# Patient Record
Sex: Female | Born: 1955
Health system: Southern US, Community
[De-identification: ages and names within clinical notes are randomized; demographics above are authoritative.]

## PROBLEM LIST (undated history)

## (undated) DIAGNOSIS — Z923 Personal history of irradiation: Secondary | ICD-10-CM

## (undated) DIAGNOSIS — Z8489 Family history of other specified conditions: Secondary | ICD-10-CM

## (undated) DIAGNOSIS — F419 Anxiety disorder, unspecified: Secondary | ICD-10-CM

## (undated) DIAGNOSIS — R3915 Urgency of urination: Secondary | ICD-10-CM

## (undated) DIAGNOSIS — E119 Type 2 diabetes mellitus without complications: Secondary | ICD-10-CM

## (undated) DIAGNOSIS — G709 Myoneural disorder, unspecified: Secondary | ICD-10-CM

## (undated) DIAGNOSIS — I10 Essential (primary) hypertension: Secondary | ICD-10-CM

## (undated) DIAGNOSIS — Z9221 Personal history of antineoplastic chemotherapy: Secondary | ICD-10-CM

## (undated) DIAGNOSIS — C50919 Malignant neoplasm of unspecified site of unspecified female breast: Secondary | ICD-10-CM

## (undated) DIAGNOSIS — J45909 Unspecified asthma, uncomplicated: Secondary | ICD-10-CM

## (undated) DIAGNOSIS — L659 Nonscarring hair loss, unspecified: Secondary | ICD-10-CM

## (undated) DIAGNOSIS — C801 Malignant (primary) neoplasm, unspecified: Secondary | ICD-10-CM

## (undated) HISTORY — PX: DILATION AND CURETTAGE OF UTERUS: SHX78

## (undated) HISTORY — PX: AXILLARY LYMPH NODE BIOPSY: SHX5737

## (undated) HISTORY — PX: COLONOSCOPY: SHX174

## (undated) HISTORY — PX: ABDOMINAL HYSTERECTOMY: SHX81

---

## 2004-02-09 ENCOUNTER — Encounter: Admission: RE | Admit: 2004-02-09 | Discharge: 2004-02-09 | Payer: Self-pay | Admitting: Family Medicine

## 2004-02-16 ENCOUNTER — Ambulatory Visit (HOSPITAL_COMMUNITY): Admission: RE | Admit: 2004-02-16 | Discharge: 2004-02-16 | Payer: Self-pay | Admitting: Gastroenterology

## 2004-03-02 ENCOUNTER — Ambulatory Visit (HOSPITAL_COMMUNITY): Admission: RE | Admit: 2004-03-02 | Discharge: 2004-03-02 | Payer: Self-pay | Admitting: General Surgery

## 2004-03-15 ENCOUNTER — Ambulatory Visit (HOSPITAL_COMMUNITY): Admission: RE | Admit: 2004-03-15 | Discharge: 2004-03-15 | Payer: Self-pay | Admitting: Family Medicine

## 2004-04-14 ENCOUNTER — Encounter: Admission: RE | Admit: 2004-04-14 | Discharge: 2004-05-06 | Payer: Self-pay | Admitting: General Surgery

## 2004-07-21 ENCOUNTER — Encounter: Admission: RE | Admit: 2004-07-21 | Discharge: 2004-07-21 | Payer: Self-pay | Admitting: General Surgery

## 2004-12-21 ENCOUNTER — Encounter: Admission: RE | Admit: 2004-12-21 | Discharge: 2004-12-21 | Payer: Self-pay | Admitting: Family Medicine

## 2005-05-20 ENCOUNTER — Encounter: Admission: RE | Admit: 2005-05-20 | Discharge: 2005-05-20 | Payer: Self-pay | Admitting: Family Medicine

## 2005-06-02 ENCOUNTER — Ambulatory Visit (HOSPITAL_COMMUNITY): Admission: RE | Admit: 2005-06-02 | Discharge: 2005-06-02 | Payer: Self-pay | Admitting: Family Medicine

## 2010-07-24 ENCOUNTER — Encounter: Payer: Self-pay | Admitting: General Surgery

## 2011-02-09 ENCOUNTER — Emergency Department: Payer: Self-pay | Admitting: Internal Medicine

## 2014-05-14 ENCOUNTER — Other Ambulatory Visit: Payer: Self-pay | Admitting: Physician Assistant

## 2014-05-14 DIAGNOSIS — R2231 Localized swelling, mass and lump, right upper limb: Secondary | ICD-10-CM

## 2014-05-14 DIAGNOSIS — N6310 Unspecified lump in the right breast, unspecified quadrant: Secondary | ICD-10-CM

## 2014-05-23 ENCOUNTER — Other Ambulatory Visit: Payer: Self-pay

## 2014-05-26 ENCOUNTER — Other Ambulatory Visit: Payer: Self-pay | Admitting: Physician Assistant

## 2014-05-26 DIAGNOSIS — N6310 Unspecified lump in the right breast, unspecified quadrant: Secondary | ICD-10-CM

## 2014-05-28 ENCOUNTER — Encounter (HOSPITAL_COMMUNITY): Payer: Self-pay

## 2014-05-28 ENCOUNTER — Ambulatory Visit
Admission: RE | Admit: 2014-05-28 | Discharge: 2014-05-28 | Disposition: A | Discharge status: Home / Self Care | Payer: No Typology Code available for payment source | Source: Ambulatory Visit | Attending: Physician Assistant | Admitting: Physician Assistant

## 2014-05-28 ENCOUNTER — Ambulatory Visit (HOSPITAL_COMMUNITY)
Admission: RE | Admit: 2014-05-28 | Discharge: 2014-05-28 | Disposition: A | Discharge status: Home / Self Care | Payer: Medicaid Other | Source: Ambulatory Visit | Attending: Obstetrics and Gynecology | Admitting: Obstetrics and Gynecology

## 2014-05-28 ENCOUNTER — Other Ambulatory Visit: Payer: Self-pay | Admitting: Physician Assistant

## 2014-05-28 VITALS — BP 142/90 | Ht 67.0 in | Wt 245.4 lb

## 2014-05-28 DIAGNOSIS — R2231 Localized swelling, mass and lump, right upper limb: Secondary | ICD-10-CM

## 2014-05-28 DIAGNOSIS — N6310 Unspecified lump in the right breast, unspecified quadrant: Secondary | ICD-10-CM

## 2014-05-28 DIAGNOSIS — N6315 Unspecified lump in the right breast, overlapping quadrants: Secondary | ICD-10-CM

## 2014-05-28 DIAGNOSIS — Z1239 Encounter for other screening for malignant neoplasm of breast: Secondary | ICD-10-CM

## 2014-05-28 HISTORY — DX: Type 2 diabetes mellitus without complications: E11.9

## 2014-05-28 HISTORY — DX: Essential (primary) hypertension: I10

## 2014-05-28 NOTE — Progress Notes (Signed)
Complaints of right breast lump x 2 weeks that was sore when first noticed. Patient referred to [facility] by the [facility] due to recommending a right breast biopsy.  Pap Smear:  Pap smear not completed today. Last Pap smear was 10 years ago and normal per patient. Per patient had an abnormal Pap smear 20 years ago. Patient has a history of a complete hysterectomy 20 years after the abnormal Pap smear. Per patient had a history of AUB. Patient not sure if the hysterectomy was related to AUB or abnormal Pap smear. No Pap smear results in EPIC.  Physical exam: Breasts Breasts symmetrical. No skin abnormalities bilateral breasts. No nipple retraction bilateral breasts. No nipple discharge bilateral breasts. No lymphadenopathy left axilla. Lymphadenopathy right axilla. No lumps palpated left breast. Palpated a lump within the right breast at 12 o'clock above the areola and a lump within the right axilla. Complaints of right outer breast and axillary tenderness on exam. Referred patient to the [facility] for right breast biopsy per recommendation. Appointment scheduled for Wednesday, May 28, 2014 at 1000.      Pelvic/Bimanual No Pap smear completed today since patient has a history of a hysteretomy. Pap smear not indicated per BCCCP guidelines.

## 2014-05-28 NOTE — Patient Instructions (Signed)
Explained to Marissa Haney that she did not need a Pap smear today due to last Pap smear due to her history of a hysterectomy. Referred patient to the [facility] for right breast biopsy per recommendation. Appointment scheduled for Wednesday, May 28, 2014 at 1000. Patient aware of appointment and will be there. Marissa Haney verbalized understanding.  Marissa Haney, Arvil Chaco, RN 8:34 AM

## 2014-06-02 ENCOUNTER — Other Ambulatory Visit: Payer: Self-pay | Admitting: Physician Assistant

## 2014-06-02 DIAGNOSIS — T1590XA Foreign body on external eye, part unspecified, unspecified eye, initial encounter: Secondary | ICD-10-CM

## 2014-06-02 DIAGNOSIS — C50911 Malignant neoplasm of unspecified site of right female breast: Secondary | ICD-10-CM

## 2014-06-05 ENCOUNTER — Other Ambulatory Visit (INDEPENDENT_AMBULATORY_CARE_PROVIDER_SITE_OTHER): Payer: Self-pay

## 2014-06-05 DIAGNOSIS — C50911 Malignant neoplasm of unspecified site of right female breast: Secondary | ICD-10-CM

## 2014-06-06 ENCOUNTER — Telehealth: Payer: Self-pay | Admitting: *Deleted

## 2014-06-06 NOTE — Telephone Encounter (Signed)
Received referral from [facility].  Called pt and confirmed 06/11/14 appt w/ her.  Unable to mail before appt letter - gave verbal.  Unable to mail welcoming packet - gave instructions and directions.  Unable to mail intake form - placed a note for one to be given at time of check in.  Emailed [place] and [place] at [employer] to make them aware.  Added to spreadsheet.

## 2014-06-10 ENCOUNTER — Other Ambulatory Visit: Payer: Self-pay

## 2014-06-11 ENCOUNTER — Ambulatory Visit: Payer: Self-pay | Admitting: Hematology and Oncology

## 2014-06-11 ENCOUNTER — Ambulatory Visit: Payer: Self-pay

## 2014-06-11 ENCOUNTER — Telehealth: Payer: Self-pay | Admitting: *Deleted

## 2014-06-11 DIAGNOSIS — C50411 Malignant neoplasm of upper-outer quadrant of right female breast: Secondary | ICD-10-CM | POA: Insufficient documentation

## 2014-06-11 NOTE — Telephone Encounter (Signed)
Dr. Lindi Adie informed me that Marissa Haney did not show for her appt.  Called Marissa Haney and she is sick and thought that [place] would let us know since she changed the MRI appt.  Rescheduled and confirmed 06/17/14 appt w/ Marissa Haney.  Informed Dr. Lindi Adie.

## 2014-06-11 NOTE — Assessment & Plan Note (Signed)
Right breast invasive ductal carcinoma involving right axillary lymph node: 5.4 cm palpable mass T3, N1, M0 stage IIIa clinical stage ER 100%, PR 97%, HER-2 negative, Ki-67 22% grade 1/2 with lymphovascular invasion  Pathology and radiology review:Discussed with the patient, the details of pathology including the type of breast cancer,the clinical staging, the significance of ER, PR and HER-2/neu receptors and the implications for treatment. After reviewing the pathology in detail, we proceeded to discuss the different treatment options between surgery, radiation, chemotherapy, antiestrogen therapies.  Recommendation: Breast MRI has been scheduled for 06/13/2014. We discussed her case in the multidisciplinary tumor board. I recommended neoadjuvant chemotherapy followed by surgery and adjuvant radiation. I recommended neoadjuvant dose dense Adriamycin and Cytoxan x4 followed by weekly Taxol x12  Chemotherapy counseling: I have discussed the risks and benefits of chemotherapy including the risks of nausea/ vomiting, risk of infection from low WBC count, fatigue due to chemo or anemia, bruising or bleeding due to low platelets, mouth sores, loss/ change in taste and decreased appetite. Liver and kidney function will be monitored through out chemotherapy as abnormalities in liver and kidney function may be a side effect of treatment. Cardiac dysfunction due to Adriamycin was discussed in detail. Risk of permanent bone marrow dysfunction and leukemia due to chemo were also discussed.  Plan: 1. MRI Breast 2. Port 3. ECHO 4. Chemo class 5. Information regarding PREVENT trial and ALLIANCE trial  PREVENT trial: [id number] [phone]  Newly diagnosed breast cancer Stages 1-3 scheduled to receive anthracycline randomized to atorvastatin 40 mg or placebo once daily for 24 months along with 3 cardiac MRIs or 2 years paid for by the trial. I discussed the risks and benefits of atorvastatin including the risk of myopathy  and elevation of liver function tests. I explained the randomization process and patient is interested in the trial. I provided her with literature to read and provided her with the contact with the clinical trials nurse to ask any questions regarding the trial.  ALLIANCE [id number]: Phase 3 clinical trial in patients with node positive disease after neoadjuvant chemotherapy, if patient is positive for sentinel lymph node determined by intraoperative pathology on final pathology if axillary lymph node dissection was not performed, randomized to axillary lymph node dissection with nodal radiation versus axillary radiation and nodal radiation

## 2014-06-13 ENCOUNTER — Ambulatory Visit
Admission: RE | Admit: 2014-06-13 | Discharge: 2014-06-13 | Disposition: A | Discharge status: Home / Self Care | Payer: No Typology Code available for payment source | Source: Ambulatory Visit | Attending: Physician Assistant | Admitting: Physician Assistant

## 2014-06-13 DIAGNOSIS — T1590XA Foreign body on external eye, part unspecified, unspecified eye, initial encounter: Secondary | ICD-10-CM

## 2014-06-13 DIAGNOSIS — C50911 Malignant neoplasm of unspecified site of right female breast: Secondary | ICD-10-CM

## 2014-06-13 MED ORDER — GADOBENATE DIMEGLUMINE 529 MG/ML IV SOLN: 20.0000 mL | Freq: Once | INTRAVENOUS | Status: AC | PRN

## 2014-06-17 ENCOUNTER — Telehealth: Payer: Self-pay | Admitting: Hematology and Oncology

## 2014-06-17 ENCOUNTER — Encounter (HOSPITAL_COMMUNITY): Payer: Self-pay | Admitting: *Deleted

## 2014-06-17 ENCOUNTER — Encounter: Payer: Self-pay | Admitting: Hematology and Oncology

## 2014-06-17 ENCOUNTER — Ambulatory Visit (HOSPITAL_BASED_OUTPATIENT_CLINIC_OR_DEPARTMENT_OTHER): Payer: Medicaid Other | Admitting: Hematology and Oncology

## 2014-06-17 ENCOUNTER — Ambulatory Visit: Payer: Self-pay

## 2014-06-17 ENCOUNTER — Other Ambulatory Visit (INDEPENDENT_AMBULATORY_CARE_PROVIDER_SITE_OTHER): Payer: Self-pay | Admitting: General Surgery

## 2014-06-17 DIAGNOSIS — C773 Secondary and unspecified malignant neoplasm of axilla and upper limb lymph nodes: Secondary | ICD-10-CM

## 2014-06-17 DIAGNOSIS — Z170 Estrogen receptor positive status [ER+]: Secondary | ICD-10-CM

## 2014-06-17 DIAGNOSIS — C50411 Malignant neoplasm of upper-outer quadrant of right female breast: Secondary | ICD-10-CM

## 2014-06-17 NOTE — Progress Notes (Signed)
[facility] CONSULT NOTE  Patient Care Team: Cyndi Bender, PA-C as PCP - General (Physician Assistant)  CHIEF COMPLAINTS/PURPOSE OF CONSULTATION:  Newly diagnosed breast cancer  HISTORY OF PRESENTING ILLNESS:  Marissa Haney 58 y.o. female is here because of recent diagnosis of right-sided breast cancer. Patient had a palpable right breast lump that was initially treated with antibiotic and then sent her to mammogram that revealed a large mass 5.4 cm in size. She underwent a biopsy 05/28/2014 which came back as invasive ductal carcinoma with lymphovascular invasion is ER/PR positive HER-2 negative. Lymph node in the right axilla was also biopsy proven to be breast cancer. She underwent a breast MRI in 06/16/2014 that revealed 3.6 cm mass in the right breast with enlarged right axillary lymph node in addition there was a 5 mm oval enhancing mass in the central left breast thought to be an intramammary lymph node. Patient had seen Dr. Dalbert Batman with general surgery. She was presented the multidisciplinary tumor board and she is here today to discuss neoadjuvant treatment option.  I reviewed her records extensively and collaborated the history with the patient.  SUMMARY OF ONCOLOGIC HISTORY:   Breast cancer of upper-outer quadrant of right female breast   05/27/2014 Mammogram Right breast mass by mammogram 5.4 cm by ultrasound 4 x 4 X 2 cm   05/28/2014 Initial Biopsy Right breast biopsy 11:30: Invasive ductal carcinoma with lymphovascular invasion grade 1/2, ER 100%, PR 97%, Ki-67 22%, HER-2 negative ratio 1.26 average copy #1.7, lymph node biopsy also positive   06/16/2014 Breast MRI right breast cancer: 3.6 cm at 12:00 position mildly enlarged right axillary lymph node: 5 mm oval enhancing mass in the central left breast could be an intramammary lymph node    In terms of breast cancer risk profile:  She menarched at early age of 27 and went to menopause at age 35 She had 4  pregnancy, her first child was born at age 9   She has not received birth control pills She was never exposed to fertility medications or hormone replacement therapy.  She has no family history of Breast/GYN/GI cancer  Her sister-in-law was treated for breast cancer at [facility] with surgery followed by adjuvant chemotherapy  MEDICAL HISTORY:  Past Medical History  Diagnosis Date  . Hypertension   . Diabetes mellitus without complication     SURGICAL HISTORY: Past Surgical History  Procedure Laterality Date  . Abdominal hysterectomy    . Axillary lymph node biopsy      SOCIAL HISTORY: History   Social History  . Marital Status: Single    Spouse Name: N/A    Number of Children: N/A  . Years of Education: N/A   Occupational History  . Not on file.   Social History Main Topics  . Smoking status: Never Smoker   . Smokeless tobacco: Never Used  . Alcohol Use: No     Comment: ocassionally  . Drug Use: No  . Sexual Activity: Not Currently    Birth Control/ Protection: Surgical   Other Topics Concern  . Not on file   Social History Narrative    FAMILY HISTORY: Family History  Problem Relation Age of Onset  . Hypertension Mother     ALLERGIES:  has No Known Allergies.  MEDICATIONS:  Current Outpatient Prescriptions  Medication Sig Dispense Refill  . ALPRAZolam (XANAX) 0.25 MG tablet Take 0.25 mg by mouth 2 (two) times daily as needed for anxiety.    Marland Kitchen glipiZIDE (  GLUCOTROL) 5 MG tablet Take 5 mg by mouth 2 (two) times daily before a meal.    . meclizine (ANTIVERT) 25 MG tablet Take 25 mg by mouth.    . metFORMIN (GLUCOPHAGE) 500 MG tablet Take 500 mg by mouth.    Marland Kitchen lisinopril (PRINIVIL,ZESTRIL) 10 MG tablet Take 10 mg by mouth daily.     No current facility-administered medications for this visit.    REVIEW OF SYSTEMS:   Constitutional: Denies fevers, chills or abnormal night sweats Eyes: Denies blurriness of vision, double vision or watery eyes Ears,  nose, mouth, throat, and face: Denies mucositis or sore throat Respiratory: Denies cough, dyspnea or wheezes Cardiovascular: Denies palpitation, chest discomfort or lower extremity swelling Gastrointestinal:  Denies nausea, heartburn or change in bowel habits Skin: Denies abnormal skin rashes Lymphatics: Denies new lymphadenopathy or easy bruising Neurological:Denies numbness, tingling or new weaknesses Behavioral/Psych: Mood is stable, no new changes  Breast: Palpable lump right breast All other systems were reviewed with the patient and are negative.  PHYSICAL EXAMINATION: ECOG PERFORMANCE STATUS: 1 - Symptomatic but completely ambulatory  Filed Vitals:   06/17/14 1303  BP: 187/72  Pulse: 72  Temp: 98.5 F (36.9 C)  Resp: 18   Filed Weights   06/17/14 1303  Weight: 244 lb (110.678 kg)    GENERAL:alert, no distress and comfortable SKIN: skin color, texture, turgor are normal, no rashes or significant lesions EYES: normal, conjunctiva are pink and non-injected, sclera clear OROPHARYNX:no exudate, no erythema and lips, buccal mucosa, and tongue normal  NECK: supple, thyroid normal size, non-tender, without nodularity LYMPH:  no palpable lymphadenopathy in the cervical, axillary or inguinal LUNGS: clear to auscultation and percussion with normal breathing effort HEART: regular rate & rhythm and no murmurs and no lower extremity edema ABDOMEN:abdomen soft, non-tender and normal bowel sounds Musculoskeletal:no cyanosis of digits and no clubbing  PSYCH: alert & oriented x 3 with fluent speech NEURO: no focal motor/sensory deficits BREAST: Very large palpable lump in the right breast upper outer quadrant. No palpable axillary or supraclavicular lymphadenopathy  LABORATORY DATA:  I have reviewed the data as listed No results found for: WBC, HGB, HCT, MCV, PLT No results found for: NA, K, CL, CO2  RADIOGRAPHIC STUDIES: I have personally reviewed the radiological reports and  agreed with the findings in the report.  ASSESSMENT AND PLAN:  Breast cancer of upper-outer quadrant of right female breast  Right breast invasive ductal carcinoma 3.6 cm by MRI with enlarged right axillary lymph node biopsy proven to be positive for cancer , T2 N1 M0 stage IIB ER positive PR positive HER-2 negative Ki-67 22%  Pathology and radiology counseling:Discussed with the patient, the details of pathology including the type of breast cancer,the clinical staging, the significance of ER, PR and HER-2/neu receptors and the implications for treatment. After reviewing the pathology in detail, we proceeded to discuss the different treatment options between surgery, radiation, chemotherapy, antiestrogen therapies.  Recommendation: Based on tumor board discussion, we recommend neoadjuvant chemotherapy followed by surgery followed by radiation followed by antiestrogen therapy Chemotherapy: Dose dense Adriamycin and Cytoxan every 2 weeks 4 followed by Taxol weekly 12  Chemotherapy counseling:I have discussed the risks and benefits of chemotherapy including the risks of nausea/ vomiting, risk of infection from low WBC count, fatigue due to chemo or anemia, bruising or bleeding due to low platelets, mouth sores, loss/ change in taste and decreased appetite. Liver and kidney function will be monitored through out chemotherapy as abnormalities in liver  and kidney function may be a side effect of treatment. Cardiac dysfunction due to Adriamycin was discussed in detail. Risk of permanent bone marrow dysfunction and leukemia due to chemo were also discussed.  PREVENT trial: [facility] [id number]  I discussed with the patient the opportunity to participate in this trial. Newly diagnosed breast cancer Stages 1-3 scheduled to receive anthracycline randomized to atorvastatin 40 mg or placebo once daily for 24 months along with 3 cardiac MRIs or 2 years paid for by the trial. I discussed the risks and benefits of  atorvastatin including the risk of myopathy and elevation of liver function tests. I explained the randomization process and patient is interested in the trial. I provided her with literature to read and provided her with the contact with the clinical trials nurse to ask any questions regarding the trial.  Plan: 1. Port placement: Will request Dr. Dalbert Batman 2. Echocardiogram pre-Adriamycin chemotherapy 3. Chemotherapy class  Tentatively plan for chemotherapy on January 7.   All questions were answered. The patient knows to call the clinic with any problems, questions or concerns. I spent 55 minutes counseling the patient face to face. The total time spent in the appointment was 60 minutes and more than 50% was on counseling.     Rulon Eisenmenger, MD 06/17/2014 2:48 PM

## 2014-06-17 NOTE — Progress Notes (Signed)
Research contacted to connect with patient re: PRevent Trial.

## 2014-06-17 NOTE — Progress Notes (Signed)
Checked in new pt with no insurance.  Pt is approved with BCCCP until 05/29/15.  She is supposed to meet with Rolena Infante to fill out a Medicaid application.  I left vm for Sabrina letting her know the pt is here.

## 2014-06-17 NOTE — Assessment & Plan Note (Addendum)
Right breast invasive ductal carcinoma 3.6 cm by MRI with enlarged right axillary lymph node biopsy proven to be positive for cancer , T2 N1 M0 stage IIB ER positive PR positive HER-2 negative Ki-67 22%  Pathology and radiology counseling:Discussed with the patient, the details of pathology including the type of breast cancer,the clinical staging, the significance of ER, PR and HER-2/neu receptors and the implications for treatment. After reviewing the pathology in detail, we proceeded to discuss the different treatment options between surgery, radiation, chemotherapy, antiestrogen therapies.  Recommendation: Based on tumor board discussion, we recommend neoadjuvant chemotherapy followed by surgery followed by radiation followed by antiestrogen therapy Chemotherapy: Dose dense Adriamycin and Cytoxan every 2 weeks 4 followed by Taxol weekly 12  Chemotherapy counseling:I have discussed the risks and benefits of chemotherapy including the risks of nausea/ vomiting, risk of infection from low WBC count, fatigue due to chemo or anemia, bruising or bleeding due to low platelets, mouth sores, loss/ change in taste and decreased appetite. Liver and kidney function will be monitored through out chemotherapy as abnormalities in liver and kidney function may be a side effect of treatment. Cardiac dysfunction due to Adriamycin was discussed in detail. Risk of permanent bone marrow dysfunction and leukemia due to chemo were also discussed.  PREVENT trial: [facility] [id number]  I discussed with the patient the opportunity to participate in this trial. Newly diagnosed breast cancer Stages 1-3 scheduled to receive anthracycline randomized to atorvastatin 40 mg or placebo once daily for 24 months along with 3 cardiac MRIs or 2 years paid for by the trial. I discussed the risks and benefits of atorvastatin including the risk of myopathy and elevation of liver function tests. I explained the randomization process and patient is  interested in the trial. I provided her with literature to read and provided her with the contact with the clinical trials nurse to ask any questions regarding the trial.  Plan: 1. Port placement: Will request Dr. Dalbert Batman 2. Echocardiogram pre-Adriamycin chemotherapy 3. Chemotherapy class  Tentatively plan for chemotherapy on January 7.

## 2014-06-17 NOTE — Telephone Encounter (Signed)
per pof to sch pt appt-sent MW email to sch pt trmt-cld Christine  to inq about the BCCCP card-adv pt to come sign Mc[facility] papers w/Sabena-stated Mcaid would go back to retro-sch [facility] pt copy of sch

## 2014-06-17 NOTE — Progress Notes (Signed)
Patient ID: Marissa Haney, female   DOB: 08-13-1955, 58 y.o.   MRN: [id number] Medicaid Paperwork completed and faxed

## 2014-06-18 ENCOUNTER — Other Ambulatory Visit: Payer: Self-pay

## 2014-06-18 ENCOUNTER — Encounter (HOSPITAL_BASED_OUTPATIENT_CLINIC_OR_DEPARTMENT_OTHER): Payer: Self-pay | Admitting: *Deleted

## 2014-06-18 ENCOUNTER — Encounter (HOSPITAL_BASED_OUTPATIENT_CLINIC_OR_DEPARTMENT_OTHER)
Admission: RE | Admit: 2014-06-18 | Discharge: 2014-06-18 | Disposition: A | Discharge status: Home / Self Care | Payer: Medicaid Other | Source: Ambulatory Visit | Attending: General Surgery | Admitting: General Surgery

## 2014-06-18 ENCOUNTER — Ambulatory Visit
Admission: RE | Admit: 2014-06-18 | Discharge: 2014-06-18 | Disposition: A | Discharge status: Home / Self Care | Payer: No Typology Code available for payment source | Source: Ambulatory Visit | Attending: General Surgery | Admitting: General Surgery

## 2014-06-18 DIAGNOSIS — Z6837 Body mass index (BMI) 37.0-37.9, adult: Secondary | ICD-10-CM | POA: Diagnosis not present

## 2014-06-18 DIAGNOSIS — E119 Type 2 diabetes mellitus without complications: Secondary | ICD-10-CM | POA: Diagnosis not present

## 2014-06-18 DIAGNOSIS — Z01810 Encounter for preprocedural cardiovascular examination: Secondary | ICD-10-CM

## 2014-06-18 DIAGNOSIS — I10 Essential (primary) hypertension: Secondary | ICD-10-CM | POA: Diagnosis not present

## 2014-06-18 DIAGNOSIS — Z836 Family history of other diseases of the respiratory system: Secondary | ICD-10-CM | POA: Diagnosis not present

## 2014-06-18 DIAGNOSIS — R001 Bradycardia, unspecified: Secondary | ICD-10-CM

## 2014-06-18 DIAGNOSIS — Z6841 Body Mass Index (BMI) 40.0 and over, adult: Secondary | ICD-10-CM | POA: Diagnosis not present

## 2014-06-18 DIAGNOSIS — C50411 Malignant neoplasm of upper-outer quadrant of right female breast: Secondary | ICD-10-CM | POA: Diagnosis present

## 2014-06-18 DIAGNOSIS — Z8249 Family history of ischemic heart disease and other diseases of the circulatory system: Secondary | ICD-10-CM | POA: Diagnosis not present

## 2014-06-18 LAB — COMPREHENSIVE METABOLIC PANEL
ALT: 22 U/L (ref 0–35)
AST: 15 U/L (ref 0–37)
Albumin: 3.5 g/dL (ref 3.5–5.2)
Alkaline Phosphatase: 138 U/L — ABNORMAL HIGH (ref 39–117)
Anion gap: 12 (ref 5–15)
BUN: 11 mg/dL (ref 6–23)
CALCIUM: 9 mg/dL (ref 8.4–10.5)
CO2: 26 mEq/L (ref 19–32)
CREATININE: 0.67 mg/dL (ref 0.50–1.10)
Chloride: 105 mEq/L (ref 96–112)
GFR calc non Af Amer: >90 mL/min (ref >90)
GLUCOSE: 108 mg/dL — AB (ref 70–99)
Potassium: 3.6 mEq/L — ABNORMAL LOW (ref 3.7–5.3)
Sodium: 143 mEq/L (ref 137–147)
Total Bilirubin: 0.5 mg/dL (ref 0.3–1.2)
Total Protein: 7.7 g/dL (ref 6.0–8.3)

## 2014-06-18 LAB — CBC WITH DIFFERENTIAL/PLATELET
Basophils Absolute: 0 10*3/uL (ref 0.0–0.1)
Basophils Relative: 0 % (ref 0–1)
EOS PCT: 3 % (ref 0–5)
Eosinophils Absolute: 0.4 10*3/uL (ref 0.0–0.7)
HCT: 38 % (ref 36.0–46.0)
Hemoglobin: 12.6 g/dL (ref 12.0–15.0)
LYMPHS ABS: 4.9 10*3/uL — AB (ref 0.7–4.0)
Lymphocytes Relative: 41 % (ref 12–46)
MCH: 30.5 pg (ref 26.0–34.0)
MCHC: 33.2 g/dL (ref 30.0–36.0)
MCV: 92 fL (ref 78.0–100.0)
Monocytes Absolute: 0.6 10*3/uL (ref 0.1–1.0)
Monocytes Relative: 5 % (ref 3–12)
Neutro Abs: 6.1 10*3/uL (ref 1.7–7.7)
Neutrophils Relative %: 51 % (ref 43–77)
Platelets: 240 10*3/uL (ref 150–400)
RBC: 4.13 MIL/uL (ref 3.87–5.11)
RDW: 12.8 % (ref 11.5–15.5)
WBC: 12 10*3/uL — ABNORMAL HIGH (ref 4.0–10.5)

## 2014-06-18 NOTE — Progress Notes (Signed)
   06/18/14 0951  OBSTRUCTIVE SLEEP APNEA  Have you ever been diagnosed with sleep apnea through a sleep study? No  Do you snore loudly (loud enough to be heard through closed doors)?  1  Do you often feel tired, fatigued, or sleepy during the daytime? 0  Has anyone observed you stop breathing during your sleep? 0  Do you have, or are you being treated for high blood pressure? 1  BMI more than 35 kg/m2? 1  Age over 59 years old? 1  Neck circumference greater than 40 cm/16 inches? 1  Gender: 0  Obstructive Sleep Apnea Score 5  Score 4 or greater  Results sent to PCP

## 2014-06-18 NOTE — H&P (Signed)
Marissa Haney  Location: [facility] Patient #: [id number] DOB: 06-Nov-1955 Single / Language: Cleophus Molt / Race: Black or African American Female       History of Present Illness  Patient words: breast eval.  The patient is a 58 year old female who presents with breast cancer. This is a pleasant 58 year old Afro-American female from [place]. She was referred by Dr. Lovey Newcomer at the [facility] for initial surgical evaluation of a locally advanced cancer of the right breast. Cyndi Bender, PA, in [place] provides her primary care. She has not had any breast disease in the past. She recently woke up and felt a lump and a little bit of discomfort in the upper central right breast. She saw her PCP and was referred for mammography at [facility]. This revealed a 5 cm mass in the 12 o'clock position of the right breast which was also palpable with slight skin thickening on mammogram. Multiple right axillary masses were noted and were suspicious for metastatic disease to the right axillary lymph nodes. There was no evidence of malignancy in the left breast. She was referred to the breast center of [facility] and recently underwent biopsy of the primary mass in the right breast and one of the right axillary lymph nodes by Lovey Newcomer. . Both of these biopsies showed invasive ductal carcinoma with lymphovascular invasion. Breast diagnostic profile is pending. MRI is scheduled for December 8. She is definitely interested in breast conservation, if this is feasible. Past history is significant for excisional biopsy of a right axillary lymph node many years ago. She thinks I did this. This was benign. She had TAH and BSO 25 years ago for abnormal Pap smear but there was no cancer. BMI is 37. She has hypertension and type 2 diabetes. Family history is negative for breast or ovarian cancer. Mother died of congestive heart failure and father died of  complications of alcoholism The patient is single. She has a daughter age 52. She works part-time in [employer]. Denies tobacco. We had a long talk about her breast cancer specifically and treatment of breast cancer in general.  I told her that she needed to have the MRI next week.  We need to get the breast diagnostic profile.  She is to see medical oncology and possibly radiation oncology next week. I told her it might be possible to perform breast conservation surgery following neoadjuvant chemotherapy but we would have to wait and see what the MRI shows. She knows that if she has multifocal disease she would ultimately need a mastectomy.  I told her she may need radiation therapy regardless of the extent of surgery. I told her that if she was going to get chemotherapy that we would place a Port-A-Cath and we discussed that for some time as well. She seems to understand all of these issues fairly well and is willing to proceed with all planned investigations and consultations. I will see her back in 10-14 days after she has seen medical oncology.   Other Problems  Breast Cancer High blood pressure Lump In Breast  Past Surgical History  Breast Biopsy Right.  Diagnostic Studies History  Colonoscopy 5-10 years ago Mammogram within last year Pap Smear >5 years ago  Allergies  No Known Drug Allergies12/09/2013  Medication History  ALPRAZolam (0.25MG  Tablet, Oral) Active. GlipiZIDE (5MG  Tablet, Oral) Active. Lisinopril (10MG  Tablet, Oral) Active.  Social History  Alcohol use Occasional alcohol use. Illicit drug use  Prefer to discuss with provider. No caffeine use Tobacco use Never smoker.  Family History  Heart Disease Mother. Respiratory Condition Mother.  Pregnancy / Birth History  Age at menarche 19 years. Gravida 4 Irregular periods Maternal age 86-20 Para 3  Review of Systems General Present- Chills, Night Sweats and  Weight Loss. Not Present- Appetite Loss, Fatigue, Fever and Weight Gain. HEENT Present- Ringing in the Ears. Not Present- Earache, Hearing Loss, Hoarseness, Nose Bleed, Oral Ulcers, Seasonal Allergies, Sinus Pain, Sore Throat, Visual Disturbances, Wears glasses/contact lenses and Yellow Eyes. Respiratory Not Present- Bloody sputum, Chronic Cough, Difficulty Breathing, Snoring and Wheezing. Breast Present- Breast Mass. Not Present- Breast Pain, Nipple Discharge and Skin Changes. Cardiovascular Present- Shortness of Breath. Not Present- Chest Pain, Difficulty Breathing Lying Down, Leg Cramps, Palpitations, Rapid Heart Rate and Swelling of Extremities. Gastrointestinal Present- Nausea. Not Present- Abdominal Pain, Bloating, Bloody Stool, Change in Bowel Habits, Chronic diarrhea, Constipation, Difficulty Swallowing, Excessive gas, Gets full quickly at meals, Hemorrhoids, Indigestion, Rectal Pain and Vomiting. Female Genitourinary Not Present- Frequency, Nocturia, Painful Urination, Pelvic Pain and Urgency. Musculoskeletal Not Present- Back Pain, Joint Pain, Joint Stiffness, Muscle Pain, Muscle Weakness and Swelling of Extremities. Neurological Not Present- Decreased Memory, Fainting, Headaches, Numbness, Seizures, Tingling, Tremor, Trouble walking and Weakness. Psychiatric Present- Frequent crying. Not Present- Anxiety, Bipolar, Change in Sleep Pattern, Depression and Fearful. Endocrine Present- Hot flashes and New Diabetes. Not Present- Cold Intolerance, Excessive Hunger, Hair Changes and Heat Intolerance. Hematology Not Present- Easy Bruising, Excessive bleeding, Gland problems, HIV and Persistent Infections.   Vitals  06/05/2014 11:45 AM Weight: 239 lb Height: 67in Body Surface Area: 2.26 m Body Mass Index: 37.43 kg/m Temp.: 97.56F(Temporal)  Pulse: 75 (Regular)  BP: 132/76 (Sitting, Left Arm, Standard)    Physical Exam  General Mental Status-Alert. General  Appearance-Consistent with stated age. Hydration-Well hydrated. Voice-Normal. Note: BMI 37   Head and Neck Head-normocephalic, atraumatic with no lesions or palpable masses. Trachea-midline. Thyroid Gland Characteristics - normal size and consistency.  Eye Eyeball - Bilateral-Extraocular movements intact. Sclera/Conjunctiva - Bilateral-No scleral icterus.  Chest and Lung Exam Chest and lung exam reveals -quiet, even and easy respiratory effort with no use of accessory muscles and on auscultation, normal breath sounds, no adventitious sounds and normal vocal resonance. Inspection Chest Wall - Normal. Back - normal.  Breast Breast - Left-Symmetric, Non Tender, No Biopsy scars, no Dimpling, No Inflammation, No Lumpectomy scars, No Mastectomy scars, No Peau d' Orange. Breast - Right-Biopsy scar, Non Tender, No Dimpling, No Inflammation, No Lumpectomy scars, No Mastectomy scars, No Peau d' Orange. Note: At the 12 o'clock position, above the areolar margin there is a palpable mass, at least 5 cm transversely. The skin is healthy. There are minor ecchymoses. There is a scar in the right axilla from remote benign lymph node biopsy. I can't really feel the pathologically enlarged lymph nodes.  Cardiovascular Cardiovascular examination reveals -normal heart sounds, regular rate and rhythm with no murmurs and normal pedal pulses bilaterally.  Abdomen Inspection Inspection of the abdomen reveals - No Hernias. Skin - Scar - no surgical scars. Palpation/Percussion Palpation and Percussion of the abdomen reveal - Soft, Non Tender, No Rebound tenderness, No Rigidity (guarding) and No hepatosplenomegaly. Auscultation Auscultation of the abdomen reveals - Bowel sounds normal. Note: Well-healed Pfannenstiel incision.   Neurologic Neurologic evaluation reveals -alert and oriented x 3 with no impairment of recent or remote memory. Mental  Status-Normal.  Musculoskeletal Normal Exam - Left-Upper Extremity Strength Normal and Lower Extremity Strength  Normal. Normal Exam - Right-Upper Extremity Strength Normal and Lower Extremity Strength Normal.  Lymphatic Head & Neck  General Head & Neck Lymphatics: Bilateral - Description - Normal. Axillary  General Axillary Region: Bilateral - Description - Normal. Tenderness - Non Tender. Femoral & Inguinal  Generalized Femoral & Inguinal Lymphatics: Bilateral - Description - Normal. Tenderness - Non Tender.    Assessment & Plan  CANCER OF CENTRAL PORTION OF RIGHT BREAST (174.1  C50.111) Current Plans  Follow up in 2 weeks or as needed You have been diagnosed with a locally advanced cancer of the right breast. You have stated that she would like to be considered for breast conservation surgery if possible. That may or may not be possible but we will try. You are scheduled for breast MRI on December 8 You will be referred to a medical oncologist before any surgery is done to get their opinion You may be offered chemotherapy prior to surgery to shrink the tumor. To perform chemotherapy you may need a Port-A-Cath to be inserted, as we discussed. Return to see Dr. Dalbert Batman in 2 weeks after all of this is done and we will finalize our plans. BREAST CANCER METASTASIZED TO AXILLARY LYMPH NODE, RIGHT (174.9  C50.911) HYPERTENSION, BENIGN (401.1  I10) Impression: On lisinopril DIABETES TYPE 2, CONTROLLED (250.00  E11.9) Impression: On Glucotrol BMI 37.0-37.9, ADULT (V85.37  Z68.37) S/P TAH-BSO (V88.01  Z90.710) Impression: 25 years ago. Abnormal Pap smear. No cancer found.   Edsel Petrin. Dalbert Batman, M.D., [facility] General and Minimally invasive Surgery Breast and Colorectal Surgery Office:   [phone] Pager:   [phone]

## 2014-06-18 NOTE — Progress Notes (Addendum)
New patient intake form received from patient.  Chart updated - sent to scan.  MD note created during office visit.  - copy to pt.  Original to scan.

## 2014-06-18 NOTE — Progress Notes (Signed)
Pt added on this am-will come in for [facility] labs and cxr-

## 2014-06-19 ENCOUNTER — Ambulatory Visit (HOSPITAL_COMMUNITY): Payer: Medicaid Other

## 2014-06-19 ENCOUNTER — Ambulatory Visit (HOSPITAL_BASED_OUTPATIENT_CLINIC_OR_DEPARTMENT_OTHER): Payer: Medicaid Other | Admitting: Certified Registered"

## 2014-06-19 ENCOUNTER — Ambulatory Visit (HOSPITAL_BASED_OUTPATIENT_CLINIC_OR_DEPARTMENT_OTHER)
Admission: RE | Admit: 2014-06-19 | Discharge: 2014-06-19 | Disposition: A | Discharge status: Home / Self Care | Payer: Medicaid Other | Source: Ambulatory Visit | Attending: General Surgery | Admitting: General Surgery

## 2014-06-19 ENCOUNTER — Encounter (HOSPITAL_BASED_OUTPATIENT_CLINIC_OR_DEPARTMENT_OTHER): Payer: Self-pay | Admitting: Certified Registered"

## 2014-06-19 ENCOUNTER — Encounter (HOSPITAL_BASED_OUTPATIENT_CLINIC_OR_DEPARTMENT_OTHER)
Admission: RE | Disposition: A | Discharge status: Home / Self Care | Payer: Self-pay | Source: Ambulatory Visit | Attending: General Surgery

## 2014-06-19 DIAGNOSIS — E119 Type 2 diabetes mellitus without complications: Secondary | ICD-10-CM | POA: Insufficient documentation

## 2014-06-19 DIAGNOSIS — C50411 Malignant neoplasm of upper-outer quadrant of right female breast: Secondary | ICD-10-CM | POA: Diagnosis present

## 2014-06-19 DIAGNOSIS — Z836 Family history of other diseases of the respiratory system: Secondary | ICD-10-CM | POA: Insufficient documentation

## 2014-06-19 DIAGNOSIS — Z6837 Body mass index (BMI) 37.0-37.9, adult: Secondary | ICD-10-CM | POA: Diagnosis not present

## 2014-06-19 DIAGNOSIS — Z95828 Presence of other vascular implants and grafts: Secondary | ICD-10-CM

## 2014-06-19 DIAGNOSIS — Z6841 Body Mass Index (BMI) 40.0 and over, adult: Secondary | ICD-10-CM | POA: Insufficient documentation

## 2014-06-19 DIAGNOSIS — C50919 Malignant neoplasm of unspecified site of unspecified female breast: Secondary | ICD-10-CM

## 2014-06-19 DIAGNOSIS — Z01818 Encounter for other preprocedural examination: Secondary | ICD-10-CM

## 2014-06-19 DIAGNOSIS — I10 Essential (primary) hypertension: Secondary | ICD-10-CM | POA: Insufficient documentation

## 2014-06-19 DIAGNOSIS — Z8249 Family history of ischemic heart disease and other diseases of the circulatory system: Secondary | ICD-10-CM | POA: Insufficient documentation

## 2014-06-19 HISTORY — PX: PORTACATH PLACEMENT: SHX2246

## 2014-06-19 HISTORY — DX: Malignant (primary) neoplasm, unspecified: C80.1

## 2014-06-19 LAB — GLUCOSE, CAPILLARY
GLUCOSE-CAPILLARY: 117 mg/dL — AB (ref 70–99)
Glucose-Capillary: 109 mg/dL — ABNORMAL HIGH (ref 70–99)

## 2014-06-19 SURGERY — INSERTION, TUNNELED CENTRAL VENOUS DEVICE, WITH PORT
Anesthesia: General | Site: Chest | Laterality: Left

## 2014-06-19 MED ORDER — FENTANYL CITRATE 0.05 MG/ML IJ SOLN: 25.0000 ug | INTRAMUSCULAR | Status: DC | PRN

## 2014-06-19 MED ORDER — SODIUM CHLORIDE 0.9 % IJ SOLN: 3.0000 mL | Freq: Two times a day (BID) | INTRAMUSCULAR | Status: DC

## 2014-06-19 MED ORDER — HEPARIN (PORCINE) IN NACL 2-0.9 UNIT/ML-% IJ SOLN
INTRAMUSCULAR | Status: DC | PRN
  Administered 2014-06-19: 1 via INTRAVENOUS

## 2014-06-19 MED ORDER — ACETAMINOPHEN 325 MG PO TABS: 650.0000 mg | ORAL_TABLET | ORAL | Status: DC | PRN

## 2014-06-19 MED ORDER — HYDROMORPHONE HCL 1 MG/ML IJ SOLN
0.2500 mg | INTRAMUSCULAR | Status: DC | PRN
  Administered 2014-06-19: 0.5 mg via INTRAVENOUS

## 2014-06-19 MED ORDER — FENTANYL CITRATE 0.05 MG/ML IJ SOLN
INTRAMUSCULAR | Status: DC | PRN
  Administered 2014-06-19 (×3): 50 ug via INTRAVENOUS

## 2014-06-19 MED ORDER — LIDOCAINE HCL (CARDIAC) 20 MG/ML IV SOLN
INTRAVENOUS | Status: DC | PRN
  Administered 2014-06-19: 80 mg via INTRAVENOUS

## 2014-06-19 MED ORDER — CEFAZOLIN SODIUM-DEXTROSE 2-3 GM-% IV SOLR
2.0000 g | INTRAVENOUS | Status: AC
  Administered 2014-06-19: 2 g via INTRAVENOUS

## 2014-06-19 MED ORDER — ONDANSETRON HCL 4 MG/2ML IJ SOLN
INTRAMUSCULAR | Status: DC | PRN
  Administered 2014-06-19: 4 mg via INTRAVENOUS

## 2014-06-19 MED ORDER — HYDROMORPHONE HCL 1 MG/ML IJ SOLN
INTRAMUSCULAR | Status: AC
  Filled 2014-06-19: qty 1

## 2014-06-19 MED ORDER — FENTANYL CITRATE 0.05 MG/ML IJ SOLN: 50.0000 ug | INTRAMUSCULAR | Status: DC | PRN

## 2014-06-19 MED ORDER — BUPIVACAINE-EPINEPHRINE (PF) 0.5% -1:200000 IJ SOLN
INTRAMUSCULAR | Status: DC | PRN
  Administered 2014-06-19: 10 mL via PERINEURAL

## 2014-06-19 MED ORDER — MIDAZOLAM HCL 5 MG/5ML IJ SOLN
INTRAMUSCULAR | Status: DC | PRN
  Administered 2014-06-19: 2 mg via INTRAVENOUS

## 2014-06-19 MED ORDER — HEPARIN SOD (PORK) LOCK FLUSH 100 UNIT/ML IV SOLN
INTRAVENOUS | Status: DC | PRN
  Administered 2014-06-19: 400 [IU] via INTRAVENOUS

## 2014-06-19 MED ORDER — LACTATED RINGERS IV SOLN
INTRAVENOUS | Status: DC
  Administered 2014-06-19 (×2): via INTRAVENOUS

## 2014-06-19 MED ORDER — HEPARIN (PORCINE) IN NACL 2-0.9 UNIT/ML-% IJ SOLN
INTRAMUSCULAR | Status: AC
  Filled 2014-06-19: qty 500

## 2014-06-19 MED ORDER — SODIUM CHLORIDE 0.9 % IV SOLN: INTRAVENOUS | Status: DC

## 2014-06-19 MED ORDER — ACETAMINOPHEN 650 MG RE SUPP: 650.0000 mg | RECTAL | Status: DC | PRN

## 2014-06-19 MED ORDER — SODIUM BICARBONATE 4 % IV SOLN
INTRAVENOUS | Status: AC
  Filled 2014-06-19: qty 5

## 2014-06-19 MED ORDER — LIDOCAINE-EPINEPHRINE 1 %-1:100000 IJ SOLN
INTRAMUSCULAR | Status: AC
  Filled 2014-06-19: qty 1

## 2014-06-19 MED ORDER — SODIUM CHLORIDE 0.9 % IJ SOLN
3.0000 mL | INTRAMUSCULAR | Status: DC | PRN
Start: 2014-06-19 — End: 2014-06-19

## 2014-06-19 MED ORDER — MIDAZOLAM HCL 2 MG/2ML IJ SOLN: 1.0000 mg | INTRAMUSCULAR | Status: DC | PRN

## 2014-06-19 MED ORDER — DEXAMETHASONE SODIUM PHOSPHATE 4 MG/ML IJ SOLN
INTRAMUSCULAR | Status: DC | PRN
  Administered 2014-06-19: 4 mg via INTRAVENOUS

## 2014-06-19 MED ORDER — PROPOFOL 10 MG/ML IV BOLUS
INTRAVENOUS | Status: DC | PRN
  Administered 2014-06-19: 15 mg via INTRAVENOUS

## 2014-06-19 MED ORDER — BUPIVACAINE-EPINEPHRINE (PF) 0.5% -1:200000 IJ SOLN
INTRAMUSCULAR | Status: AC
  Filled 2014-06-19: qty 30

## 2014-06-19 MED ORDER — OXYCODONE HCL 5 MG PO TABS: 5.0000 mg | ORAL_TABLET | ORAL | Status: DC | PRN

## 2014-06-19 MED ORDER — HYDROCODONE-ACETAMINOPHEN 5-325 MG PO TABS: 1.0000 | ORAL_TABLET | Freq: Four times a day (QID) | ORAL | Status: DC | PRN

## 2014-06-19 MED ORDER — SODIUM CHLORIDE 0.9 % IV SOLN
250.0000 mL | INTRAVENOUS | Status: DC | PRN
Start: 2014-06-19 — End: 2014-06-19

## 2014-06-19 MED ORDER — HEPARIN SOD (PORK) LOCK FLUSH 100 UNIT/ML IV SOLN
INTRAVENOUS | Status: AC
  Filled 2014-06-19: qty 5

## 2014-06-19 MED ORDER — CHLORHEXIDINE GLUCONATE 4 % EX LIQD: 1.0000 "application " | Freq: Once | CUTANEOUS | Status: DC

## 2014-06-19 MED ORDER — PROMETHAZINE HCL 25 MG/ML IJ SOLN: 6.2500 mg | INTRAMUSCULAR | Status: DC | PRN

## 2014-06-19 SURGICAL SUPPLY — 60 items
APL SKNCLS STERI-STRIP NONHPOA (GAUZE/BANDAGES/DRESSINGS)
BAG DECANTER FOR FLEXI CONT (MISCELLANEOUS) ×3 IMPLANT
BENZOIN TINCTURE PRP APPL 2/3 (GAUZE/BANDAGES/DRESSINGS) IMPLANT
BLADE HEX COATED 2.75 (ELECTRODE) ×3 IMPLANT
BLADE SURG 15 STRL LF DISP TIS (BLADE) ×1 IMPLANT
BLADE SURG 15 STRL SS (BLADE) ×3
CANISTER SUCT 1200ML W/VALVE (MISCELLANEOUS) IMPLANT
CHLORAPREP W/TINT 26ML (MISCELLANEOUS) ×3 IMPLANT
CLOSURE WOUND 1/2 X4 (GAUZE/BANDAGES/DRESSINGS)
COVER BACK TABLE 60X90IN (DRAPES) ×3 IMPLANT
COVER MAYO STAND STRL (DRAPES) ×3 IMPLANT
COVER PROBE 5X48 (MISCELLANEOUS) ×3
DECANTER SPIKE VIAL GLASS SM (MISCELLANEOUS) IMPLANT
DRAPE C-ARM 42X72 X-RAY (DRAPES) ×3 IMPLANT
DRAPE LAPAROSCOPIC ABDOMINAL (DRAPES) ×3 IMPLANT
DRAPE UTILITY XL STRL (DRAPES) ×3 IMPLANT
DRSG TEGADERM 2-3/8X2-3/4 SM (GAUZE/BANDAGES/DRESSINGS) IMPLANT
DRSG TEGADERM 4X10 (GAUZE/BANDAGES/DRESSINGS) IMPLANT
DRSG TEGADERM 4X4.75 (GAUZE/BANDAGES/DRESSINGS) IMPLANT
ELECT REM PT RETURN 9FT ADLT (ELECTROSURGICAL) ×3
ELECTRODE REM PT RTRN 9FT ADLT (ELECTROSURGICAL) ×1 IMPLANT
GLOVE BIOGEL PI IND STRL 6.5 (GLOVE) IMPLANT
GLOVE BIOGEL PI IND STRL 7.0 (GLOVE) IMPLANT
GLOVE BIOGEL PI INDICATOR 6.5 (GLOVE) ×2
GLOVE BIOGEL PI INDICATOR 7.0 (GLOVE) ×2
GLOVE ECLIPSE 6.5 STRL STRAW (GLOVE) ×2 IMPLANT
GLOVE EUDERMIC 7 POWDERFREE (GLOVE) ×3 IMPLANT
GLOVE EXAM NITRILE MD LF STRL (GLOVE) ×2 IMPLANT
GOWN STRL REUS W/ TWL LRG LVL3 (GOWN DISPOSABLE) ×1 IMPLANT
GOWN STRL REUS W/ TWL XL LVL3 (GOWN DISPOSABLE) ×1 IMPLANT
GOWN STRL REUS W/TWL LRG LVL3 (GOWN DISPOSABLE) ×3
GOWN STRL REUS W/TWL XL LVL3 (GOWN DISPOSABLE) ×3
IV CATH PLACEMENT UNIT 16 GA (IV SOLUTION) IMPLANT
IV KIT MINILOC 20X1 SAFETY (NEEDLE) IMPLANT
KIT BARDPORT ISP (Port) IMPLANT
KIT CVR 48X5XPRB PLUP LF (MISCELLANEOUS) ×1 IMPLANT
KIT PORT POWER 8FR ISP CVUE (Catheter) ×3 IMPLANT
LIQUID BAND (GAUZE/BANDAGES/DRESSINGS) ×3 IMPLANT
NDL BLUNT 17GA (NEEDLE) IMPLANT
NDL HYPO 25X1 1.5 SAFETY (NEEDLE) ×1 IMPLANT
NEEDLE BLUNT 17GA (NEEDLE) IMPLANT
NEEDLE HYPO 22GX1.5 SAFETY (NEEDLE) ×3 IMPLANT
NEEDLE HYPO 25X1 1.5 SAFETY (NEEDLE) ×3 IMPLANT
PACK BASIN DAY SURGERY FS (CUSTOM PROCEDURE TRAY) ×3 IMPLANT
PENCIL BUTTON HOLSTER BLD 10FT (ELECTRODE) ×3 IMPLANT
SET SHEATH INTRODUCER 10FR (MISCELLANEOUS) IMPLANT
SHEATH COOK PEEL AWAY SET 9F (SHEATH) IMPLANT
SLEEVE SCD COMPRESS KNEE MED (MISCELLANEOUS) ×3 IMPLANT
SPONGE GAUZE 4X4 12PLY STER LF (GAUZE/BANDAGES/DRESSINGS) IMPLANT
STRIP CLOSURE SKIN 1/2X4 (GAUZE/BANDAGES/DRESSINGS) IMPLANT
SUT MNCRL AB 4-0 PS2 18 (SUTURE) ×3 IMPLANT
SUT PROLENE 2 0 CT2 30 (SUTURE) ×3 IMPLANT
SUT VICRYL 3-0 CR8 SH (SUTURE) ×3 IMPLANT
SYR 5ML LUER SLIP (SYRINGE) ×3 IMPLANT
SYRINGE 10CC LL (SYRINGE) ×3 IMPLANT
TOWEL OR 17X24 6PK STRL BLUE (TOWEL DISPOSABLE) ×6 IMPLANT
TOWEL OR NON WOVEN STRL DISP B (DISPOSABLE) ×1 IMPLANT
TUBE CONNECTING 20'X1/4 (TUBING)
TUBE CONNECTING 20X1/4 (TUBING) IMPLANT
YANKAUER SUCT BULB TIP NO VENT (SUCTIONS) IMPLANT

## 2014-06-19 NOTE — Discharge Instructions (Signed)
° ° °PORT-A-CATH: POST OP INSTRUCTIONS ° °Always review your discharge instruction sheet given to you by the facility where your surgery was performed.  ° °1. A prescription for pain medication may be given to you upon discharge. Take your pain medication as prescribed, if needed. If narcotic pain medicine is not needed, then you make take acetaminophen (Tylenol) or ibuprofen (Advil) as needed.  °2. Take your usually prescribed medications unless otherwise directed. °3. If you need a refill on your pain medication, please contact our office. All narcotic pain medicine now requires a paper prescription.  Phoned in and fax refills are no longer allowed by law.  Prescriptions will not be filled after 5 pm or on weekends.  °4. You should follow a light diet for the remainder of the day after your procedure. °5. Most patients will experience some mild swelling and/or bruising in the area of the incision. It may take several days to resolve. °6. It is common to experience some constipation if taking pain medication after surgery. Increasing fluid intake and taking a stool softener (such as Colace) will usually help or prevent this problem from occurring. A mild laxative (Milk of Magnesia or Miralax) should be taken according to package directions if there are no bowel movements after 48 hours.  °7. Unless discharge instructions indicate otherwise, you may remove your bandages 48 hours after surgery, and you may shower at that time. You may have steri-strips (small white skin tapes) in place directly over the incision.  These strips should be left on the skin for 7-10 days.  If your surgeon used Dermabond (skin glue) on the incision, you may shower in 24 hours.  The glue will flake off over the next 2-3 weeks.  °8. If your port is left accessed at the end of surgery (needle left in port), the dressing cannot get wet and should only by changed by a healthcare professional. When the port is no longer accessed (when the  needle has been removed), follow step 7.   °9. ACTIVITIES:  Limit activity involving your arms for the next 72 hours. Do no strenuous exercise or activity for 1 week. You may drive when you are no longer taking prescription pain medication, you can comfortably wear a seatbelt, and you can maneuver your car. °10.You may need to see your doctor in the office for a follow-up appointment.  Please °      check with your doctor.  °11.When you receive a new Port-a-Cath, you will get a product guide and  °      ID card.  Please keep them in case you need them. ° °WHEN TO CALL YOUR DOCTOR ([phone]): °1. Fever over 101.0 °2. Chills °3. Continued bleeding from incision °4. Increased redness and tenderness at the site °5. Shortness of breath, difficulty breathing ° ° °The clinic staff is available to answer your questions during regular business hours. Please don’t hesitate to call and ask to speak to one of the nurses or medical assistants for clinical concerns. If you have a medical emergency, go to the nearest emergency room or call 911.  A surgeon from [facility] is always on call at the hospital.  ° ° ° °For further information, please visit [email or web address] ° ° °Post Anesthesia Home Care Instructions ° °Activity: °Get plenty of rest for the remainder of the day. A responsible adult should stay with you for 24 hours following the procedure.  °For the next 24 hours, DO NOT: °-Drive a car °-  Operate machinery °-Drink alcoholic beverages °-Take any medication unless instructed by your physician °-Make any legal decisions or sign important papers. ° °Meals: °Start with liquid foods such as gelatin or soup. Progress to regular foods as tolerated. Avoid greasy, spicy, heavy foods. If nausea and/or vomiting occur, drink only clear liquids until the nausea and/or vomiting subsides. Call your physician if vomiting continues. ° °Special Instructions/Symptoms: °Your throat may feel dry or sore from the  anesthesia or the breathing tube placed in your throat during surgery. If this causes discomfort, gargle with warm salt water. The discomfort should disappear within 24 hours. ° ° ° ° ° ° °

## 2014-06-19 NOTE — Anesthesia Preprocedure Evaluation (Addendum)
Anesthesia Evaluation  Patient identified by MRN, date of birth, ID band Patient awake    Reviewed: Allergy & Precautions, H&P , NPO status , Patient's Chart, lab work & pertinent test results  History of Anesthesia Complications Negative for: history of anesthetic complications  Airway Mallampati: II  TM Distance: >3 FB Neck ROM: Full    Dental  (+) Teeth Intact   Pulmonary neg pulmonary ROS,  breath sounds clear to auscultation        Cardiovascular hypertension, Rhythm:Regular Rate:Normal     Neuro/Psych negative neurological ROS     GI/Hepatic negative GI ROS, Neg liver ROS,   Endo/Other  diabetesMorbid obesity  Renal/GU      Musculoskeletal   Abdominal   Peds  Hematology   Anesthesia Other Findings   Reproductive/Obstetrics                            Anesthesia Physical Anesthesia Plan  ASA: III  Anesthesia Plan: General   Post-op Pain Management:    Induction: Intravenous  Airway Management Planned: LMA  Additional Equipment:   Intra-op Plan:   Post-operative Plan: Extubation in OR  Informed Consent: I have reviewed the patients History and Physical, chart, labs and discussed the procedure including the risks, benefits and alternatives for the proposed anesthesia with the patient or authorized representative who has indicated his/her understanding and acceptance.     Plan Discussed with: CRNA and Surgeon  Anesthesia Plan Comments:         Anesthesia Quick Evaluation

## 2014-06-19 NOTE — Anesthesia Postprocedure Evaluation (Signed)
  Anesthesia Post-op Note  Patient: Marissa Haney  Procedure(s) Performed: Procedure(s): INSERTION PORT-A-CATH (Left)  Patient Location: PACU  Anesthesia Type:General  Level of Consciousness: awake and alert   Airway and Oxygen Therapy: Patient Spontanous Breathing  Post-op Pain: none  Post-op Assessment: Post-op Vital signs reviewed  Post-op Vital Signs: Reviewed  Last Vitals:  Filed Vitals:   06/19/14 1600  BP: 164/62  Pulse: 50  Temp:   Resp: 16    Complications: No apparent anesthesia complications

## 2014-06-19 NOTE — Op Note (Signed)
Patient Name:           Marissa Haney   Date of Surgery:        06/19/2014  Pre op Diagnosis:      Locally advanced cancer, right breast  Post op Diagnosis:    Same  Procedure:                 Insertion of 8 French power port clear view tunneled venous vascular access device Use of fluoroscopy for guidance and positioning  Surgeon:                     Edsel Petrin. Dalbert Batman, M.D., FACS  Assistant:                      OR staff  Operative Indications:    This is a pleasant 58 year old Afro-American female from [place]. She was referred by Dr. Lovey Newcomer at the [facility] [address] for initial surgical evaluation of a locally advanced cancer of the right breast. Cyndi Bender, PA, in [place] provides her primary care. She has not had any breast disease in the past. She recently woke up and felt a lump and a little bit of discomfort in the upper central right breast. She saw her PCP and was referred for mammography at [facility]. This revealed a 5 cm mass in the 12 o'clock position of the right breast which was also palpable with slight skin thickening on mammogram. Multiple right axillary masses were noted and were suspicious for metastatic disease to the right axillary lymph nodes. There was no evidence of malignancy in the left breast. She was referred to the breast center of [facility] and recently underwent biopsy of the primary mass in the right breast and one of the right axillary lymph nodes by Lovey Newcomer. . Both of these biopsies showed invasive ductal carcinoma with lymphovascular invasion. Receptor positive. HER-2 negative.  She is definitely interested in breast conservation, if this is feasible.She had TAH and BSO 25 years ago for abnormal Pap smear but there was no cancer. BMI is 37. She has hypertension and type 2 diabetes. Family history is negative for breast or ovarian cancer. We had a long talk about her breast cancer specifically and treatment of breast cancer in general.  . I told her it might be possible to perform breast conservation surgery following neoadjuvant chemotherapy but we would have to wait and see what the MRI shows. She has seen Dr. Ladona Mow who plans neoadjuvant chemotherapy. She knows that if she has multifocal disease she would ultimately need a mastectomy.  I told her she may need radiation therapy regardless of the extent of surgery. I told her that if she was going to get chemotherapy that we would place a Port-A-Cath and we discussed that for some time as well. Marland Kitchen  Operative Findings:       I was fortunately able to insert the Port-A-Cath through the left subclavian vein with a single pass. The tip of catheter appears to be in the superior vena cava near the right atrial junction. The catheter flushes well and has excellent blood return. It appears well-positioned by C-arm.  Procedure in Detail:          Following the induction of general LMA anesthesia the patient was positioned with a roll behind her shoulders and her arms at her sides. The neck and chest were prepped and draped in a sterile fashion. Intravenous antibiotics were given. Surgical  timeout was performed. 0.5% Marcaine with epinephrine was used as a local infiltration anesthetic.      A left subclavian venipuncture was performed. I got blood return on the first pass and threaded a wire into this. Vena cava under fluoroscopic guidance. Using fluoroscopy as a guide a marked a template on the chest wall for measuring the length of the catheter in positioning in this. Vena cava. Small incision was made at the wire insertion site. A 3 cm transverse incision was made a little more medially in the infraclavicular area. A subcutaneous pocket was created at the level of the pectoralis fascia. Using a tunneling device I passed the catheter from the port pocket site to the wire insertion site. Using the template marking the chest wall I measured the catheter and cut it 25 cm in length. The catheter was  then secured to the port with the locking device and flushed with heparinized saline. I passed the dilator and peel-away sheath over the guidewire into the central venous circulation. I removed the wire and the dilator and threaded the catheter easily through the peel-away sheath. I then removed the peel-away sheath. The catheter flushed easily and had excellent blood return. Fluoroscopy confirmed that the tip of the catheter was in the superior vena cava near the right atrial junction. There is no deformity or kinking of the catheter. The port and catheter were then flushed with concentrated heparin solution. The port was sutured to the pectoralis fascia with 3 interrupted sutures of 2-0 Prolene. The subcutaneous tissue was closed with 3-0 Vicryl and skin closed with subcuticular 4-0 Monocryl and Dermabond. The patient tolerated the procedure well was taken to PACU in stable condition. EBL 10 mL. Counts correct. Competitions none.     Edsel Petrin. Dalbert Batman, M.D., FACS General and Minimally Invasive Surgery Breast and Colorectal Surgery  06/19/2014 2:58 PM

## 2014-06-19 NOTE — Anesthesia Procedure Notes (Signed)
Procedure Name: LMA Insertion Date/Time: 06/19/2014 2:11 PM Performed by: Maryella Shivers Pre-anesthesia Checklist: Patient identified, Emergency Drugs available, Suction available and Patient being monitored Patient Re-evaluated:Patient Re-evaluated prior to inductionOxygen Delivery Method: Circle System Utilized Preoxygenation: Pre-oxygenation with 100% oxygen Intubation Type: IV induction Ventilation: Mask ventilation without difficulty LMA: LMA inserted LMA Size: 4.0 Number of attempts: 1 Airway Equipment and Method: bite block Placement Confirmation: positive ETCO2 Tube secured with: Tape Dental Injury: Teeth and Oropharynx as per pre-operative assessment

## 2014-06-19 NOTE — Transfer of Care (Signed)
Immediate Anesthesia Transfer of Care Note  Patient: Marissa Haney  Procedure(s) Performed: Procedure(s): INSERTION PORT-A-CATH (Left)  Patient Location: PACU  Anesthesia Type:General  Level of Consciousness: awake, alert  and oriented  Airway & Oxygen Therapy: Patient Spontanous Breathing and Patient connected to face mask oxygen  Post-op Assessment: Report given to PACU RN and Post -op Vital signs reviewed and stable  Post vital signs: Reviewed and stable  Complications: No apparent anesthesia complications

## 2014-06-19 NOTE — Interval H&P Note (Signed)
History and Physical Interval Note:  06/19/2014 1:51 PM  Marissa Haney  has presented today for surgery, with the diagnosis of breast cancer  The goals and the various methods of treatment have been discussed with the patient and family.  Risks were discussed in detail.  After consideration of risks, benefits and other options for treatment, the patient has consented to  Procedure(s): INSERTION PORT-A-CATH WITH ULTRASOUND (N/A) as a surgical intervention .  The patient's history has been reviewed, patient examined today, no change in status, stable for surgery.  I have reviewed the patient's chart and labs.  Questions were answered to the patient's satisfaction.     Adin Hector

## 2014-06-23 ENCOUNTER — Encounter (HOSPITAL_BASED_OUTPATIENT_CLINIC_OR_DEPARTMENT_OTHER): Payer: Self-pay | Admitting: General Surgery

## 2014-06-25 ENCOUNTER — Encounter: Payer: Self-pay | Admitting: *Deleted

## 2014-06-25 ENCOUNTER — Other Ambulatory Visit: Payer: No Typology Code available for payment source

## 2014-06-25 ENCOUNTER — Other Ambulatory Visit: Payer: Self-pay | Admitting: Hematology and Oncology

## 2014-06-25 ENCOUNTER — Encounter: Payer: No Typology Code available for payment source | Admitting: *Deleted

## 2014-06-25 ENCOUNTER — Ambulatory Visit (HOSPITAL_COMMUNITY)
Admission: RE | Admit: 2014-06-25 | Discharge: 2014-06-25 | Disposition: A | Discharge status: Home / Self Care | Payer: Medicaid Other | Source: Ambulatory Visit | Attending: Hematology and Oncology | Admitting: Hematology and Oncology

## 2014-06-25 DIAGNOSIS — E119 Type 2 diabetes mellitus without complications: Secondary | ICD-10-CM | POA: Insufficient documentation

## 2014-06-25 DIAGNOSIS — Z01818 Encounter for other preprocedural examination: Secondary | ICD-10-CM | POA: Diagnosis present

## 2014-06-25 DIAGNOSIS — C50411 Malignant neoplasm of upper-outer quadrant of right female breast: Secondary | ICD-10-CM | POA: Insufficient documentation

## 2014-06-25 DIAGNOSIS — I059 Rheumatic mitral valve disease, unspecified: Secondary | ICD-10-CM | POA: Diagnosis not present

## 2014-06-25 DIAGNOSIS — E785 Hyperlipidemia, unspecified: Secondary | ICD-10-CM | POA: Diagnosis not present

## 2014-06-25 NOTE — Progress Notes (Signed)
  Echocardiogram 2D Echocardiogram has been performed.  Diamond Nickel 06/25/2014, 9:52 AM

## 2014-06-30 ENCOUNTER — Telehealth: Payer: Self-pay

## 2014-06-30 NOTE — Telephone Encounter (Signed)
LMOVM - Returning pt call.  Pt to call clinic.

## 2014-07-01 ENCOUNTER — Other Ambulatory Visit: Payer: Self-pay | Admitting: Hematology and Oncology

## 2014-07-01 ENCOUNTER — Telehealth: Payer: Self-pay

## 2014-07-01 NOTE — Telephone Encounter (Signed)
Pt left msg 12/28 that she had fallen and fractured her wrist.   12/29 - per Dr Lindi Adie, if fracture has been treated/pt in cast she can be treated 1st chemo next wk.   Returned pt call - pt reports fractured 12/24.  Pt did not go to MD until 12/28 - arm was splinted and pt told to follow up with orthopedics.  Pt reported she has not gone to orthopedic - she was "waiting for [place] to let her know who to go to".  Let pt know I would call her back with orthopedic referral.  [facility]: they can get pt in but pt will need to pay $250 up front since she does not have insurance.    Called pt - does not have $250.  Has no insurance.  Reports medicaid application was signed and sent but she has not heard back yet.   Contacted Abigail/SW - Will ask [place] fund but does not believe they will cover the cost, recommended we contact outreach.  Contacted Dottie/Outreach - no info available and others associated with [facility] on PAL until Thursday.    LMOVM for patient with numbers for [place], and [place] with instructions to contact them about appt without requiring up front payment.  Pt to call clinic to advise appt d/t.

## 2014-07-03 ENCOUNTER — Telehealth: Payer: Self-pay

## 2014-07-03 NOTE — Telephone Encounter (Signed)
Returned pt call re: wrist not taken care of yet, do we have info on her medicaid.  Confirmed pt has not seen an orthopedic yet.  Pt denies receiving the msg this RN left on her VM 12/29 with contact information for orthopedic MDs.  Gave the patient this info again - names and phone numbers.  Advised pt she would have to call them to find one that would treat her without an upfront pymt.  Let pt know the person handling her medicaid is out of the office until next week and that she can contact her local DSS office to inquire about status.  Pt voiced understanding.

## 2014-07-08 ENCOUNTER — Other Ambulatory Visit: Payer: Self-pay | Admitting: *Deleted

## 2014-07-08 ENCOUNTER — Encounter: Payer: Self-pay | Admitting: *Deleted

## 2014-07-08 ENCOUNTER — Telehealth: Payer: Self-pay | Admitting: *Deleted

## 2014-07-08 DIAGNOSIS — C50411 Malignant neoplasm of upper-outer quadrant of right female breast: Secondary | ICD-10-CM

## 2014-07-08 MED ORDER — DEXAMETHASONE 4 MG PO TABS: ORAL_TABLET | ORAL | Status: DC

## 2014-07-08 MED ORDER — LIDOCAINE-PRILOCAINE 2.5-2.5 % EX CREA: TOPICAL_CREAM | CUTANEOUS | Status: DC

## 2014-07-08 MED ORDER — ONDANSETRON HCL 8 MG PO TABS: 8.0000 mg | ORAL_TABLET | Freq: Two times a day (BID) | ORAL | Status: DC | PRN

## 2014-07-08 MED ORDER — PROCHLORPERAZINE MALEATE 10 MG PO TABS: 10.0000 mg | ORAL_TABLET | Freq: Four times a day (QID) | ORAL | Status: DC | PRN

## 2014-07-08 MED ORDER — LORAZEPAM 0.5 MG PO TABS: 0.5000 mg | ORAL_TABLET | Freq: Four times a day (QID) | ORAL | Status: DC | PRN

## 2014-07-08 NOTE — Telephone Encounter (Signed)
Attempted to call patient yesterday and today to ask if wrist has been casted. Phone call does not go through stating incorrect code.

## 2014-07-10 ENCOUNTER — Other Ambulatory Visit (HOSPITAL_BASED_OUTPATIENT_CLINIC_OR_DEPARTMENT_OTHER): Payer: Medicaid Other

## 2014-07-10 ENCOUNTER — Ambulatory Visit (HOSPITAL_BASED_OUTPATIENT_CLINIC_OR_DEPARTMENT_OTHER): Payer: Medicaid Other | Admitting: Hematology and Oncology

## 2014-07-10 ENCOUNTER — Ambulatory Visit (HOSPITAL_BASED_OUTPATIENT_CLINIC_OR_DEPARTMENT_OTHER): Payer: Medicaid Other

## 2014-07-10 ENCOUNTER — Encounter: Payer: Self-pay | Admitting: *Deleted

## 2014-07-10 DIAGNOSIS — C773 Secondary and unspecified malignant neoplasm of axilla and upper limb lymph nodes: Secondary | ICD-10-CM

## 2014-07-10 DIAGNOSIS — Z170 Estrogen receptor positive status [ER+]: Secondary | ICD-10-CM

## 2014-07-10 DIAGNOSIS — Z5111 Encounter for antineoplastic chemotherapy: Secondary | ICD-10-CM

## 2014-07-10 DIAGNOSIS — C50411 Malignant neoplasm of upper-outer quadrant of right female breast: Secondary | ICD-10-CM

## 2014-07-10 LAB — CBC WITH DIFFERENTIAL/PLATELET
BASO%: 0.2 % (ref 0.0–2.0)
BASOS ABS: 0 10*3/uL (ref 0.0–0.1)
EOS%: 2.8 % (ref 0.0–7.0)
Eosinophils Absolute: 0.3 10*3/uL (ref 0.0–0.5)
HCT: 42.6 % (ref 34.8–46.6)
HEMOGLOBIN: 13.8 g/dL (ref 11.6–15.9)
LYMPH#: 3.9 10*3/uL — AB (ref 0.9–3.3)
LYMPH%: 35.7 % (ref 14.0–49.7)
MCH: 30.6 pg (ref 25.1–34.0)
MCHC: 32.4 g/dL (ref 31.5–36.0)
MCV: 94.5 fL (ref 79.5–101.0)
MONO#: 0.5 10*3/uL (ref 0.1–0.9)
MONO%: 4.6 % (ref 0.0–14.0)
NEUT%: 56.7 % (ref 38.4–76.8)
NEUTROS ABS: 6.2 10*3/uL (ref 1.5–6.5)
Platelets: 234 10*3/uL (ref 145–400)
RBC: 4.51 10*6/uL (ref 3.70–5.45)
RDW: 12.9 % (ref 11.2–14.5)
WBC: 10.9 10*3/uL — AB (ref 3.9–10.3)

## 2014-07-10 LAB — COMPREHENSIVE METABOLIC PANEL (CC13)
ALK PHOS: 148 U/L (ref 40–150)
ALT: 15 U/L (ref 0–55)
AST: 17 U/L (ref 5–34)
Albumin: 3.7 g/dL (ref 3.5–5.0)
Anion Gap: 9 mEq/L (ref 3–11)
BUN: 14.9 mg/dL (ref 7.0–26.0)
CALCIUM: 9 mg/dL (ref 8.4–10.4)
CO2: 30 mEq/L — ABNORMAL HIGH (ref 22–29)
Chloride: 102 mEq/L (ref 98–109)
Creatinine: 0.9 mg/dL (ref 0.6–1.1)
EGFR: 85 mL/min/{1.73_m2} — AB (ref >90)
GLUCOSE: 184 mg/dL — AB (ref 70–140)
Potassium: 3.7 mEq/L (ref 3.5–5.1)
Sodium: 141 mEq/L (ref 136–145)
Total Bilirubin: 0.74 mg/dL (ref 0.20–1.20)
Total Protein: 8 g/dL (ref 6.4–8.3)

## 2014-07-10 MED ORDER — SODIUM CHLORIDE 0.9 % IV SOLN
150.0000 mg | Freq: Once | INTRAVENOUS | Status: AC
  Administered 2014-07-10: 150 mg via INTRAVENOUS
  Filled 2014-07-10: qty 5

## 2014-07-10 MED ORDER — DEXAMETHASONE SODIUM PHOSPHATE 10 MG/ML IJ SOLN
INTRAMUSCULAR | Status: AC
  Filled 2014-07-10: qty 1

## 2014-07-10 MED ORDER — DEXAMETHASONE SODIUM PHOSPHATE 20 MG/5ML IJ SOLN
12.0000 mg | Freq: Once | INTRAMUSCULAR | Status: AC
  Administered 2014-07-10: 12 mg via INTRAVENOUS

## 2014-07-10 MED ORDER — SODIUM CHLORIDE 0.9 % IV SOLN
Freq: Once | INTRAVENOUS | Status: AC
  Administered 2014-07-10: 13:00:00 via INTRAVENOUS

## 2014-07-10 MED ORDER — SODIUM CHLORIDE 0.9 % IV SOLN
600.0000 mg/m2 | Freq: Once | INTRAVENOUS | Status: AC
  Administered 2014-07-10: 1360 mg via INTRAVENOUS
  Filled 2014-07-10: qty 68

## 2014-07-10 MED ORDER — SODIUM CHLORIDE 0.9 % IJ SOLN
10.0000 mL | INTRAMUSCULAR | Status: DC | PRN
  Administered 2014-07-10: 10 mL
  Filled 2014-07-10: qty 10

## 2014-07-10 MED ORDER — DOXORUBICIN HCL CHEMO IV INJECTION 2 MG/ML
60.0000 mg/m2 | Freq: Once | INTRAVENOUS | Status: AC
  Administered 2014-07-10: 136 mg via INTRAVENOUS
  Filled 2014-07-10: qty 68

## 2014-07-10 MED ORDER — METOPROLOL TARTRATE 50 MG PO TABS: 50.0000 mg | ORAL_TABLET | Freq: Two times a day (BID) | ORAL | Status: DC

## 2014-07-10 MED ORDER — DEXAMETHASONE SODIUM PHOSPHATE 20 MG/5ML IJ SOLN
INTRAMUSCULAR | Status: AC
  Filled 2014-07-10: qty 5

## 2014-07-10 MED ORDER — HEPARIN SOD (PORK) LOCK FLUSH 100 UNIT/ML IV SOLN
500.0000 [IU] | Freq: Once | INTRAVENOUS | Status: AC | PRN
  Administered 2014-07-10: 500 [IU]
  Filled 2014-07-10: qty 5

## 2014-07-10 MED ORDER — PALONOSETRON HCL INJECTION 0.25 MG/5ML
0.2500 mg | Freq: Once | INTRAVENOUS | Status: AC
  Administered 2014-07-10: 0.25 mg via INTRAVENOUS

## 2014-07-10 MED ORDER — PALONOSETRON HCL INJECTION 0.25 MG/5ML
INTRAVENOUS | Status: AC
  Filled 2014-07-10: qty 5

## 2014-07-10 NOTE — Progress Notes (Signed)
[place] Work  Clinical Social Work was referred by nurse for assessment of psychosocial needs due to financial concerns.  Clinical Social Worker met with patient at [facility] during her first infusion to offer support and assess for needs.  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 She currently receives $194 for food stamps and her medicaid is pending. CSW discussed resources through [facility] to assist and pt has connected with Raquel in Financial Counseling and was approved for the grant. CSW reviewed Cancer Care and other resources to assist with transportation, Pt and Family Support Team, etc. Pt plans to contact [place] and is aware of how to complete with CSW assistance when she receives the application. Pt reports to have good support from family and friends, finances are her biggest concern currently. CSW to follow and assist again next week.   Clinical Social Work interventions: Resource education Emotional support  Loren Racer, [place] Worker [place]  [place] Phone: [phone] Fax: [phone]

## 2014-07-10 NOTE — Assessment & Plan Note (Signed)
Right breast invasive ductal carcinoma 3.6 cm by MRI with enlarged right axillary lymph node biopsy proven to be positive for cancer , T2 N1 M0 stage IIB ER positive PR positive HER-2 negative Ki-67 22%  Current treatment:Dose dense Adriamycin and Cytoxan every 2 weeks 4 followed by Taxol weekly 12; today cycle 1 day 1  Chemotherapy monitoring: 1. Consent has been signed 2. I reviewed the antiemetic regimen with the patient 3. Chemotherapy education as been completed 4. Blood counts are adequate for treatment Return to clinic in 1 week for follow-up and toxicity check

## 2014-07-10 NOTE — Patient Instructions (Addendum)
[facility] Discharge Instructions for Patients Receiving Chemotherapy  Today you received the following chemotherapy agents Adriamycin, Cytoxan. You also received medications to prevent nausea including, AloxiI, Emend, and a steroid called decadron. The Emend should prevent  nausea for the next 2 days. On day 3 start your nausea pill , Ondansetron every 8 hours as needed and compazine every 6 hours if needed. The ondansetron and compazine work differently in your body so you can alternate them.To help prevent nausea and vomiting after your treatment, we encourage you to take your nausea medication as prescribed. If you have any questions please call and ask for Dr Geralyn Flash nurse.     If you develop nausea and vomiting that is not controlled by your nausea medication, call the clinic.   BELOW ARE SYMPTOMS THAT SHOULD BE REPORTED IMMEDIATELY:  *FEVER GREATER THAN 100.5 F  *CHILLS WITH OR WITHOUT FEVER  NAUSEA AND VOMITING THAT IS NOT CONTROLLED WITH YOUR NAUSEA MEDICATION  *UNUSUAL SHORTNESS OF BREATH  *UNUSUAL BRUISING OR BLEEDING  TENDERNESS IN MOUTH AND THROAT WITH OR WITHOUT PRESENCE OF ULCERS  *URINARY PROBLEMS  *BOWEL PROBLEMS  UNUSUAL RASH Items with * indicate a potential emergency and should be followed up as soon as possible.  Feel free to call the clinic you have any questions or concerns. The clinic phone number is [phone].   Pegfilgrastim injection What is this medicine? PEGFILGRASTIM (peg fil GRA stim) is a long-acting granulocyte colony-stimulating factor that stimulates the growth of neutrophils, a type of white blood cell important in the body's fight against infection. It is used to reduce the incidence of fever and infection in patients with certain types of cancer who are receiving chemotherapy that affects the bone marrow. This medicine may be used for other purposes; ask your health care provider or pharmacist if you have questions. COMMON  BRAND NAME(S): Neulasta What should I tell my health care provider before I take this medicine? They need to know if you have any of these conditions: -latex allergy -ongoing radiation therapy -sickle cell disease -skin reactions to acrylic adhesives (On-Body Injector only) -an unusual or allergic reaction to pegfilgrastim, filgrastim, other medicines, foods, dyes, or preservatives -pregnant or trying to get pregnant -breast-feeding How should I use this medicine? This medicine is for injection under the skin. If you get this medicine at home, you will be taught how to prepare and give the pre-filled syringe or how to use the On-body Injector. Refer to the patient Instructions for Use for detailed instructions. Use exactly as directed. Take your medicine at regular intervals. Do not take your medicine more often than directed. It is important that you put your used needles and syringes in a special sharps container. Do not put them in a trash can. If you do not have a sharps container, call your pharmacist or healthcare provider to get one. Talk to your pediatrician regarding the use of this medicine in children. Special care may be needed. Overdosage: If you think you have taken too much of this medicine contact a poison control center or emergency room at once. NOTE: This medicine is only for you. Do not share this medicine with others. What if I miss a dose? It is important not to miss your dose. Call your doctor or health care professional if you miss your dose. If you miss a dose due to an On-body Injector failure or leakage, a new dose should be administered as soon as possible using a single prefilled  syringe for manual use. What may interact with this medicine? Interactions have not been studied. Give your health care provider a list of all the medicines, herbs, non-prescription drugs, or dietary supplements you use. Also tell them if you smoke, drink alcohol, or use illegal drugs. Some  items may interact with your medicine. This list may not describe all possible interactions. Give your health care provider a list of all the medicines, herbs, non-prescription drugs, or dietary supplements you use. Also tell them if you smoke, drink alcohol, or use illegal drugs. Some items may interact with your medicine. What should I watch for while using this medicine? You may need blood work done while you are taking this medicine. If you are going to need a MRI, CT scan, or other procedure, tell your doctor that you are using this medicine (On-Body Injector only). What side effects may I notice from receiving this medicine? Side effects that you should report to your doctor or health care professional as soon as possible: -allergic reactions like skin rash, itching or hives, swelling of the face, lips, or tongue -dizziness -fever -pain, redness, or irritation at site where injected -pinpoint red spots on the skin -shortness of breath or breathing problems -stomach or side pain, or pain at the shoulder -swelling -tiredness -trouble passing urine Side effects that usually do not require medical attention (report to your doctor or health care professional if they continue or are bothersome): -bone pain -muscle pain This list may not describe all possible side effects. Call your doctor for medical advice about side effects. You may report side effects to FDA at 1-800-FDA-1088. Where should I keep my medicine? Keep out of the reach of children. Store pre-filled syringes in a refrigerator between 2 and 8 degrees C (36 and 46 degrees F). Do not freeze. Keep in carton to protect from light. Throw away this medicine if it is left out of the refrigerator for more than 48 hours. Throw away any unused medicine after the expiration date. NOTE: This sheet is a summary. It may not cover all possible information. If you have questions about this medicine, talk to your doctor, pharmacist, or health  care provider.  2015, Elsevier/Gold Standard. (2013-09-19 16:14:05)

## 2014-07-10 NOTE — Progress Notes (Signed)
Patient Care Team: Cyndi Bender, PA-C as PCP - General (Physician Assistant)  DIAGNOSIS: No matching staging information was found for the patient.  SUMMARY OF ONCOLOGIC HISTORY:   Breast cancer of upper-outer quadrant of right female breast   05/27/2014 Mammogram Right breast mass by mammogram 5.4 cm by ultrasound 4 x 4 X 2 cm   05/28/2014 Initial Biopsy Right breast biopsy 11:30: Invasive ductal carcinoma with lymphovascular invasion grade 1/2, ER 100%, PR 97%, Ki-67 22%, HER-2 negative ratio 1.26 average copy #1.7, lymph node biopsy also positive   06/16/2014 Breast MRI right breast cancer: 3.6 cm at 12:00 position mildly enlarged right axillary lymph node: 5 mm oval enhancing mass in the central left breast could be an intramammary lymph node   07/10/2014 -  Neo-Adjuvant Chemotherapy Dose dense Adriamycin and Cytoxan 4 followed by Taxol 12   07/10/2014 Procedure PREVENT clinical trial (Lipitor vs Placebo) in patients undergoing cardiotoxic chemotherapy    CHIEF COMPLIANT: Cycle 1 day 1 of dose dense Adriamycin and Cytoxan  INTERVAL HISTORY: Marissa Haney is a 59 year old lady with above-mentioned history of right-sided breast cancer who is here to start neoadjuvant chemotherapy with dose dense Adriamycin and Cytoxan. Port has been placed and she had undergone echocardiogram and chemotherapy education. She is here to get to sign consent form and to start treatment. Denies any new problems or concerns.  REVIEW OF SYSTEMS:   Constitutional: Denies fevers, chills or abnormal weight loss Eyes: Denies blurriness of vision Ears, nose, mouth, throat, and face: Denies mucositis or sore throat Respiratory: Denies cough, dyspnea or wheezes Cardiovascular: Denies palpitation, chest discomfort or lower extremity swelling Gastrointestinal:  Denies nausea, heartburn or change in bowel habits Skin: Denies abnormal skin rashes Lymphatics: Denies new lymphadenopathy or easy  bruising Neurological:Denies numbness, tingling or new weaknesses Behavioral/Psych: Mood is stable, no new changes  All other systems were reviewed with the patient and are negative.  I have reviewed the past medical history, past surgical history, social history and family history with the patient and they are unchanged from previous note.  ALLERGIES:  has No Known Allergies.  MEDICATIONS:  Current Outpatient Prescriptions  Medication Sig Dispense Refill  . ALPRAZolam (XANAX) 0.25 MG tablet Take 0.25 mg by mouth 2 (two) times daily as needed for anxiety.    Marland Kitchen dexamethasone (DECADRON) 4 MG tablet Take 2 tablets by mouth once a day on the day after chemotherapy and then take 2 tablets two times a day for 2 days. Take with food. 30 tablet 1  . glipiZIDE (GLUCOTROL) 5 MG tablet Take 5 mg by mouth 2 (two) times daily before a meal.    . HYDROcodone-acetaminophen (NORCO) 5-325 MG per tablet Take 1-2 tablets by mouth every 6 (six) hours as needed for moderate pain or severe pain. 30 tablet 0  . lidocaine-prilocaine (EMLA) cream Apply to affected area once 30 g 3  . lisinopril (PRINIVIL,ZESTRIL) 10 MG tablet Take 10 mg by mouth daily.    Marland Kitchen LORazepam (ATIVAN) 0.5 MG tablet Take 1 tablet (0.5 mg total) by mouth every 6 (six) hours as needed (Nausea or vomiting). 30 tablet 0  . meclizine (ANTIVERT) 25 MG tablet Take 25 mg by mouth.    . metFORMIN (GLUCOPHAGE) 500 MG tablet Take 500 mg by mouth.    . metoprolol (LOPRESSOR) 50 MG tablet Take 1 tablet (50 mg total) by mouth 2 (two) times daily. 60 tablet 1  . ondansetron (ZOFRAN) 8 MG tablet Take 1 tablet (8 mg total)  by mouth 2 (two) times daily as needed. Start on the third day after chemotherapy. 30 tablet 1  . oxyCODONE-acetaminophen (PERCOCET/ROXICET) 5-325 MG per tablet Take by mouth.    . prochlorperazine (COMPAZINE) 10 MG tablet Take 1 tablet (10 mg total) by mouth every 6 (six) hours as needed (Nausea or vomiting). 30 tablet 1   No current  facility-administered medications for this visit.   Facility-Administered Medications Ordered in Other Visits  Medication Dose Route Frequency Provider Last Rate Last Dose  . cyclophosphamide (CYTOXAN) 1,360 mg in sodium chloride 0.9 % 250 mL chemo infusion  600 mg/m2 (Treatment Plan Actual) Intravenous Once Rulon Eisenmenger, MD      . DOXOrubicin (ADRIAMYCIN) chemo injection 136 mg  60 mg/m2 (Treatment Plan Actual) Intravenous Once Rulon Eisenmenger, MD      . fosaprepitant (EMEND) 150 mg in sodium chloride 0.9 % 145 mL IVPB  150 mg Intravenous Once Rulon Eisenmenger, MD 300 mL/hr at 07/10/14 1352 150 mg at 07/10/14 1352  . heparin lock flush 100 unit/mL  500 Units Intracatheter Once PRN Rulon Eisenmenger, MD      . sodium chloride 0.9 % injection 10 mL  10 mL Intracatheter PRN Rulon Eisenmenger, MD        PHYSICAL EXAMINATION: ECOG PERFORMANCE STATUS: 0 - Asymptomatic  Filed Vitals:   07/10/14 1147  BP: 205/72  Pulse: 92  Temp: 97.6 F (36.4 C)  Resp: 18   Filed Weights   07/10/14 1147  Weight: 243 lb 11.2 oz (110.542 kg)    GENERAL:alert, no distress and comfortable SKIN: skin color, texture, turgor are normal, no rashes or significant lesions EYES: normal, Conjunctiva are pink and non-injected, sclera clear OROPHARYNX:no exudate, no erythema and lips, buccal mucosa, and tongue normal  NECK: supple, thyroid normal size, non-tender, without nodularity LYMPH:  no palpable lymphadenopathy in the cervical, axillary or inguinal LUNGS: clear to auscultation and percussion with normal breathing effort HEART: regular rate & rhythm and no murmurs and no lower extremity edema ABDOMEN:abdomen soft, non-tender and normal bowel sounds Musculoskeletal:no cyanosis of digits and no clubbing  NEURO: alert & oriented x 3 with fluent speech, no focal motor/sensory deficits  LABORATORY DATA:  I have reviewed the data as listed   Chemistry      Component Value Date/Time   NA 141 07/10/2014 1104   NA 143  06/18/2014 1444   K 3.7 07/10/2014 1104   K 3.6* 06/18/2014 1444   CL 105 06/18/2014 1444   CO2 30* 07/10/2014 1104   CO2 26 06/18/2014 1444   BUN 14.9 07/10/2014 1104   BUN 11 06/18/2014 1444   CREATININE 0.9 07/10/2014 1104   CREATININE 0.67 06/18/2014 1444      Component Value Date/Time   CALCIUM 9.0 07/10/2014 1104   CALCIUM 9.0 06/18/2014 1444   ALKPHOS 148 07/10/2014 1104   ALKPHOS 138* 06/18/2014 1444   AST 17 07/10/2014 1104   AST 15 06/18/2014 1444   ALT 15 07/10/2014 1104   ALT 22 06/18/2014 1444   BILITOT 0.74 07/10/2014 1104   BILITOT 0.5 06/18/2014 1444       Lab Results  Component Value Date   WBC 10.9* 07/10/2014   HGB 13.8 07/10/2014   HCT 42.6 07/10/2014   MCV 94.5 07/10/2014   PLT 234 07/10/2014   NEUTROABS 6.2 07/10/2014     RADIOGRAPHIC STUDIES: I have personally reviewed the radiology reports and agreed with their findings. Echocardiogram showed EF 55-60%  ASSESSMENT &  PLAN:  Breast cancer of upper-outer quadrant of right female breast Right breast invasive ductal carcinoma 3.6 cm by MRI with enlarged right axillary lymph node biopsy proven to be positive for cancer , T2 N1 M0 stage IIB ER positive PR positive HER-2 negative Ki-67 22%  Current treatment:Dose dense Adriamycin and Cytoxan every 2 weeks 4 followed by Taxol weekly 12; today cycle 1 day 1  Chemotherapy monitoring: 1. Consent has been signed 2. I reviewed the antiemetic regimen with the patient 3. Chemotherapy education as been completed 4. Blood counts are adequate for treatment We'll monitor very closely for toxicities. Return to clinic in 1 week for follow-up and toxicity check     Orders Placed This Encounter  Procedures  . CBC with Differential    Standing Status: Future     Number of Occurrences:      Standing Expiration Date: 07/10/2015  . Comprehensive metabolic panel (Cmet) - [facility]    Standing Status: Future     Number of Occurrences:      Standing Expiration  Date: 07/10/2015   The patient has a good understanding of the overall plan. she agrees with it. She will call with any problems that may develop before her next visit here.   Rulon Eisenmenger, MD 07/10/2014 2:08 PM

## 2014-07-11 ENCOUNTER — Telehealth: Payer: Self-pay | Admitting: Hematology and Oncology

## 2014-07-11 ENCOUNTER — Telehealth: Payer: Self-pay | Admitting: *Deleted

## 2014-07-11 ENCOUNTER — Ambulatory Visit (HOSPITAL_BASED_OUTPATIENT_CLINIC_OR_DEPARTMENT_OTHER): Payer: Medicaid Other

## 2014-07-11 DIAGNOSIS — Z5189 Encounter for other specified aftercare: Secondary | ICD-10-CM

## 2014-07-11 DIAGNOSIS — C50411 Malignant neoplasm of upper-outer quadrant of right female breast: Secondary | ICD-10-CM

## 2014-07-11 DIAGNOSIS — C773 Secondary and unspecified malignant neoplasm of axilla and upper limb lymph nodes: Secondary | ICD-10-CM

## 2014-07-11 MED ORDER — PEGFILGRASTIM INJECTION 6 MG/0.6ML ~~LOC~~
6.0000 mg | PREFILLED_SYRINGE | Freq: Once | SUBCUTANEOUS | Status: AC
Start: 2014-07-11 — End: 2014-07-11
  Administered 2014-07-11: 6 mg via SUBCUTANEOUS
  Filled 2014-07-11: qty 0.6

## 2014-07-11 NOTE — Telephone Encounter (Signed)
Called patient to let her know not to take Claritin due to her BP being elevated. Patient stated that she has had a headache since last night. Advised patient that headache is a side effect with Zofran, and patient may take Tylenol. Patient verbalized understanding.

## 2014-07-11 NOTE — Telephone Encounter (Signed)
Was not home yet from injection appointment. Called mobile # and left VM.

## 2014-07-11 NOTE — Patient Instructions (Signed)
Pegfilgrastim injection What is this medicine? PEGFILGRASTIM (peg fil GRA stim) is a long-acting granulocyte colony-stimulating factor that stimulates the growth of neutrophils, a type of white blood cell important in the body's fight against infection. It is used to reduce the incidence of fever and infection in patients with certain types of cancer who are receiving chemotherapy that affects the bone marrow. This medicine may be used for other purposes; ask your health care provider or pharmacist if you have questions. COMMON BRAND NAME(S): Neulasta What should I tell my health care provider before I take this medicine? They need to know if you have any of these conditions: -latex allergy -ongoing radiation therapy -sickle cell disease -skin reactions to acrylic adhesives (On-Body Injector only) -an unusual or allergic reaction to pegfilgrastim, filgrastim, other medicines, foods, dyes, or preservatives -pregnant or trying to get pregnant -breast-feeding How should I use this medicine? This medicine is for injection under the skin. If you get this medicine at home, you will be taught how to prepare and give the pre-filled syringe or how to use the On-body Injector. Refer to the patient Instructions for Use for detailed instructions. Use exactly as directed. Take your medicine at regular intervals. Do not take your medicine more often than directed. It is important that you put your used needles and syringes in a special sharps container. Do not put them in a trash can. If you do not have a sharps container, call your pharmacist or healthcare provider to get one. Talk to your pediatrician regarding the use of this medicine in children. Special care may be needed. Overdosage: If you think you have taken too much of this medicine contact a poison control center or emergency room at once. NOTE: This medicine is only for you. Do not share this medicine with others. What if I miss a dose? It is  important not to miss your dose. Call your doctor or health care professional if you miss your dose. If you miss a dose due to an On-body Injector failure or leakage, a new dose should be administered as soon as possible using a single prefilled syringe for manual use. What may interact with this medicine? Interactions have not been studied. Give your health care provider a list of all the medicines, herbs, non-prescription drugs, or dietary supplements you use. Also tell them if you smoke, drink alcohol, or use illegal drugs. Some items may interact with your medicine. This list may not describe all possible interactions. Give your health care provider a list of all the medicines, herbs, non-prescription drugs, or dietary supplements you use. Also tell them if you smoke, drink alcohol, or use illegal drugs. Some items may interact with your medicine. What should I watch for while using this medicine? You may need blood work done while you are taking this medicine. If you are going to need a MRI, CT scan, or other procedure, tell your doctor that you are using this medicine (On-Body Injector only). What side effects may I notice from receiving this medicine? Side effects that you should report to your doctor or health care professional as soon as possible: -allergic reactions like skin rash, itching or hives, swelling of the face, lips, or tongue -dizziness -fever -pain, redness, or irritation at site where injected -pinpoint red spots on the skin -shortness of breath or breathing problems -stomach or side pain, or pain at the shoulder -swelling -tiredness -trouble passing urine Side effects that usually do not require medical attention (report to your doctor   or health care professional if they continue or are bothersome): -bone pain -muscle pain This list may not describe all possible side effects. Call your doctor for medical advice about side effects. You may report side effects to FDA at  1-800-FDA-1088. Where should I keep my medicine? Keep out of the reach of children. Store pre-filled syringes in a refrigerator between 2 and 8 degrees C (36 and 46 degrees F). Do not freeze. Keep in carton to protect from light. Throw away this medicine if it is left out of the refrigerator for more than 48 hours. Throw away any unused medicine after the expiration date. NOTE: This sheet is a summary. It may not cover all possible information. If you have questions about this medicine, talk to your doctor, pharmacist, or health care provider.  2015, Elsevier/Gold Standard. (2013-09-19 16:14:05)  

## 2014-07-11 NOTE — Telephone Encounter (Signed)
, °

## 2014-07-16 ENCOUNTER — Other Ambulatory Visit: Payer: Self-pay | Admitting: *Deleted

## 2014-07-17 ENCOUNTER — Ambulatory Visit (HOSPITAL_BASED_OUTPATIENT_CLINIC_OR_DEPARTMENT_OTHER): Payer: Medicaid Other

## 2014-07-17 ENCOUNTER — Ambulatory Visit (HOSPITAL_BASED_OUTPATIENT_CLINIC_OR_DEPARTMENT_OTHER): Payer: Medicaid Other | Admitting: Hematology and Oncology

## 2014-07-17 ENCOUNTER — Other Ambulatory Visit: Payer: Self-pay | Admitting: *Deleted

## 2014-07-17 DIAGNOSIS — C50411 Malignant neoplasm of upper-outer quadrant of right female breast: Secondary | ICD-10-CM

## 2014-07-17 DIAGNOSIS — Z171 Estrogen receptor negative status [ER-]: Secondary | ICD-10-CM

## 2014-07-17 DIAGNOSIS — C773 Secondary and unspecified malignant neoplasm of axilla and upper limb lymph nodes: Secondary | ICD-10-CM

## 2014-07-17 DIAGNOSIS — R5383 Other fatigue: Secondary | ICD-10-CM

## 2014-07-17 DIAGNOSIS — M791 Myalgia: Secondary | ICD-10-CM

## 2014-07-17 LAB — COMPREHENSIVE METABOLIC PANEL (CC13)
ALK PHOS: 161 U/L — AB (ref 40–150)
ALT: 20 U/L (ref 0–55)
AST: 10 U/L (ref 5–34)
Albumin: 3.6 g/dL (ref 3.5–5.0)
Anion Gap: 8 mEq/L (ref 3–11)
BUN: 15.9 mg/dL (ref 7.0–26.0)
CALCIUM: 8.3 mg/dL — AB (ref 8.4–10.4)
CHLORIDE: 103 meq/L (ref 98–109)
CO2: 28 mEq/L (ref 22–29)
Creatinine: 0.8 mg/dL (ref 0.6–1.1)
Glucose: 140 mg/dl (ref 70–140)
Potassium: 3.9 mEq/L (ref 3.5–5.1)
Sodium: 139 mEq/L (ref 136–145)
Total Bilirubin: 1.06 mg/dL (ref 0.20–1.20)
Total Protein: 7.2 g/dL (ref 6.4–8.3)

## 2014-07-17 LAB — CBC WITH DIFFERENTIAL/PLATELET
BASO%: 0.3 % (ref 0.0–2.0)
Basophils Absolute: 0 10*3/uL (ref 0.0–0.1)
EOS ABS: 0.2 10*3/uL (ref 0.0–0.5)
EOS%: 5.6 % (ref 0.0–7.0)
HEMATOCRIT: 41.1 % (ref 34.8–46.6)
HGB: 13.6 g/dL (ref 11.6–15.9)
LYMPH%: 72 % — ABNORMAL HIGH (ref 14.0–49.7)
MCH: 30.6 pg (ref 25.1–34.0)
MCHC: 33.1 g/dL (ref 31.5–36.0)
MCV: 92.4 fL (ref 79.5–101.0)
MONO#: 0.1 10*3/uL (ref 0.1–0.9)
MONO%: 1.3 % (ref 0.0–14.0)
NEUT#: 0.8 10*3/uL — ABNORMAL LOW (ref 1.5–6.5)
NEUT%: 20.8 % — ABNORMAL LOW (ref 38.4–76.8)
PLATELETS: 94 10*3/uL — AB (ref 145–400)
RBC: 4.45 10*6/uL (ref 3.70–5.45)
RDW: 12.6 % (ref 11.2–14.5)
WBC: 3.7 10*3/uL — ABNORMAL LOW (ref 3.9–10.3)
lymph#: 2.7 10*3/uL (ref 0.9–3.3)
nRBC: 0 % (ref 0–0)

## 2014-07-17 MED ORDER — MECLIZINE HCL 25 MG PO TABS: 25.0000 mg | ORAL_TABLET | Freq: Two times a day (BID) | ORAL | Status: DC

## 2014-07-17 NOTE — Assessment & Plan Note (Signed)
Right breast invasive ductal carcinoma 3.6 cm by MRI with enlarged right axillary lymph node biopsy proven to be positive for cancer , T2 N1 M0 stage IIB ER positive PR positive HER-2 negative Ki-67 22%  Current treatment:Dose dense Adriamycin and Cytoxan every 2 weeks 4 followed by Taxol weekly 12; today cycle 1 day 8  Toxicities to chemotherapy: 1. Tiredness 2. Muscle aches and pains Monitoring closely for chemotherapy toxicities Patient did not have blood work drawn today. We will send her to lab to get blood work done. Return to clinic in 1 week for cycle 2

## 2014-07-17 NOTE — Progress Notes (Signed)
Patient Care Team: Cyndi Bender, PA-C as PCP - General (Physician Assistant)  DIAGNOSIS: No matching staging information was found for the patient.  SUMMARY OF ONCOLOGIC HISTORY:   Breast cancer of upper-outer quadrant of right female breast   05/27/2014 Mammogram Right breast mass by mammogram 5.4 cm by ultrasound 4 x 4 X 2 cm   05/28/2014 Initial Biopsy Right breast biopsy 11:30: Invasive ductal carcinoma with lymphovascular invasion grade 1/2, ER 100%, PR 97%, Ki-67 22%, HER-2 negative ratio 1.26 average copy #1.7, lymph node biopsy also positive   06/16/2014 Breast MRI right breast cancer: 3.6 cm at 12:00 position mildly enlarged right axillary lymph node: 5 mm oval enhancing mass in the central left breast could be an intramammary lymph node   07/10/2014 -  Neo-Adjuvant Chemotherapy Dose dense Adriamycin and Cytoxan 4 followed by Taxol 12   07/10/2014 Procedure PREVENT clinical trial (Lipitor vs Placebo) in patients undergoing cardiotoxic chemotherapy    CHIEF COMPLIANT: Cycle 1 day 8 dose dense Adriamycin and Cytoxan  INTERVAL HISTORY: Marissa Haney is a 59 year old lady with above-mentioned history of right-sided breast cancer currently on neoadjuvant chemotherapy with dose dense Adriamycin and Cytoxan. She received cycle 1 treatment a week ago and is here today for toxicity check. She reports no major problems or concerns. She had tiredness for 3-4 days after chemotherapy and muscle aches and pains but otherwise she is recovered from all of this and she feels back to her normal self. She had a wrist fracture in the right arm and she underwent aost placement and apparently is healing well.  REVIEW OF SYSTEMS:   Constitutional: Denies fevers, chills or abnormal weight loss; fatigue  Eyes: Denies blurriness of vision Ears, nose, mouth, throat, and face: Denies mucositis or sore throat Respiratory: Denies cough, dyspnea or wheezes Cardiovascular: Denies palpitation, chest discomfort or  lower extremity swelling Gastrointestinal:  Denies nausea, heartburn or change in bowel habits Skin: Denies abnormal skin rashes Lymphatics: Denies new lymphadenopathy or easy bruising Neurological:Denies numbness, tingling or new weaknesses Behavioral/Psych: Mood is stable, no new changes   All other systems were reviewed with the patient and are negative.  I have reviewed the past medical history, past surgical history, social history and family history with the patient and they are unchanged from previous note.  ALLERGIES:  has No Known Allergies.  MEDICATIONS:  Current Outpatient Prescriptions  Medication Sig Dispense Refill  . ALPRAZolam (XANAX) 0.25 MG tablet Take 0.25 mg by mouth 2 (two) times daily as needed for anxiety.    Marland Kitchen dexamethasone (DECADRON) 4 MG tablet Take 2 tablets by mouth once a day on the day after chemotherapy and then take 2 tablets two times a day for 2 days. Take with food. 30 tablet 1  . glipiZIDE (GLUCOTROL) 5 MG tablet Take 5 mg by mouth 2 (two) times daily before a meal.    . HYDROcodone-acetaminophen (NORCO) 5-325 MG per tablet Take 1-2 tablets by mouth every 6 (six) hours as needed for moderate pain or severe pain. 30 tablet 0  . lidocaine-prilocaine (EMLA) cream Apply to affected area once 30 g 3  . lisinopril (PRINIVIL,ZESTRIL) 10 MG tablet Take 10 mg by mouth daily.    Marland Kitchen LORazepam (ATIVAN) 0.5 MG tablet Take 1 tablet (0.5 mg total) by mouth every 6 (six) hours as needed (Nausea or vomiting). 30 tablet 0  . meclizine (ANTIVERT) 25 MG tablet Take 1 tablet (25 mg total) by mouth 2 (two) times daily. 60 tablet 3  .  metFORMIN (GLUCOPHAGE) 500 MG tablet Take 500 mg by mouth.    . metoprolol (LOPRESSOR) 50 MG tablet Take 1 tablet (50 mg total) by mouth 2 (two) times daily. 60 tablet 1  . ondansetron (ZOFRAN) 8 MG tablet Take 1 tablet (8 mg total) by mouth 2 (two) times daily as needed. Start on the third day after chemotherapy. 30 tablet 1  . oxyCODONE (OXY  IR/ROXICODONE) 5 MG immediate release tablet 1-2 tabs po Q4-6 hrs prn pain    . oxyCODONE-acetaminophen (PERCOCET/ROXICET) 5-325 MG per tablet Take by mouth.    . prochlorperazine (COMPAZINE) 10 MG tablet Take 1 tablet (10 mg total) by mouth every 6 (six) hours as needed (Nausea or vomiting). 30 tablet 1   No current facility-administered medications for this visit.    PHYSICAL EXAMINATION: ECOG PERFORMANCE STATUS: 1 - Symptomatic but completely ambulatory  Filed Vitals:   07/17/14 1131  BP: 156/59  Pulse: 89  Temp: 98.2 F (36.8 C)  Resp: 19   Filed Weights   07/17/14 1131  Weight: 240 lb 1.6 oz (108.909 kg)    GENERAL:alert, no distress and comfortable SKIN: skin color, texture, turgor are normal, no rashes or significant lesions EYES: normal, Conjunctiva are pink and non-injected, sclera clear OROPHARYNX:no exudate, no erythema and lips, buccal mucosa, and tongue normal  NECK: supple, thyroid normal size, non-tender, without nodularity LYMPH:  no palpable lymphadenopathy in the cervical, axillary or inguinal LUNGS: clear to auscultation and percussion with normal breathing effort HEART: regular rate & rhythm and no murmurs and no lower extremity edema ABDOMEN:abdomen soft, non-tender and normal bowel sounds Musculoskeletal:no cyanosis of digits and no clubbing  NEURO: alert & oriented x 3 with fluent speech, no focal motor/sensory deficits Cast on the right arm   LABORATORY DATA:  I have reviewed the data as listed   Chemistry      Component Value Date/Time   NA 141 07/10/2014 1104   NA 143 06/18/2014 1444   K 3.7 07/10/2014 1104   K 3.6* 06/18/2014 1444   CL 105 06/18/2014 1444   CO2 30* 07/10/2014 1104   CO2 26 06/18/2014 1444   BUN 14.9 07/10/2014 1104   BUN 11 06/18/2014 1444   CREATININE 0.9 07/10/2014 1104   CREATININE 0.67 06/18/2014 1444      Component Value Date/Time   CALCIUM 9.0 07/10/2014 1104   CALCIUM 9.0 06/18/2014 1444   ALKPHOS 148  07/10/2014 1104   ALKPHOS 138* 06/18/2014 1444   AST 17 07/10/2014 1104   AST 15 06/18/2014 1444   ALT 15 07/10/2014 1104   ALT 22 06/18/2014 1444   BILITOT 0.74 07/10/2014 1104   BILITOT 0.5 06/18/2014 1444       Lab Results  Component Value Date   WBC 10.9* 07/10/2014   HGB 13.8 07/10/2014   HCT 42.6 07/10/2014   MCV 94.5 07/10/2014   PLT 234 07/10/2014   NEUTROABS 6.2 07/10/2014    ASSESSMENT & PLAN:  Breast cancer of upper-outer quadrant of right female breast Right breast invasive ductal carcinoma 3.6 cm by MRI with enlarged right axillary lymph node biopsy proven to be positive for cancer , T2 N1 M0 stage IIB ER positive PR positive HER-2 negative Ki-67 22%  Current treatment:Dose dense Adriamycin and Cytoxan every 2 weeks 4 followed by Taxol weekly 12; today cycle 1 day 8  Toxicities to chemotherapy: 1. Tiredness 2. Muscle aches and pains Monitoring closely for chemotherapy toxicities Patient did not have blood work drawn today. We  will send her to lab to get blood work done. Return to clinic in 1 week for cycle 2      Orders Placed This Encounter  Procedures  . CBC with Differential    Standing Status: Future     Number of Occurrences:      Standing Expiration Date: 07/17/2015  . Comprehensive metabolic panel (Cmet) - [facility]    Standing Status: Future     Number of Occurrences:      Standing Expiration Date: 07/17/2015   The patient has a good understanding of the overall plan. she agrees with it. She will call with any problems that may develop before her next visit here.   Rulon Eisenmenger, MD

## 2014-07-24 ENCOUNTER — Ambulatory Visit (HOSPITAL_BASED_OUTPATIENT_CLINIC_OR_DEPARTMENT_OTHER): Payer: Medicaid Other

## 2014-07-24 ENCOUNTER — Ambulatory Visit (HOSPITAL_BASED_OUTPATIENT_CLINIC_OR_DEPARTMENT_OTHER): Payer: Medicaid Other | Admitting: Hematology and Oncology

## 2014-07-24 ENCOUNTER — Encounter: Payer: Self-pay | Admitting: Hematology and Oncology

## 2014-07-24 ENCOUNTER — Other Ambulatory Visit (HOSPITAL_BASED_OUTPATIENT_CLINIC_OR_DEPARTMENT_OTHER): Payer: Medicaid Other

## 2014-07-24 ENCOUNTER — Telehealth: Payer: Self-pay | Admitting: Hematology and Oncology

## 2014-07-24 ENCOUNTER — Telehealth: Payer: Self-pay | Admitting: *Deleted

## 2014-07-24 ENCOUNTER — Other Ambulatory Visit: Payer: Self-pay | Admitting: Hematology and Oncology

## 2014-07-24 VITALS — BP 196/73 | HR 68 | Temp 97.6°F | Resp 20 | Ht 65.0 in | Wt 241.6 lb

## 2014-07-24 DIAGNOSIS — Z5111 Encounter for antineoplastic chemotherapy: Secondary | ICD-10-CM

## 2014-07-24 DIAGNOSIS — C773 Secondary and unspecified malignant neoplasm of axilla and upper limb lymph nodes: Secondary | ICD-10-CM

## 2014-07-24 DIAGNOSIS — C50411 Malignant neoplasm of upper-outer quadrant of right female breast: Secondary | ICD-10-CM

## 2014-07-24 DIAGNOSIS — Z170 Estrogen receptor positive status [ER+]: Secondary | ICD-10-CM | POA: Diagnosis not present

## 2014-07-24 LAB — CBC WITH DIFFERENTIAL/PLATELET
BASO%: 0.6 % (ref 0.0–2.0)
Basophils Absolute: 0 10*3/uL (ref 0.0–0.1)
EOS ABS: 0.1 10*3/uL (ref 0.0–0.5)
EOS%: 1.2 % (ref 0.0–7.0)
HCT: 39.1 % (ref 34.8–46.6)
HGB: 12.6 g/dL (ref 11.6–15.9)
LYMPH#: 3.4 10*3/uL — AB (ref 0.9–3.3)
LYMPH%: 42.2 % (ref 14.0–49.7)
MCH: 30.1 pg (ref 25.1–34.0)
MCHC: 32.3 g/dL (ref 31.5–36.0)
MCV: 93.3 fL (ref 79.5–101.0)
MONO#: 0.6 10*3/uL (ref 0.1–0.9)
MONO%: 7.9 % (ref 0.0–14.0)
NEUT#: 3.9 10*3/uL (ref 1.5–6.5)
NEUT%: 48.1 % (ref 38.4–76.8)
Platelets: 243 10*3/uL (ref 145–400)
RBC: 4.19 10*6/uL (ref 3.70–5.45)
RDW: 12.7 % (ref 11.2–14.5)
WBC: 8.1 10*3/uL (ref 3.9–10.3)

## 2014-07-24 LAB — COMPREHENSIVE METABOLIC PANEL (CC13)
ALBUMIN: 3.6 g/dL (ref 3.5–5.0)
ALT: 38 U/L (ref 0–55)
AST: 20 U/L (ref 5–34)
Alkaline Phosphatase: 173 U/L — ABNORMAL HIGH (ref 40–150)
Anion Gap: 9 mEq/L (ref 3–11)
BUN: 9.6 mg/dL (ref 7.0–26.0)
CO2: 28 meq/L (ref 22–29)
CREATININE: 0.8 mg/dL (ref 0.6–1.1)
Calcium: 8.4 mg/dL (ref 8.4–10.4)
Chloride: 106 mEq/L (ref 98–109)
Glucose: 181 mg/dl — ABNORMAL HIGH (ref 70–140)
Potassium: 3.8 mEq/L (ref 3.5–5.1)
SODIUM: 143 meq/L (ref 136–145)
Total Bilirubin: 0.35 mg/dL (ref 0.20–1.20)
Total Protein: 7.2 g/dL (ref 6.4–8.3)

## 2014-07-24 MED ORDER — DEXAMETHASONE SODIUM PHOSPHATE 20 MG/5ML IJ SOLN
INTRAMUSCULAR | Status: AC
  Filled 2014-07-24: qty 5

## 2014-07-24 MED ORDER — SODIUM CHLORIDE 0.9 % IV SOLN
150.0000 mg | Freq: Once | INTRAVENOUS | Status: AC
  Administered 2014-07-24: 150 mg via INTRAVENOUS
  Filled 2014-07-24: qty 5

## 2014-07-24 MED ORDER — LORAZEPAM 0.5 MG PO TABS: 0.5000 mg | ORAL_TABLET | Freq: Four times a day (QID) | ORAL | Status: DC | PRN

## 2014-07-24 MED ORDER — SODIUM CHLORIDE 0.9 % IJ SOLN
10.0000 mL | INTRAMUSCULAR | Status: DC | PRN
  Administered 2014-07-24: 10 mL
  Filled 2014-07-24: qty 10

## 2014-07-24 MED ORDER — PEGFILGRASTIM 6 MG/0.6ML ~~LOC~~ PSKT
6.0000 mg | PREFILLED_SYRINGE | Freq: Once | SUBCUTANEOUS | Status: AC
  Administered 2014-07-24: 6 mg via SUBCUTANEOUS
  Filled 2014-07-24: qty 0.6

## 2014-07-24 MED ORDER — DOXORUBICIN HCL CHEMO IV INJECTION 2 MG/ML
60.0000 mg/m2 | Freq: Once | INTRAVENOUS | Status: AC
  Administered 2014-07-24: 136 mg via INTRAVENOUS
  Filled 2014-07-24: qty 68

## 2014-07-24 MED ORDER — DEXAMETHASONE SODIUM PHOSPHATE 20 MG/5ML IJ SOLN
12.0000 mg | Freq: Once | INTRAMUSCULAR | Status: AC
  Administered 2014-07-24: 12 mg via INTRAVENOUS

## 2014-07-24 MED ORDER — SODIUM CHLORIDE 0.9 % IV SOLN
600.0000 mg/m2 | Freq: Once | INTRAVENOUS | Status: AC
  Administered 2014-07-24: 1360 mg via INTRAVENOUS
  Filled 2014-07-24: qty 68

## 2014-07-24 MED ORDER — PALONOSETRON HCL INJECTION 0.25 MG/5ML
0.2500 mg | Freq: Once | INTRAVENOUS | Status: AC
  Administered 2014-07-24: 0.25 mg via INTRAVENOUS

## 2014-07-24 MED ORDER — SODIUM CHLORIDE 0.9 % IV SOLN
Freq: Once | INTRAVENOUS | Status: AC
  Administered 2014-07-24: 11:00:00 via INTRAVENOUS

## 2014-07-24 MED ORDER — ALPRAZOLAM 0.25 MG PO TABS: 0.2500 mg | ORAL_TABLET | Freq: Two times a day (BID) | ORAL | Status: DC | PRN

## 2014-07-24 MED ORDER — PALONOSETRON HCL INJECTION 0.25 MG/5ML
INTRAVENOUS | Status: AC
Start: 2014-07-24 — End: 2014-07-24
  Filled 2014-07-24: qty 5

## 2014-07-24 MED ORDER — HEPARIN SOD (PORK) LOCK FLUSH 100 UNIT/ML IV SOLN
500.0000 [IU] | Freq: Once | INTRAVENOUS | Status: AC | PRN
  Administered 2014-07-24: 500 [IU]
  Filled 2014-07-24: qty 5

## 2014-07-24 NOTE — Telephone Encounter (Signed)
, °

## 2014-07-24 NOTE — Progress Notes (Signed)
Patient Care Team: Cyndi Bender, PA-C as PCP - General (Physician Assistant)  DIAGNOSIS: No matching staging information was found for the patient.  SUMMARY OF ONCOLOGIC HISTORY:   Breast cancer of upper-outer quadrant of right female breast   05/27/2014 Mammogram Right breast mass by mammogram 5.4 cm by ultrasound 4 x 4 X 2 cm   05/28/2014 Initial Biopsy Right breast biopsy 11:30: Invasive ductal carcinoma with lymphovascular invasion grade 1/2, ER 100%, PR 97%, Ki-67 22%, HER-2 negative ratio 1.26 average copy #1.7, lymph node biopsy also positive   06/16/2014 Breast MRI right breast cancer: 3.6 cm at 12:00 position mildly enlarged right axillary lymph node: 5 mm oval enhancing mass in the central left breast could be an intramammary lymph node   07/10/2014 -  Neo-Adjuvant Chemotherapy Dose dense Adriamycin and Cytoxan 4 followed by Taxol 12   07/10/2014 Procedure PREVENT clinical trial (Lipitor vs Placebo) in patients undergoing cardiotoxic chemotherapy    CHIEF COMPLIANT: cycle 2 day 1 dose dense Adriamycin and Cytoxan  INTERVAL HISTORY: Marissa Haney is a 59 year old lady with above-mentioned history of right breast cancer currently on neoadjuvant chemotherapy with dose dense Adriamycin and Cytoxan. She tolerated cycle 1 fairly well and today cycle #2. She denies any nausea or vomiting. Denies any abdominal pain, constipation or diarrhea.   REVIEW OF SYSTEMS:   Constitutional: Denies fevers, chills or abnormal weight loss Eyes: Denies blurriness of vision Ears, nose, mouth, throat, and face: Denies mucositis or sore throat Respiratory: Denies cough, dyspnea or wheezes Cardiovascular: Denies palpitation, chest discomfort or lower extremity swelling Gastrointestinal:  Denies nausea, heartburn or change in bowel habits Skin: Denies abnormal skin rashes Lymphatics: Denies new lymphadenopathy or easy bruising Neurological:Denies numbness, tingling or new weaknesses Behavioral/Psych:  Mood is stable, no new changes  All other systems were reviewed with the patient and are negative.  I have reviewed the past medical history, past surgical history, social history and family history with the patient and they are unchanged from previous note.  ALLERGIES:  has No Known Allergies.  MEDICATIONS:  Current Outpatient Prescriptions  Medication Sig Dispense Refill  . ALPRAZolam (XANAX) 0.25 MG tablet Take 0.25 mg by mouth 2 (two) times daily as needed for anxiety.    Marland Kitchen dexamethasone (DECADRON) 4 MG tablet Take 2 tablets by mouth once a day on the day after chemotherapy and then take 2 tablets two times a day for 2 days. Take with food. 30 tablet 1  . glipiZIDE (GLUCOTROL) 5 MG tablet Take 5 mg by mouth 2 (two) times daily before a meal.    . HYDROcodone-acetaminophen (NORCO) 5-325 MG per tablet Take 1-2 tablets by mouth every 6 (six) hours as needed for moderate pain or severe pain. 30 tablet 0  . lidocaine-prilocaine (EMLA) cream Apply to affected area once 30 g 3  . lisinopril (PRINIVIL,ZESTRIL) 10 MG tablet Take 10 mg by mouth daily.    Marland Kitchen LORazepam (ATIVAN) 0.5 MG tablet Take 1 tablet (0.5 mg total) by mouth every 6 (six) hours as needed (Nausea or vomiting). 30 tablet 0  . meclizine (ANTIVERT) 25 MG tablet Take 1 tablet (25 mg total) by mouth 2 (two) times daily. 60 tablet 3  . metFORMIN (GLUCOPHAGE) 500 MG tablet Take 500 mg by mouth.    . metoprolol (LOPRESSOR) 50 MG tablet Take 1 tablet (50 mg total) by mouth 2 (two) times daily. 60 tablet 1  . ondansetron (ZOFRAN) 8 MG tablet Take 1 tablet (8 mg total) by mouth 2 (  two) times daily as needed. Start on the third day after chemotherapy. 30 tablet 1  . oxyCODONE (OXY IR/ROXICODONE) 5 MG immediate release tablet 1-2 tabs po Q4-6 hrs prn pain    . oxyCODONE-acetaminophen (PERCOCET/ROXICET) 5-325 MG per tablet Take by mouth.    . prochlorperazine (COMPAZINE) 10 MG tablet Take 1 tablet (10 mg total) by mouth every 6 (six) hours as  needed (Nausea or vomiting). 30 tablet 1   No current facility-administered medications for this visit.    PHYSICAL EXAMINATION: ECOG PERFORMANCE STATUS: 0 - Asymptomatic  There were no vitals filed for this visit. There were no vitals filed for this visit.  GENERAL:alert, no distress and comfortable SKIN: skin color, texture, turgor are normal, no rashes or significant lesions EYES: normal, Conjunctiva are pink and non-injected, sclera clear OROPHARYNX:no exudate, no erythema and lips, buccal mucosa, and tongue normal  NECK: supple, thyroid normal size, non-tender, without nodularity LYMPH:  no palpable lymphadenopathy in the cervical, axillary or inguinal LUNGS: clear to auscultation and percussion with normal breathing effort HEART: regular rate & rhythm and no murmurs and no lower extremity edema ABDOMEN:abdomen soft, non-tender and normal bowel sounds Musculoskeletal:no cyanosis of digits and no clubbing  NEURO: alert & oriented x 3 with fluent speech, no focal motor/sensory deficits  LABORATORY DATA:  I have reviewed the data as listed   Chemistry      Component Value Date/Time   NA 139 07/17/2014 1225   NA 143 06/18/2014 1444   K 3.9 07/17/2014 1225   K 3.6* 06/18/2014 1444   CL 105 06/18/2014 1444   CO2 28 07/17/2014 1225   CO2 26 06/18/2014 1444   BUN 15.9 07/17/2014 1225   BUN 11 06/18/2014 1444   CREATININE 0.8 07/17/2014 1225   CREATININE 0.67 06/18/2014 1444      Component Value Date/Time   CALCIUM 8.3* 07/17/2014 1225   CALCIUM 9.0 06/18/2014 1444   ALKPHOS 161* 07/17/2014 1225   ALKPHOS 138* 06/18/2014 1444   AST 10 07/17/2014 1225   AST 15 06/18/2014 1444   ALT 20 07/17/2014 1225   ALT 22 06/18/2014 1444   BILITOT 1.06 07/17/2014 1225   BILITOT 0.5 06/18/2014 1444       Lab Results  Component Value Date   WBC 8.1 07/24/2014   HGB 12.6 07/24/2014   HCT 39.1 07/24/2014   MCV 93.3 07/24/2014   PLT 243 07/24/2014   NEUTROABS 3.9 07/24/2014    ASSESSMENT & PLAN:  Breast cancer of upper-outer quadrant of right female breast Right breast invasive ductal carcinoma 3.6 cm by MRI with enlarged right axillary lymph node biopsy proven to be positive for cancer , T2 N1 M0 stage IIB ER positive PR positive HER-2 negative Ki-67 22%  Current treatment:Dose dense Adriamycin and Cytoxan every 2 weeks 4 followed by Taxol weekly 12; today cycle 2 day 1  Toxicities to chemotherapy: 1. Tiredness 2. Muscle aches and pains Monitoring closely for chemotherapy toxicities Blood work was reviewed and it is adequate for treatment Return to clinic in 2 weeks for cycle 3  Changing the last 2 on body injected Neulasta  No orders of the defined types were placed in this encounter.   The patient has a good understanding of the overall plan. she agrees with it. She will call with any problems that may develop before her next visit here.   Rulon Eisenmenger, MD

## 2014-07-24 NOTE — Patient Instructions (Addendum)
[facility] Discharge Instructions for Patients Receiving Chemotherapy  Today you received the following chemotherapy agents adriamycin/cytoxan  To help prevent nausea and vomiting after your treatment, we encourage you to take your nausea medication as directed  If you develop nausea and vomiting that is not controlled by your nausea medication, call the clinic.   BELOW ARE SYMPTOMS THAT SHOULD BE REPORTED IMMEDIATELY:  *FEVER GREATER THAN 100.5 F  *CHILLS WITH OR WITHOUT FEVER  NAUSEA AND VOMITING THAT IS NOT CONTROLLED WITH YOUR NAUSEA MEDICATION  *UNUSUAL SHORTNESS OF BREATH  *UNUSUAL BRUISING OR BLEEDING  TENDERNESS IN MOUTH AND THROAT WITH OR WITHOUT PRESENCE OF ULCERS  *URINARY PROBLEMS  *BOWEL PROBLEMS  UNUSUAL RASH Items with * indicate a potential emergency and should be followed up as soon as possible.  Feel free to call the clinic you have any questions or concerns. The clinic phone number is [phone].  

## 2014-07-24 NOTE — Progress Notes (Signed)
Neulasta body injector explained to patient, knows to call with any questions or concerns, when to remove, when to shower, when to call if injector is flashing red or becomes wet/dislogged. Explained to daughter via phone as well.

## 2014-07-24 NOTE — Assessment & Plan Note (Signed)
Right breast invasive ductal carcinoma 3.6 cm by MRI with enlarged right axillary lymph node biopsy proven to be positive for cancer , T2 N1 M0 stage IIB ER positive PR positive HER-2 negative Ki-67 22%  Current treatment:Dose dense Adriamycin and Cytoxan every 2 weeks 4 followed by Taxol weekly 12; today cycle 2 day 1  Toxicities to chemotherapy: 1. Tiredness 2. Muscle aches and pains Monitoring closely for chemotherapy toxicities Blood work was reviewed and it is adequate for treatment Return to clinic in 2 weeks for cycle 3

## 2014-07-24 NOTE — Telephone Encounter (Signed)
Per staff message and POF I have scheduled appts. Advised scheduler of appts. JMW  

## 2014-07-24 NOTE — Progress Notes (Signed)
Called and left a message for patient. She left several messages.

## 2014-07-25 ENCOUNTER — Ambulatory Visit: Payer: No Typology Code available for payment source

## 2014-07-28 ENCOUNTER — Other Ambulatory Visit: Payer: Self-pay

## 2014-07-28 ENCOUNTER — Ambulatory Visit (HOSPITAL_BASED_OUTPATIENT_CLINIC_OR_DEPARTMENT_OTHER): Payer: Medicaid Other

## 2014-07-28 ENCOUNTER — Encounter: Payer: Self-pay | Admitting: Pharmacist

## 2014-07-28 ENCOUNTER — Telehealth: Payer: Self-pay | Admitting: *Deleted

## 2014-07-28 DIAGNOSIS — C50411 Malignant neoplasm of upper-outer quadrant of right female breast: Secondary | ICD-10-CM

## 2014-07-28 DIAGNOSIS — Z5189 Encounter for other specified aftercare: Secondary | ICD-10-CM

## 2014-07-28 MED ORDER — PEGFILGRASTIM INJECTION 6 MG/0.6ML ~~LOC~~
6.0000 mg | PREFILLED_SYRINGE | Freq: Once | SUBCUTANEOUS | Status: DC
  Administered 2014-07-28: 6 mg via SUBCUTANEOUS
  Filled 2014-07-28: qty 0.6

## 2014-07-28 NOTE — Progress Notes (Signed)
Neulasta body injector returned to Pharmacy.

## 2014-07-28 NOTE — Progress Notes (Signed)
Pt called in re: OnPro device.  She removed the device too early.  Order placed for neulasta shot and appt made in flush for injection today.  Pt notified she needed to be here by 12 and to bring the OnPro with her so we can return to the company.  Pt voiced understanding.    Per Dr. Lindi Adie, change in future schedule not necessary.

## 2014-07-28 NOTE — Patient Instructions (Signed)
Pegfilgrastim injection What is this medicine? PEGFILGRASTIM (peg fil GRA stim) is a long-acting granulocyte colony-stimulating factor that stimulates the growth of neutrophils, a type of white blood cell important in the body's fight against infection. It is used to reduce the incidence of fever and infection in patients with certain types of cancer who are receiving chemotherapy that affects the bone marrow. This medicine may be used for other purposes; ask your health care provider or pharmacist if you have questions. COMMON BRAND NAME(S): Neulasta What should I tell my health care provider before I take this medicine? They need to know if you have any of these conditions: -latex allergy -ongoing radiation therapy -sickle cell disease -skin reactions to acrylic adhesives (On-Body Injector only) -an unusual or allergic reaction to pegfilgrastim, filgrastim, other medicines, foods, dyes, or preservatives -pregnant or trying to get pregnant -breast-feeding How should I use this medicine? This medicine is for injection under the skin. If you get this medicine at home, you will be taught how to prepare and give the pre-filled syringe or how to use the On-body Injector. Refer to the patient Instructions for Use for detailed instructions. Use exactly as directed. Take your medicine at regular intervals. Do not take your medicine more often than directed. It is important that you put your used needles and syringes in a special sharps container. Do not put them in a trash can. If you do not have a sharps container, call your pharmacist or healthcare provider to get one. Talk to your pediatrician regarding the use of this medicine in children. Special care may be needed. Overdosage: If you think you have taken too much of this medicine contact a poison control center or emergency room at once. NOTE: This medicine is only for you. Do not share this medicine with others. What if I miss a dose? It is  important not to miss your dose. Call your doctor or health care professional if you miss your dose. If you miss a dose due to an On-body Injector failure or leakage, a new dose should be administered as soon as possible using a single prefilled syringe for manual use. What may interact with this medicine? Interactions have not been studied. Give your health care provider a list of all the medicines, herbs, non-prescription drugs, or dietary supplements you use. Also tell them if you smoke, drink alcohol, or use illegal drugs. Some items may interact with your medicine. This list may not describe all possible interactions. Give your health care provider a list of all the medicines, herbs, non-prescription drugs, or dietary supplements you use. Also tell them if you smoke, drink alcohol, or use illegal drugs. Some items may interact with your medicine. What should I watch for while using this medicine? You may need blood work done while you are taking this medicine. If you are going to need a MRI, CT scan, or other procedure, tell your doctor that you are using this medicine (On-Body Injector only). What side effects may I notice from receiving this medicine? Side effects that you should report to your doctor or health care professional as soon as possible: -allergic reactions like skin rash, itching or hives, swelling of the face, lips, or tongue -dizziness -fever -pain, redness, or irritation at site where injected -pinpoint red spots on the skin -shortness of breath or breathing problems -stomach or side pain, or pain at the shoulder -swelling -tiredness -trouble passing urine Side effects that usually do not require medical attention (report to your doctor   or health care professional if they continue or are bothersome): -bone pain -muscle pain This list may not describe all possible side effects. Call your doctor for medical advice about side effects. You may report side effects to FDA at  1-800-FDA-1088. Where should I keep my medicine? Keep out of the reach of children. Store pre-filled syringes in a refrigerator between 2 and 8 degrees C (36 and 46 degrees F). Do not freeze. Keep in carton to protect from light. Throw away this medicine if it is left out of the refrigerator for more than 48 hours. Throw away any unused medicine after the expiration date. NOTE: This sheet is a summary. It may not cover all possible information. If you have questions about this medicine, talk to your doctor, pharmacist, or health care provider.  2015, Elsevier/Gold Standard. (2013-09-19 16:14:05)  

## 2014-07-28 NOTE — Telephone Encounter (Signed)
Pharmacist notified of patient call.  Per protocol patient can receive Neulasta.  The next treatment needs to be 14-days past receipt of the injection.  For example if she receives injection today, she'll need to receive next A/C  February 9th or later.  Patient can be reached at [phone]

## 2014-07-28 NOTE — Telephone Encounter (Signed)
Message received via voicemail 07-25-2014 at 1453 asking "if she can remove insert that is still blinking green but the time is up."  Called today at 0907 to inquire what she did with the OnPro.  "It beeped twice and I took it off at 3:20 pm.  I have a sheet that says the dose would start at 12:30." Will notify provider and explained to her that the insert was applied at 12:15 pm on 07-24-2014 and the dose was processing from 3:15 pm until 4:00 pm and should remain on an hour after which would be 5:00 pm.  Patient has been taking tylenol every six hours to prevent any pain.  Has not experienced any pain.

## 2014-07-28 NOTE — Progress Notes (Signed)
OK'd w/ MD that pts next A/C will be given as initially planned & NOT moved due to Neulasta OBI being removed by pt & Neulasta inj (regular) given 07/28/14.  Pt did return her Neulasta OBI & product return request has been submitted for replacement.   Kennith Center, Pharm.D., CPP 07/28/2014@12 :39 PM

## 2014-07-28 NOTE — Telephone Encounter (Signed)
Called patient notifying her to bring the Onpro device for pharmacist to return for credit and to come in today to receive injection.  Collaborative nurse given information for provider notification.

## 2014-07-30 ENCOUNTER — Telehealth: Payer: Self-pay | Admitting: *Deleted

## 2014-07-30 NOTE — Telephone Encounter (Signed)
Patient called and left voicemail retrieved at 1330 asking if she "may take tylenol or Nyquil.  I'm starting to have a cough and runny nose."  Called patient to learn "the symptoms are gone now.  No more runny nose or cough."  Asked if she has seasonal allergies and she denies.  Has not checked her temperature.  Reports she "has a thermometer but will have to wait till her daughter returns home to check her temperature".  Advised to call for temp >100.5 and to check her temperature with all new symptoms.  Reviewed neutropenic precautions, avoiding people who are sick by no kissing, hugs or hand shaking.  Advised to wash hands and use hand sanitizer.  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

## 2014-07-31 NOTE — Telephone Encounter (Signed)
Called patient to f/u symptoms.  Reports temperature = 97.5 and no further cough or runny nose.

## 2014-08-07 ENCOUNTER — Ambulatory Visit: Payer: No Typology Code available for payment source | Admitting: Hematology and Oncology

## 2014-08-07 ENCOUNTER — Ambulatory Visit: Payer: No Typology Code available for payment source

## 2014-08-07 ENCOUNTER — Other Ambulatory Visit: Payer: No Typology Code available for payment source

## 2014-08-07 NOTE — Assessment & Plan Note (Signed)
Right breast invasive ductal carcinoma 3.6 cm by MRI with enlarged right axillary lymph node biopsy proven to be positive for cancer , T2 N1 M0 stage IIB ER positive PR positive HER-2 negative Ki-67 22%  Current treatment:Dose dense Adriamycin and Cytoxan every 2 weeks 4 followed by Taxol weekly 12; today cycle 3 day 1  Toxicities to chemotherapy: 1. Tiredness 2. Muscle aches and pains 3. Hyperglycemia: Due to steroids   Monitoring closely for chemotherapy toxicities Blood work was reviewed and it is adequate for treatment Return to clinic in 2 weeks for cycle 4, I will plan on seeing her with the first dosage of Taxol in 4 weeks.

## 2014-08-12 ENCOUNTER — Telehealth: Payer: Self-pay | Admitting: Hematology and Oncology

## 2014-08-12 ENCOUNTER — Other Ambulatory Visit: Payer: Self-pay | Admitting: *Deleted

## 2014-08-12 ENCOUNTER — Ambulatory Visit (HOSPITAL_BASED_OUTPATIENT_CLINIC_OR_DEPARTMENT_OTHER): Payer: Medicaid Other

## 2014-08-12 ENCOUNTER — Ambulatory Visit (HOSPITAL_COMMUNITY)
Admission: RE | Admit: 2014-08-12 | Discharge: 2014-08-12 | Disposition: A | Discharge status: Home / Self Care | Payer: Medicaid Other | Source: Ambulatory Visit | Attending: Hematology and Oncology | Admitting: Hematology and Oncology

## 2014-08-12 ENCOUNTER — Other Ambulatory Visit: Payer: Self-pay

## 2014-08-12 ENCOUNTER — Other Ambulatory Visit: Payer: Self-pay | Admitting: Hematology and Oncology

## 2014-08-12 DIAGNOSIS — C50411 Malignant neoplasm of upper-outer quadrant of right female breast: Secondary | ICD-10-CM

## 2014-08-12 DIAGNOSIS — C773 Secondary and unspecified malignant neoplasm of axilla and upper limb lymph nodes: Secondary | ICD-10-CM

## 2014-08-12 DIAGNOSIS — Z5111 Encounter for antineoplastic chemotherapy: Secondary | ICD-10-CM

## 2014-08-12 LAB — COMPREHENSIVE METABOLIC PANEL (CC13)
ALBUMIN: 3.5 g/dL (ref 3.5–5.0)
ALT: 16 U/L (ref 0–55)
ANION GAP: 11 meq/L (ref 3–11)
AST: 12 U/L (ref 5–34)
Alkaline Phosphatase: 152 U/L — ABNORMAL HIGH (ref 40–150)
BUN: 12.4 mg/dL (ref 7.0–26.0)
CALCIUM: 8.5 mg/dL (ref 8.4–10.4)
CHLORIDE: 106 meq/L (ref 98–109)
CO2: 24 mEq/L (ref 22–29)
Creatinine: 0.9 mg/dL (ref 0.6–1.1)
EGFR: 86 mL/min/{1.73_m2} — ABNORMAL LOW (ref >90)
Glucose: 410 mg/dl — ABNORMAL HIGH (ref 70–140)
POTASSIUM: 3.8 meq/L (ref 3.5–5.1)
SODIUM: 141 meq/L (ref 136–145)
Total Bilirubin: 0.48 mg/dL (ref 0.20–1.20)
Total Protein: 6.8 g/dL (ref 6.4–8.3)

## 2014-08-12 LAB — CBC WITH DIFFERENTIAL/PLATELET
BASO%: 1.2 % (ref 0.0–2.0)
Basophils Absolute: 0.1 10*3/uL (ref 0.0–0.1)
EOS%: 0.9 % (ref 0.0–7.0)
Eosinophils Absolute: 0.1 10*3/uL (ref 0.0–0.5)
HCT: 35.9 % (ref 34.8–46.6)
HGB: 11.5 g/dL — ABNORMAL LOW (ref 11.6–15.9)
LYMPH%: 22.7 % (ref 14.0–49.7)
MCH: 30 pg (ref 25.1–34.0)
MCHC: 32.2 g/dL (ref 31.5–36.0)
MCV: 93.1 fL (ref 79.5–101.0)
MONO#: 0.8 10*3/uL (ref 0.1–0.9)
MONO%: 7.2 % (ref 0.0–14.0)
NEUT#: 7.8 10*3/uL — ABNORMAL HIGH (ref 1.5–6.5)
NEUT%: 68 % (ref 38.4–76.8)
Platelets: 250 10*3/uL (ref 145–400)
RBC: 3.85 10*6/uL (ref 3.70–5.45)
RDW: 13.8 % (ref 11.2–14.5)
WBC: 11.5 10*3/uL — ABNORMAL HIGH (ref 3.9–10.3)
lymph#: 2.6 10*3/uL (ref 0.9–3.3)

## 2014-08-12 MED ORDER — PALONOSETRON HCL INJECTION 0.25 MG/5ML
0.2500 mg | Freq: Once | INTRAVENOUS | Status: AC
Start: 2014-08-12 — End: 2014-08-12
  Administered 2014-08-12: 0.25 mg via INTRAVENOUS

## 2014-08-12 MED ORDER — DEXAMETHASONE SODIUM PHOSPHATE 20 MG/5ML IJ SOLN
12.0000 mg | Freq: Once | INTRAMUSCULAR | Status: AC
  Administered 2014-08-12: 12 mg via INTRAVENOUS

## 2014-08-12 MED ORDER — DOXORUBICIN HCL CHEMO IV INJECTION 2 MG/ML
60.0000 mg/m2 | Freq: Once | INTRAVENOUS | Status: AC
  Administered 2014-08-12: 136 mg via INTRAVENOUS
  Filled 2014-08-12: qty 68

## 2014-08-12 MED ORDER — PEGFILGRASTIM 6 MG/0.6ML ~~LOC~~ PSKT
6.0000 mg | PREFILLED_SYRINGE | Freq: Once | SUBCUTANEOUS | Status: AC
  Administered 2014-08-12: 6 mg via SUBCUTANEOUS
  Filled 2014-08-12: qty 0.6

## 2014-08-12 MED ORDER — SODIUM CHLORIDE 0.9 % IV SOLN
Freq: Once | INTRAVENOUS | Status: AC
  Administered 2014-08-12: 12:00:00 via INTRAVENOUS

## 2014-08-12 MED ORDER — HEPARIN SOD (PORK) LOCK FLUSH 100 UNIT/ML IV SOLN
500.0000 [IU] | Freq: Once | INTRAVENOUS | Status: AC | PRN
  Administered 2014-08-12: 500 [IU]
  Filled 2014-08-12: qty 5

## 2014-08-12 MED ORDER — SODIUM CHLORIDE 0.9 % IJ SOLN
10.0000 mL | INTRAMUSCULAR | Status: DC | PRN
  Administered 2014-08-12: 10 mL
  Filled 2014-08-12: qty 10

## 2014-08-12 MED ORDER — SODIUM CHLORIDE 0.9 % IV SOLN
150.0000 mg | Freq: Once | INTRAVENOUS | Status: AC
  Administered 2014-08-12: 150 mg via INTRAVENOUS
  Filled 2014-08-12: qty 5

## 2014-08-12 MED ORDER — DEXAMETHASONE SODIUM PHOSPHATE 20 MG/5ML IJ SOLN
INTRAMUSCULAR | Status: AC
  Filled 2014-08-12: qty 5

## 2014-08-12 MED ORDER — PALONOSETRON HCL INJECTION 0.25 MG/5ML
INTRAVENOUS | Status: AC
  Filled 2014-08-12: qty 5

## 2014-08-12 MED ORDER — CYCLOPHOSPHAMIDE CHEMO INJECTION 1 GM
600.0000 mg/m2 | Freq: Once | INTRAMUSCULAR | Status: AC
  Administered 2014-08-12: 1360 mg via INTRAVENOUS
  Filled 2014-08-12: qty 68

## 2014-08-12 NOTE — Progress Notes (Signed)
Neulasta onpro applied to patient's abdomen; patient instructed to leave device attached until tomorrow 08/13/14 at 7:30 pm to allow complete delivery of medication. Patient verbalizes understanding of when to remove onpro device.

## 2014-08-12 NOTE — Telephone Encounter (Signed)
pt came in today for appts....no appts sched...Marland Kitchenper terri add lab on today and she will get pt chemo and send new pof. pt took shot off early and was told that chemo would be delayed to today

## 2014-08-12 NOTE — Patient Instructions (Signed)
[facility] Discharge Instructions for Patients Receiving Chemotherapy  Today you received the following chemotherapy agents Adriamycin and Cytoxan.  To help prevent nausea and vomiting after your treatment, we encourage you to take your nausea medication as prescribed.   If you develop nausea and vomiting that is not controlled by your nausea medication, call the clinic.   BELOW ARE SYMPTOMS THAT SHOULD BE REPORTED IMMEDIATELY:  *FEVER GREATER THAN 100.5 F  *CHILLS WITH OR WITHOUT FEVER  NAUSEA AND VOMITING THAT IS NOT CONTROLLED WITH YOUR NAUSEA MEDICATION  *UNUSUAL SHORTNESS OF BREATH  *UNUSUAL BRUISING OR BLEEDING  TENDERNESS IN MOUTH AND THROAT WITH OR WITHOUT PRESENCE OF ULCERS  *URINARY PROBLEMS  *BOWEL PROBLEMS  UNUSUAL RASH Items with * indicate a potential emergency and should be followed up as soon as possible.  Feel free to call the clinic you have any questions or concerns. The clinic phone number is [phone].    

## 2014-08-13 ENCOUNTER — Other Ambulatory Visit: Payer: Self-pay | Admitting: Hematology and Oncology

## 2014-08-13 ENCOUNTER — Telehealth: Payer: Self-pay | Admitting: *Deleted

## 2014-08-13 NOTE — Telephone Encounter (Signed)
Per staff message and POF I have scheduled appts. Advised scheduler of appts. JMW  

## 2014-08-14 ENCOUNTER — Other Ambulatory Visit: Payer: Self-pay | Admitting: *Deleted

## 2014-08-14 DIAGNOSIS — C50411 Malignant neoplasm of upper-outer quadrant of right female breast: Secondary | ICD-10-CM

## 2014-08-14 MED ORDER — MECLIZINE HCL 25 MG PO TABS: 25.0000 mg | ORAL_TABLET | Freq: Two times a day (BID) | ORAL | Status: DC

## 2014-08-15 ENCOUNTER — Other Ambulatory Visit: Payer: Self-pay | Admitting: Emergency Medicine

## 2014-08-15 DIAGNOSIS — C50411 Malignant neoplasm of upper-outer quadrant of right female breast: Secondary | ICD-10-CM

## 2014-08-15 MED ORDER — LORAZEPAM 0.5 MG PO TABS: 0.5000 mg | ORAL_TABLET | Freq: Four times a day (QID) | ORAL | Status: DC | PRN

## 2014-08-18 ENCOUNTER — Telehealth: Payer: Self-pay | Admitting: Nurse Practitioner

## 2014-08-18 ENCOUNTER — Ambulatory Visit: Payer: No Typology Code available for payment source | Admitting: Physician Assistant

## 2014-08-18 ENCOUNTER — Other Ambulatory Visit: Payer: No Typology Code available for payment source

## 2014-08-18 NOTE — Telephone Encounter (Signed)
, °

## 2014-08-21 ENCOUNTER — Other Ambulatory Visit: Payer: No Typology Code available for payment source

## 2014-08-21 ENCOUNTER — Other Ambulatory Visit (HOSPITAL_BASED_OUTPATIENT_CLINIC_OR_DEPARTMENT_OTHER): Payer: Medicaid Other

## 2014-08-21 ENCOUNTER — Ambulatory Visit: Payer: No Typology Code available for payment source

## 2014-08-21 ENCOUNTER — Ambulatory Visit (HOSPITAL_BASED_OUTPATIENT_CLINIC_OR_DEPARTMENT_OTHER): Payer: Medicaid Other | Admitting: Nurse Practitioner

## 2014-08-21 VITALS — BP 145/61 | HR 98 | Temp 97.6°F | Resp 18 | Ht 65.0 in | Wt 239.8 lb

## 2014-08-21 DIAGNOSIS — Z170 Estrogen receptor positive status [ER+]: Secondary | ICD-10-CM

## 2014-08-21 DIAGNOSIS — C50411 Malignant neoplasm of upper-outer quadrant of right female breast: Secondary | ICD-10-CM

## 2014-08-21 LAB — CBC WITH DIFFERENTIAL/PLATELET
BASO%: 2.3 % — ABNORMAL HIGH (ref 0.0–2.0)
Basophils Absolute: 0.1 10*3/uL (ref 0.0–0.1)
EOS%: 1 % (ref 0.0–7.0)
Eosinophils Absolute: 0 10*3/uL (ref 0.0–0.5)
HCT: 33.9 % — ABNORMAL LOW (ref 34.8–46.6)
HGB: 11.4 g/dL — ABNORMAL LOW (ref 11.6–15.9)
LYMPH%: 54.3 % — ABNORMAL HIGH (ref 14.0–49.7)
MCH: 30.8 pg (ref 25.1–34.0)
MCHC: 33.6 g/dL (ref 31.5–36.0)
MCV: 91.6 fL (ref 79.5–101.0)
MONO#: 0.3 10*3/uL (ref 0.1–0.9)
MONO%: 10.3 % (ref 0.0–14.0)
NEUT#: 1 10*3/uL — ABNORMAL LOW (ref 1.5–6.5)
NEUT%: 32.1 % — AB (ref 38.4–76.8)
Platelets: 68 10*3/uL — ABNORMAL LOW (ref 145–400)
RBC: 3.7 10*6/uL (ref 3.70–5.45)
RDW: 14.1 % (ref 11.2–14.5)
WBC: 3.1 10*3/uL — AB (ref 3.9–10.3)
lymph#: 1.7 10*3/uL (ref 0.9–3.3)
nRBC: 0 % (ref 0–0)

## 2014-08-21 LAB — COMPREHENSIVE METABOLIC PANEL (CC13)
ALK PHOS: 181 U/L — AB (ref 40–150)
ALT: 23 U/L (ref 0–55)
AST: 7 U/L (ref 5–34)
Albumin: 3.4 g/dL — ABNORMAL LOW (ref 3.5–5.0)
Anion Gap: 11 mEq/L (ref 3–11)
BUN: 12.4 mg/dL (ref 7.0–26.0)
CHLORIDE: 102 meq/L (ref 98–109)
CO2: 26 mEq/L (ref 22–29)
Calcium: 8.8 mg/dL (ref 8.4–10.4)
Creatinine: 1.2 mg/dL — ABNORMAL HIGH (ref 0.6–1.1)
EGFR: 56 mL/min/{1.73_m2} — ABNORMAL LOW (ref >90)
GLUCOSE: 541 mg/dL — AB (ref 70–140)
Potassium: 4.1 mEq/L (ref 3.5–5.1)
SODIUM: 138 meq/L (ref 136–145)
TOTAL PROTEIN: 6.7 g/dL (ref 6.4–8.3)
Total Bilirubin: 0.48 mg/dL (ref 0.20–1.20)

## 2014-08-21 NOTE — Progress Notes (Signed)
Pt requested to speak with CSW, called and LM with Abby Potash. Pt was unspecific into what concerns she had, just stated it was in regards to a financial situation that she has already discussed with Abby Potash.

## 2014-08-21 NOTE — Progress Notes (Signed)
  [place] OFFICE PROGRESS NOTE   Diagnosis:  Breast cancer  INTERVAL HISTORY:   Marissa Haney returns as scheduled. She completed cycle 3 dose dense AC on 08/12/2014. Aside from intermittently feeling "weak" she feels she is tolerating the chemotherapy well. She has had some mild nausea. No vomiting. No mouth sores. No skin rash. No diarrhea.  Objective:  Vital signs in last 24 hours:  Blood pressure 145/61, pulse 98, temperature 97.6 F (36.4 C), temperature source Oral, resp. rate 18, height $RemoveBe'5\' 5"'[id number]$  (1.651 m), weight 239 lb 12.8 oz (108.773 kg), SpO2 100 %.    HEENT: No thrush or ulcers. Lymphatics: No palpable cervical or supra clavicular lymph nodes. Resp: Lungs clear bilaterally. Cardio: Regular rate and rhythm. GI: Abdomen soft and nontender. No hepatomegaly. Vascular: No leg edema. Calves nontender.  Skin: No rash. Port-A-Cath without erythema.    Lab Results:  Lab Results  Component Value Date   WBC 3.1* 08/21/2014   HGB 11.4* 08/21/2014   HCT 33.9* 08/21/2014   MCV 91.6 08/21/2014   PLT 68* 08/21/2014   NEUTROABS 1.0* 08/21/2014    Imaging:  No results found.  Medications: I have reviewed the patient's current medications.  Assessment/Plan: 1. Breast cancer; right breast invasive ductal carcinoma 3.6 cm by MRI with enlarged right axillary lymph node biopsy proven to be positive for cancer , T2 N1 M0 stage IIB ER positive PR positive HER-2 negative Ki-67 22%. Dose dense AC initiated 07/10/2014 with 4 cycles planned to be followed by Taxol weekly 12.   Disposition: Marissa Haney appears stable. She has completed 3 cycles of AC. Overall she appears to be tolerating the chemotherapy well. We reviewed today's labs which showed neutropenia and thrombocytopenia. Precautions were reviewed. She understands to contact the office with fever, chills, other signs of infection, spontaneous bruising, bleeding.  She is scheduled to return for a follow-up  visit and cycle 4 AC on 08/26/2014.    Ned Card ANP/GNP-BC   08/21/2014  4:35 PM

## 2014-08-22 ENCOUNTER — Encounter: Payer: Self-pay | Admitting: *Deleted

## 2014-08-22 NOTE — Progress Notes (Signed)
[place] Work  Clinical Social Work was referred by patient for assessment of psychosocial needs due to financial concerns.  Clinical Social Worker met with patient at [facility] on 08/21/13 after her appointment to offer support and assess for needs.  CSW reviewed [employer] application and pt not wanting to complete today. CSW provided pt with list of needed documents in order to complete application and she plans to bring next week. Pt showed CSW letter from [place] that she was approved and should receive assistance from them next week. Pt met with [employer] today and received gas card. Pt has applied for mortgage assistance and this is pending. She is hopeful she can locate funds to help with her mortgage.  Pt in good spirits today as it is her birthday. CSW to follow and meet again next week to complete [employer] application.   Clinical Social Work interventions: Resource assistance  Loren Racer, [place] Worker [place]  [place] Phone: [phone] Fax: [phone]

## 2014-08-25 ENCOUNTER — Other Ambulatory Visit: Payer: Self-pay | Admitting: *Deleted

## 2014-08-25 DIAGNOSIS — C50411 Malignant neoplasm of upper-outer quadrant of right female breast: Secondary | ICD-10-CM

## 2014-08-26 ENCOUNTER — Encounter: Payer: Self-pay | Admitting: Nurse Practitioner

## 2014-08-26 ENCOUNTER — Telehealth: Payer: Self-pay | Admitting: *Deleted

## 2014-08-26 ENCOUNTER — Other Ambulatory Visit (HOSPITAL_BASED_OUTPATIENT_CLINIC_OR_DEPARTMENT_OTHER): Payer: Medicaid Other

## 2014-08-26 ENCOUNTER — Ambulatory Visit (HOSPITAL_BASED_OUTPATIENT_CLINIC_OR_DEPARTMENT_OTHER): Payer: Medicaid Other

## 2014-08-26 ENCOUNTER — Telehealth: Payer: Self-pay | Admitting: Nurse Practitioner

## 2014-08-26 ENCOUNTER — Other Ambulatory Visit: Payer: Self-pay | Admitting: *Deleted

## 2014-08-26 ENCOUNTER — Ambulatory Visit (HOSPITAL_BASED_OUTPATIENT_CLINIC_OR_DEPARTMENT_OTHER): Payer: Medicaid Other | Admitting: Nurse Practitioner

## 2014-08-26 VITALS — BP 162/61 | HR 64 | Temp 98.5°F | Resp 18 | Ht 65.0 in | Wt 243.0 lb

## 2014-08-26 DIAGNOSIS — G620 Drug-induced polyneuropathy: Secondary | ICD-10-CM

## 2014-08-26 DIAGNOSIS — C773 Secondary and unspecified malignant neoplasm of axilla and upper limb lymph nodes: Secondary | ICD-10-CM

## 2014-08-26 DIAGNOSIS — T451X5A Adverse effect of antineoplastic and immunosuppressive drugs, initial encounter: Secondary | ICD-10-CM

## 2014-08-26 DIAGNOSIS — Z5111 Encounter for antineoplastic chemotherapy: Secondary | ICD-10-CM

## 2014-08-26 DIAGNOSIS — C50411 Malignant neoplasm of upper-outer quadrant of right female breast: Secondary | ICD-10-CM

## 2014-08-26 DIAGNOSIS — Z5189 Encounter for other specified aftercare: Secondary | ICD-10-CM

## 2014-08-26 DIAGNOSIS — Z170 Estrogen receptor positive status [ER+]: Secondary | ICD-10-CM

## 2014-08-26 LAB — COMPREHENSIVE METABOLIC PANEL (CC13)
ALT: 20 U/L (ref 0–55)
AST: 12 U/L (ref 5–34)
Albumin: 3.1 g/dL — ABNORMAL LOW (ref 3.5–5.0)
Alkaline Phosphatase: 161 U/L — ABNORMAL HIGH (ref 40–150)
Anion Gap: 8 mEq/L (ref 3–11)
BUN: 10.2 mg/dL (ref 7.0–26.0)
CALCIUM: 8.3 mg/dL — AB (ref 8.4–10.4)
CO2: 28 meq/L (ref 22–29)
CREATININE: 1 mg/dL (ref 0.6–1.1)
Chloride: 102 mEq/L (ref 98–109)
EGFR: 69 mL/min/{1.73_m2} — ABNORMAL LOW (ref >90)
Glucose: 466 mg/dl — ABNORMAL HIGH (ref 70–140)
Potassium: 4.1 mEq/L (ref 3.5–5.1)
Sodium: 139 mEq/L (ref 136–145)
TOTAL PROTEIN: 6.2 g/dL — AB (ref 6.4–8.3)
Total Bilirubin: 0.39 mg/dL (ref 0.20–1.20)

## 2014-08-26 LAB — CBC WITH DIFFERENTIAL/PLATELET
BASO%: 0.5 % (ref 0.0–2.0)
Basophils Absolute: 0 10*3/uL (ref 0.0–0.1)
EOS ABS: 0.1 10*3/uL (ref 0.0–0.5)
EOS%: 1.6 % (ref 0.0–7.0)
HEMATOCRIT: 33.1 % — AB (ref 34.8–46.6)
HEMOGLOBIN: 10.8 g/dL — AB (ref 11.6–15.9)
LYMPH#: 2.5 10*3/uL (ref 0.9–3.3)
LYMPH%: 30.5 % (ref 14.0–49.7)
MCH: 30.6 pg (ref 25.1–34.0)
MCHC: 32.6 g/dL (ref 31.5–36.0)
MCV: 93.8 fL (ref 79.5–101.0)
MONO#: 0.9 10*3/uL (ref 0.1–0.9)
MONO%: 11.1 % (ref 0.0–14.0)
NEUT#: 4.7 10*3/uL (ref 1.5–6.5)
NEUT%: 56.3 % (ref 38.4–76.8)
Platelets: 148 10*3/uL (ref 145–400)
RBC: 3.53 10*6/uL — ABNORMAL LOW (ref 3.70–5.45)
RDW: 15.2 % — ABNORMAL HIGH (ref 11.2–14.5)
WBC: 8.3 10*3/uL (ref 3.9–10.3)

## 2014-08-26 MED ORDER — PALONOSETRON HCL INJECTION 0.25 MG/5ML
0.2500 mg | Freq: Once | INTRAVENOUS | Status: AC
  Administered 2014-08-26: 0.25 mg via INTRAVENOUS

## 2014-08-26 MED ORDER — SODIUM CHLORIDE 0.9 % IV SOLN
600.0000 mg/m2 | Freq: Once | INTRAVENOUS | Status: AC
  Administered 2014-08-26: 1360 mg via INTRAVENOUS
  Filled 2014-08-26: qty 68

## 2014-08-26 MED ORDER — PALONOSETRON HCL INJECTION 0.25 MG/5ML
INTRAVENOUS | Status: AC
  Filled 2014-08-26: qty 5

## 2014-08-26 MED ORDER — SODIUM CHLORIDE 0.9 % IJ SOLN
10.0000 mL | INTRAMUSCULAR | Status: DC | PRN
  Administered 2014-08-26: 10 mL
  Filled 2014-08-26: qty 10

## 2014-08-26 MED ORDER — DEXAMETHASONE SODIUM PHOSPHATE 20 MG/5ML IJ SOLN
12.0000 mg | Freq: Once | INTRAMUSCULAR | Status: AC
  Administered 2014-08-26: 12 mg via INTRAVENOUS

## 2014-08-26 MED ORDER — LORAZEPAM 0.5 MG PO TABS: 0.5000 mg | ORAL_TABLET | Freq: Four times a day (QID) | ORAL | Status: DC | PRN

## 2014-08-26 MED ORDER — DEXAMETHASONE SODIUM PHOSPHATE 20 MG/5ML IJ SOLN
INTRAMUSCULAR | Status: AC
  Filled 2014-08-26: qty 5

## 2014-08-26 MED ORDER — PEGFILGRASTIM 6 MG/0.6ML ~~LOC~~ PSKT
6.0000 mg | PREFILLED_SYRINGE | Freq: Once | SUBCUTANEOUS | Status: AC
  Administered 2014-08-26: 6 mg via SUBCUTANEOUS
  Filled 2014-08-26: qty 0.6

## 2014-08-26 MED ORDER — DOXORUBICIN HCL CHEMO IV INJECTION 2 MG/ML
60.0000 mg/m2 | Freq: Once | INTRAVENOUS | Status: AC
  Administered 2014-08-26: 136 mg via INTRAVENOUS
  Filled 2014-08-26: qty 68

## 2014-08-26 MED ORDER — HEPARIN SOD (PORK) LOCK FLUSH 100 UNIT/ML IV SOLN
500.0000 [IU] | Freq: Once | INTRAVENOUS | Status: AC | PRN
  Administered 2014-08-26: 500 [IU]
  Filled 2014-08-26: qty 5

## 2014-08-26 MED ORDER — SODIUM CHLORIDE 0.9 % IV SOLN
150.0000 mg | Freq: Once | INTRAVENOUS | Status: AC
  Administered 2014-08-26: 150 mg via INTRAVENOUS
  Filled 2014-08-26: qty 5

## 2014-08-26 MED ORDER — SODIUM CHLORIDE 0.9 % IV SOLN
Freq: Once | INTRAVENOUS | Status: AC
  Administered 2014-08-26: 10:00:00 via INTRAVENOUS

## 2014-08-26 NOTE — Telephone Encounter (Signed)
per pof to sch pt appt-gave pt copy of sch-sent MW emailt o sch trmt-pt aware of appt °

## 2014-08-26 NOTE — Patient Instructions (Signed)
Cancer Center Discharge Instructions for Patients Receiving Chemotherapy  Today you received the following chemotherapy agents Adriamycin and Cytoxan.  To help prevent nausea and vomiting after your treatment, we encourage you to take your nausea medication as prescribed.   If you develop nausea and vomiting that is not controlled by your nausea medication, call the clinic.   BELOW ARE SYMPTOMS THAT SHOULD BE REPORTED IMMEDIATELY:  *FEVER GREATER THAN 100.5 F  *CHILLS WITH OR WITHOUT FEVER  NAUSEA AND VOMITING THAT IS NOT CONTROLLED WITH YOUR NAUSEA MEDICATION  *UNUSUAL SHORTNESS OF BREATH  *UNUSUAL BRUISING OR BLEEDING  TENDERNESS IN MOUTH AND THROAT WITH OR WITHOUT PRESENCE OF ULCERS  *URINARY PROBLEMS  *BOWEL PROBLEMS  UNUSUAL RASH Items with * indicate a potential emergency and should be followed up as soon as possible.  Feel free to call the clinic you have any questions or concerns. The clinic phone number is [phone].    

## 2014-08-26 NOTE — Assessment & Plan Note (Addendum)
Right breast invasive ductal carcinoma 3.6 cm by MRI with enlarged right axillary lymph node biopsy proven to be positive for cancer , T2 N1 M0 stage IIB ER positive PR positive HER-2 negative Ki-67 22%  Current treatment: Dose dense Adriamycin and Cytoxan every 2 weeks 4 followed by Taxol weekly 12; Today is cycle 4 day 1  Marissa Haney is doing well today. The labs were reviewed in detail and were entirely stable. I consulted with Dr. Lindi Adie regarding her neuropathy symptoms. At this time they are light and have not spread. He suggested some physical therapy to increase her grip strength as her "clumsiness" could be related to muscle fatigue and weakness, but she declined. She will proceed with there 4th and final cycle of adriamycin and cytoxan as planned today. Marissa Haney is also using the neulasta onbody injector as well.   Marissa Haney will return in 2 weeks for labs, a follow up visit, and the start of 12 weeks of taxol.

## 2014-08-26 NOTE — Telephone Encounter (Signed)
Per staff message and POF I have scheduled appts. Advised scheduler of appts. JMW  

## 2014-08-26 NOTE — Progress Notes (Signed)
1132 Had just finished giving Adriamycin push through free flowing NS and noticed a slight orange/red tint under clear, occlusive dressing. Noted a minute amount of orange/red tinted fluid under dressing after closer assessment. Chemotherapy spill kit obtained and protective equipment donned per policy by this nurse and Hollace Kinnier. Selena Lesser NP notified and per her instructions, Port needle removed, area cleaned with warm soapy water. Pt denies any burning or pain in area, no skin discoloration noted in Port-a-cath area. Per Dr.Gudena, reaccess Port and proceed with treatment as scheduled. Pt reaccessed, Port flushed easily with blood return.

## 2014-08-26 NOTE — Progress Notes (Signed)
Patient Care Team: Cyndi Bender, PA-C as PCP - General (Physician Assistant)  DIAGNOSIS: No matching staging information was found for the patient.  SUMMARY OF ONCOLOGIC HISTORY:   Breast cancer of upper-outer quadrant of right female breast   05/27/2014 Mammogram Right breast mass by mammogram 5.4 cm by ultrasound 4 x 4 X 2 cm   05/28/2014 Initial Biopsy Right breast biopsy 11:30: Invasive ductal carcinoma with lymphovascular invasion grade 1/2, ER 100%, PR 97%, Ki-67 22%, HER-2 negative ratio 1.26 average copy #1.7, lymph node biopsy also positive   06/16/2014 Breast MRI right breast cancer: 3.6 cm at 12:00 position mildly enlarged right axillary lymph node: 5 mm oval enhancing mass in the central left breast could be an intramammary lymph node   07/10/2014 -  Neo-Adjuvant Chemotherapy Dose dense Adriamycin and Cytoxan 4 followed by Taxol 12   07/10/2014 Procedure PREVENT clinical trial (Lipitor vs Placebo) in patients undergoing cardiotoxic chemotherapy    CHIEF COMPLIANT: cycle 4 day 1 dose dense Adriamycin and Cytoxan  INTERVAL HISTORY: Marissa Haney is a 59 year old lady with above-mentioned history of right breast cancer currently on neoadjuvant chemotherapy with dose dense Adriamycin and Cytoxan. With every successive cycle, she becomes more fatigued, but overall she is doing well. She states that the tumor "is nothing compared to when we first started. She denies nausea or vomiting, and is moving her bowels well. Her appetite is fair. She denies mouth sores or rashes. She has some neuropathy symptoms to the tips of her fingers and toes. She dropped a bowl and a cup earlier this week, but believes she is just clumsy.  REVIEW OF SYSTEMS:   Constitutional: Denies fevers, chills or abnormal weight loss Eyes: Denies blurriness of vision Ears, nose, mouth, throat, and face: Denies mucositis or sore throat Respiratory: Denies cough, dyspnea or wheezes Cardiovascular: Denies palpitation,  chest discomfort or lower extremity swelling Gastrointestinal:  Denies nausea, heartburn or change in bowel habits Skin: Denies abnormal skin rashes Lymphatics: Denies new lymphadenopathy or easy bruising Neurological: mild numbness/tingling to fingertips/toes Behavioral/Psych: Mood is stable, no new changes  All other systems were reviewed with the patient and are negative.  I have reviewed the past medical history, past surgical history, social history and family history with the patient and they are unchanged from previous note.  ALLERGIES:  has No Known Allergies.  MEDICATIONS:  Current Outpatient Prescriptions  Medication Sig Dispense Refill  . ALPRAZolam (XANAX) 0.25 MG tablet Take 1 tablet (0.25 mg total) by mouth 2 (two) times daily as needed for anxiety. 30 tablet 0  . dexamethasone (DECADRON) 4 MG tablet Take 2 tablets by mouth once a day on the day after chemotherapy and then take 2 tablets two times a day for 2 days. Take with food. 30 tablet 1  . glipiZIDE (GLUCOTROL) 5 MG tablet Take 5 mg by mouth 2 (two) times daily before a meal.    . lidocaine-prilocaine (EMLA) cream Apply to affected area once 30 g 3  . lisinopril (PRINIVIL,ZESTRIL) 10 MG tablet Take 10 mg by mouth daily.    . meclizine (ANTIVERT) 25 MG tablet Take 1 tablet (25 mg total) by mouth 2 (two) times daily. 60 tablet 3  . metFORMIN (GLUCOPHAGE) 500 MG tablet Take 500 mg by mouth.    . metoprolol (LOPRESSOR) 50 MG tablet Take 1 tablet (50 mg total) by mouth 2 (two) times daily. 60 tablet 1  . ondansetron (ZOFRAN) 8 MG tablet Take 1 tablet (8 mg total) by mouth  2 (two) times daily as needed. Start on the third day after chemotherapy. 30 tablet 1  . prochlorperazine (COMPAZINE) 10 MG tablet Take 1 tablet (10 mg total) by mouth every 6 (six) hours as needed (Nausea or vomiting). 30 tablet 1  . HYDROcodone-acetaminophen (NORCO) 5-325 MG per tablet Take 1-2 tablets by mouth every 6 (six) hours as needed for moderate  pain or severe pain. (Patient not taking: Reported on 08/26/2014) 30 tablet 0  . LORazepam (ATIVAN) 0.5 MG tablet Take 1 tablet (0.5 mg total) by mouth every 6 (six) hours as needed (Nausea or vomiting). 30 tablet 0  . oxyCODONE-acetaminophen (PERCOCET/ROXICET) 5-325 MG per tablet Take by mouth.     No current facility-administered medications for this visit.    PHYSICAL EXAMINATION: ECOG PERFORMANCE STATUS: 0 - Asymptomatic  Filed Vitals:   08/26/14 0900  BP: 162/61  Pulse: 64  Temp: 98.5 F (36.9 C)  Resp: 18   Filed Weights   08/26/14 0900  Weight: 243 lb (110.224 kg)   Skin: warm, dry  HEENT: sclerae anicteric, conjunctivae pink, oropharynx clear. No thrush or mucositis.  Lymph Nodes: No cervical or supraclavicular lymphadenopathy  Lungs: clear to auscultation bilaterally, no rales, wheezes, or rhonci  Heart: regular rate and rhythm  Abdomen: round, soft, non tender, positive bowel sounds  Musculoskeletal: No focal spinal tenderness, no peripheral edema  Neuro: non focal, well oriented, positive affect  Breasts: deferred  LABORATORY DATA:  I have reviewed the data as listed   Chemistry      Component Value Date/Time   NA 139 08/26/2014 0848   NA 143 06/18/2014 1444   K 4.1 08/26/2014 0848   K 3.6* 06/18/2014 1444   CL 105 06/18/2014 1444   CO2 28 08/26/2014 0848   CO2 26 06/18/2014 1444   BUN 10.2 08/26/2014 0848   BUN 11 06/18/2014 1444   CREATININE 1.0 08/26/2014 0848   CREATININE 0.67 06/18/2014 1444      Component Value Date/Time   CALCIUM 8.3* 08/26/2014 0848   CALCIUM 9.0 06/18/2014 1444   ALKPHOS 161* 08/26/2014 0848   ALKPHOS 138* 06/18/2014 1444   AST 12 08/26/2014 0848   AST 15 06/18/2014 1444   ALT 20 08/26/2014 0848   ALT 22 06/18/2014 1444   BILITOT 0.39 08/26/2014 0848   BILITOT 0.5 06/18/2014 1444       Lab Results  Component Value Date   WBC 8.3 08/26/2014   HGB 10.8* 08/26/2014   HCT 33.1* 08/26/2014   MCV 93.8 08/26/2014   PLT  148 08/26/2014   NEUTROABS 4.7 08/26/2014   ASSESSMENT & PLAN:  Breast cancer of upper-outer quadrant of right female breast Right breast invasive ductal carcinoma 3.6 cm by MRI with enlarged right axillary lymph node biopsy proven to be positive for cancer , T2 N1 M0 stage IIB ER positive PR positive HER-2 negative Ki-67 22%  Current treatment: Dose dense Adriamycin and Cytoxan every 2 weeks 4 followed by Taxol weekly 12; Today is cycle 4 day 1  Lindyn is doing well today. The labs were reviewed in detail and were entirely stable. I consulted with Dr. Lindi Adie regarding her neuropathy symptoms. At this time they are light and have not spread. He suggested some physical therapy to increase her grip strength as her "clumsiness" could be related to muscle fatigue and weakness, but she declined. She will proceed with there 4th and final cycle of adriamycin and cytoxan as planned today. Daisi is also using the neulasta onbody  injector as well.   Soriya will return in 2 weeks for labs, a follow up visit, and the start of 12 weeks of taxol.   Orders Placed This Encounter  Procedures  . CBC with Differential    Standing Status: Future     Number of Occurrences:      Standing Expiration Date: 08/26/2015  . Comprehensive metabolic panel    Standing Status: Future     Number of Occurrences:      Standing Expiration Date: 08/26/2015   The patient has a good understanding of the overall plan. she agrees with it. She will call with any problems that may develop before her next visit here.   Marcelino Duster, NP

## 2014-09-05 ENCOUNTER — Encounter: Payer: Self-pay | Admitting: *Deleted

## 2014-09-05 NOTE — Progress Notes (Signed)
Marissa Haney  Clinical Social Haney phoned pt to check in as pt had requested assistance with financial concerns, but then had not returned. Pt reported she had not been feeling well, but planned to come this upcoming Tuesday. CSW can still assist her with applications as needed, but pt needs to bring in required info in order to do so. Pt aware and will follow up next week. CSW to continue to follow and assist.   Clinical Social Haney interventions: Resource assistance Supportive listening  Marissa Haney, [place]  [place] Phone: [phone] Fax: [phone]

## 2014-09-09 ENCOUNTER — Other Ambulatory Visit: Payer: Self-pay

## 2014-09-09 ENCOUNTER — Ambulatory Visit (HOSPITAL_BASED_OUTPATIENT_CLINIC_OR_DEPARTMENT_OTHER): Payer: Medicaid Other | Admitting: Hematology and Oncology

## 2014-09-09 ENCOUNTER — Telehealth: Payer: Self-pay | Admitting: Hematology and Oncology

## 2014-09-09 ENCOUNTER — Ambulatory Visit: Payer: Medicaid Other

## 2014-09-09 ENCOUNTER — Telehealth: Payer: Self-pay | Admitting: *Deleted

## 2014-09-09 ENCOUNTER — Other Ambulatory Visit (HOSPITAL_BASED_OUTPATIENT_CLINIC_OR_DEPARTMENT_OTHER): Payer: Medicaid Other

## 2014-09-09 VITALS — BP 197/61 | HR 73 | Temp 97.8°F | Resp 18 | Ht 65.0 in | Wt 242.8 lb

## 2014-09-09 DIAGNOSIS — C50411 Malignant neoplasm of upper-outer quadrant of right female breast: Secondary | ICD-10-CM

## 2014-09-09 DIAGNOSIS — C773 Secondary and unspecified malignant neoplasm of axilla and upper limb lymph nodes: Secondary | ICD-10-CM

## 2014-09-09 DIAGNOSIS — R5383 Other fatigue: Secondary | ICD-10-CM

## 2014-09-09 DIAGNOSIS — I10 Essential (primary) hypertension: Secondary | ICD-10-CM

## 2014-09-09 DIAGNOSIS — G620 Drug-induced polyneuropathy: Secondary | ICD-10-CM

## 2014-09-09 LAB — CBC WITH DIFFERENTIAL/PLATELET
BASO%: 0.6 % (ref 0.0–2.0)
Basophils Absolute: 0 10*3/uL (ref 0.0–0.1)
EOS ABS: 0 10*3/uL (ref 0.0–0.5)
EOS%: 0.5 % (ref 0.0–7.0)
HEMATOCRIT: 32.4 % — AB (ref 34.8–46.6)
HEMOGLOBIN: 10.7 g/dL — AB (ref 11.6–15.9)
LYMPH#: 2.3 10*3/uL (ref 0.9–3.3)
LYMPH%: 34.2 % (ref 14.0–49.7)
MCH: 31.9 pg (ref 25.1–34.0)
MCHC: 33 g/dL (ref 31.5–36.0)
MCV: 96.7 fL (ref 79.5–101.0)
MONO#: 0.8 10*3/uL (ref 0.1–0.9)
MONO%: 11.3 % (ref 0.0–14.0)
NEUT%: 53.4 % (ref 38.4–76.8)
NEUTROS ABS: 3.6 10*3/uL (ref 1.5–6.5)
Platelets: 184 10*3/uL (ref 145–400)
RBC: 3.35 10*6/uL — ABNORMAL LOW (ref 3.70–5.45)
RDW: 16.7 % — AB (ref 11.2–14.5)
WBC: 6.7 10*3/uL (ref 3.9–10.3)

## 2014-09-09 LAB — COMPREHENSIVE METABOLIC PANEL (CC13)
ALT: 16 U/L (ref 0–55)
AST: 12 U/L (ref 5–34)
Albumin: 3 g/dL — ABNORMAL LOW (ref 3.5–5.0)
Alkaline Phosphatase: 159 U/L — ABNORMAL HIGH (ref 40–150)
Anion Gap: 10 mEq/L (ref 3–11)
BUN: 10.4 mg/dL (ref 7.0–26.0)
CHLORIDE: 105 meq/L (ref 98–109)
CO2: 27 meq/L (ref 22–29)
Calcium: 8.4 mg/dL (ref 8.4–10.4)
Creatinine: 0.9 mg/dL (ref 0.6–1.1)
EGFR: 78 mL/min/{1.73_m2} — ABNORMAL LOW (ref >90)
Glucose: 426 mg/dl — ABNORMAL HIGH (ref 70–140)
Potassium: 3.8 mEq/L (ref 3.5–5.1)
Sodium: 142 mEq/L (ref 136–145)
TOTAL PROTEIN: 6.3 g/dL — AB (ref 6.4–8.3)
Total Bilirubin: 0.44 mg/dL (ref 0.20–1.20)

## 2014-09-09 MED ORDER — LISINOPRIL 10 MG PO TABS: 10.0000 mg | ORAL_TABLET | Freq: Every day | ORAL | Status: DC

## 2014-09-09 NOTE — Assessment & Plan Note (Signed)
Right breast invasive ductal carcinoma 3.6 cm by MRI with enlarged right axillary lymph node biopsy proven to be positive for cancer , T2 N1 M0 stage IIB ER positive PR positive HER-2 negative Ki-67 22%  Current treatment:Dose dense Adriamycin and Cytoxan every 2 weeks 4 followed by Taxol weekly 12; Completed 4 cycles of AC; Today is week 1/12 of taxol  Toxicities to chemotherapy: 1. Tiredness 2. Muscle aches and pains 3. Neuropathy vs Muscle weakness  Monitoring closely for chemotherapy toxicities Blood work was reviewed and it is adequate for treatment Return to clinic in 2 weeks and weekly for Taxol

## 2014-09-09 NOTE — Progress Notes (Signed)
Patient Care Team: Cyndi Bender, PA-C as PCP - General (Physician Assistant)  DIAGNOSIS: No matching staging information was found for the patient.  SUMMARY OF ONCOLOGIC HISTORY:   Breast cancer of upper-outer quadrant of right female breast   05/27/2014 Mammogram Right breast mass by mammogram 5.4 cm by ultrasound 4 x 4 X 2 cm   05/28/2014 Initial Biopsy Right breast biopsy 11:30: Invasive ductal carcinoma with lymphovascular invasion grade 1/2, ER 100%, PR 97%, Ki-67 22%, HER-2 negative ratio 1.26 average copy #1.7, lymph node biopsy also positive   06/16/2014 Breast MRI right breast cancer: 3.6 cm at 12:00 position mildly enlarged right axillary lymph node: 5 mm oval enhancing mass in the central left breast could be an intramammary lymph node   07/10/2014 -  Neo-Adjuvant Chemotherapy Dose dense Adriamycin and Cytoxan 4 followed by Taxol 12   07/10/2014 Procedure PREVENT clinical trial (Lipitor vs Placebo) in patients undergoing cardiotoxic chemotherapy    CHIEF COMPLIANT: Neuropathy in fingers and toes  INTERVAL HISTORY: Marissa Haney is a 59 year old lady with above-mentioned history of right breast cancer currently on neoadjuvant chemotherapy had completed 4 cycles of Adriamycin and Cytoxan. After fourth cycle she developed tingling and numbness of her hands and feet. She also felt extremely weak and tired lack of taste and appetite. Denied any nausea or vomiting. Her blood pressure severely elevated today.  REVIEW OF SYSTEMS:   Constitutional: Denies fevers, chills or abnormal weight loss Eyes: Denies blurriness of vision Ears, nose, mouth, throat, and face: Denies mucositis or sore throat Respiratory: Denies cough, dyspnea or wheezes Cardiovascular: Denies palpitation, chest discomfort or lower extremity swelling Gastrointestinal:  Denies nausea, heartburn or change in bowel habits Skin: Denies abnormal skin rashes Lymphatics: Denies new lymphadenopathy or easy  bruising Neurological:Denies numbness, tingling or new weaknesses Behavioral/Psych: Mood is stable, no new changes  Breast: No breast lumps a markedly become smaller. All other systems were reviewed with the patient and are negative.  I have reviewed the past medical history, past surgical history, social history and family history with the patient and they are unchanged from previous note.  ALLERGIES:  has No Known Allergies.  MEDICATIONS:  Current Outpatient Prescriptions  Medication Sig Dispense Refill  . ALPRAZolam (XANAX) 0.25 MG tablet Take 1 tablet (0.25 mg total) by mouth 2 (two) times daily as needed for anxiety. 30 tablet 0  . glipiZIDE (GLUCOTROL) 5 MG tablet Take 5 mg by mouth 2 (two) times daily before a meal.    . HYDROcodone-acetaminophen (NORCO) 5-325 MG per tablet Take 1-2 tablets by mouth every 6 (six) hours as needed for moderate pain or severe pain. 30 tablet 0  . lisinopril (PRINIVIL,ZESTRIL) 10 MG tablet Take 10 mg by mouth daily.    Marland Kitchen LORazepam (ATIVAN) 0.5 MG tablet Take 1 tablet (0.5 mg total) by mouth every 6 (six) hours as needed (Nausea or vomiting). 30 tablet 0  . meclizine (ANTIVERT) 25 MG tablet Take 1 tablet (25 mg total) by mouth 2 (two) times daily. 60 tablet 3  . metFORMIN (GLUCOPHAGE) 500 MG tablet Take 500 mg by mouth.    . metoprolol (LOPRESSOR) 50 MG tablet Take 1 tablet (50 mg total) by mouth 2 (two) times daily. 60 tablet 1  . oxyCODONE-acetaminophen (PERCOCET/ROXICET) 5-325 MG per tablet Take by mouth.     No current facility-administered medications for this visit.    PHYSICAL EXAMINATION: ECOG PERFORMANCE STATUS: 1 - Symptomatic but completely ambulatory  Filed Vitals:   09/09/14 0822  BP:  197/61  Pulse: 73  Temp: 97.8 F (36.6 C)  Resp: 18   Filed Weights   09/09/14 0822  Weight: 242 lb 12.8 oz (110.133 kg)    GENERAL:alert, no distress and comfortable SKIN: skin color, texture, turgor are normal, no rashes or significant  lesions EYES: normal, Conjunctiva are pink and non-injected, sclera clear OROPHARYNX:no exudate, no erythema and lips, buccal mucosa, and tongue normal  NECK: supple, thyroid normal size, non-tender, without nodularity LYMPH:  no palpable lymphadenopathy in the cervical, axillary or inguinal LUNGS: clear to auscultation and percussion with normal breathing effort HEART: regular rate & rhythm and no murmurs and no lower extremity edema ABDOMEN:abdomen soft, non-tender and normal bowel sounds Musculoskeletal:no cyanosis of digits and no clubbing  NEURO: Grade 1-2 sensory and motor neuropathy in fingers and toes  LABORATORY DATA:  I have reviewed the data as listed   Chemistry      Component Value Date/Time   NA 139 08/26/2014 0848   NA 143 06/18/2014 1444   K 4.1 08/26/2014 0848   K 3.6* 06/18/2014 1444   CL 105 06/18/2014 1444   CO2 28 08/26/2014 0848   CO2 26 06/18/2014 1444   BUN 10.2 08/26/2014 0848   BUN 11 06/18/2014 1444   CREATININE 1.0 08/26/2014 0848   CREATININE 0.67 06/18/2014 1444      Component Value Date/Time   CALCIUM 8.3* 08/26/2014 0848   CALCIUM 9.0 06/18/2014 1444   ALKPHOS 161* 08/26/2014 0848   ALKPHOS 138* 06/18/2014 1444   AST 12 08/26/2014 0848   AST 15 06/18/2014 1444   ALT 20 08/26/2014 0848   ALT 22 06/18/2014 1444   BILITOT 0.39 08/26/2014 0848   BILITOT 0.5 06/18/2014 1444       Lab Results  Component Value Date   WBC 6.7 09/09/2014   HGB 10.7* 09/09/2014   HCT 32.4* 09/09/2014   MCV 96.7 09/09/2014   PLT 184 09/09/2014   NEUTROABS 3.6 09/09/2014   ASSESSMENT & PLAN:  Breast cancer of upper-outer quadrant of right female breast Right breast invasive ductal carcinoma 3.6 cm by MRI with enlarged right axillary lymph node biopsy proven to be positive for cancer , T2 N1 M0 stage IIB ER positive PR positive HER-2 negative Ki-67 22%  Current treatment:Dose dense Adriamycin and Cytoxan every 2 weeks 4 followed by Taxol weekly 12;  Completed 4 cycles of AC; start Abraxane in one week weekly 12  Toxicities to chemotherapy: 1. Tiredness 2. Muscle aches and pains 3. 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 This has started even before giving Taxol. Hence I do not believe that she can tolerate Taxol. I recommended that we switch her treatment with Abraxane. We will request another prior authorization on Abraxane. She will start Abraxane next week. 4. Severe hypertension: She was taking metoprolol 50 twice a day. I instructed her to add lisinopril that was prescribed by her primary care physician 10 mg once a day.  Monitoring closely for chemotherapy toxicities  Return to clinic in 2 weeks to evaluate toxicity to Abraxane.  No orders of the defined types were placed in this encounter.   The patient has a good understanding of the overall plan. she agrees with it. She will call with any problems that may develop before her next visit here.   Rulon Eisenmenger, MD

## 2014-09-09 NOTE — Telephone Encounter (Signed)
Per staff message and POF I have scheduled appts. Advised scheduler of appts. JMW  

## 2014-09-09 NOTE — Telephone Encounter (Signed)
Pt appts made and avs printed for pt,email to michelle to add and alter chemo

## 2014-09-10 ENCOUNTER — Encounter: Payer: Self-pay | Admitting: *Deleted

## 2014-09-10 NOTE — Progress Notes (Signed)
[place] Work  Hol[place] representative met with patient in office at [facility] to follow up and offer support regarding financial concerns.  CSW and patient reviewed applications for financial assistance and made a list of the remaining information needed to complete. ([employer], [employer]).  Patient also expressed concern for the status of her Social Security Disability application.  CSW and patient contacted the appropriate people and confirmed her medical records had been sent to [facility].  CSW encouraged patient to call [employer] to confirm the received the records and get information on the remaining items needed to complete her application.  Patient verbalized understanding and plans to contact [employer].  Patient has also applied for a program through her [place] to prevent foreclosure, but has not been updated on the status.  Patient stated she planned to call this week to check the status.  CSW will assist patient in completing financial assistance applications.  Once patient return all requested information applications can be submitted.  CSW encouraged patient to call with questions or concerns.    Johnnye Lana, MSW, LCSW, OSW-C Clinical Social Worker [facility] [phone]

## 2014-09-16 ENCOUNTER — Other Ambulatory Visit: Payer: Self-pay | Admitting: *Deleted

## 2014-09-16 ENCOUNTER — Other Ambulatory Visit (HOSPITAL_BASED_OUTPATIENT_CLINIC_OR_DEPARTMENT_OTHER): Payer: Medicaid Other

## 2014-09-16 ENCOUNTER — Other Ambulatory Visit: Payer: Medicaid Other | Admitting: Hematology and Oncology

## 2014-09-16 ENCOUNTER — Encounter: Payer: Self-pay | Admitting: *Deleted

## 2014-09-16 ENCOUNTER — Telehealth: Payer: Self-pay | Admitting: *Deleted

## 2014-09-16 ENCOUNTER — Ambulatory Visit (HOSPITAL_BASED_OUTPATIENT_CLINIC_OR_DEPARTMENT_OTHER): Payer: Medicaid Other

## 2014-09-16 VITALS — BP 182/68 | HR 87 | Temp 98.0°F | Resp 18

## 2014-09-16 DIAGNOSIS — C50411 Malignant neoplasm of upper-outer quadrant of right female breast: Secondary | ICD-10-CM

## 2014-09-16 DIAGNOSIS — C773 Secondary and unspecified malignant neoplasm of axilla and upper limb lymph nodes: Secondary | ICD-10-CM

## 2014-09-16 DIAGNOSIS — C50911 Malignant neoplasm of unspecified site of right female breast: Secondary | ICD-10-CM

## 2014-09-16 DIAGNOSIS — Z5111 Encounter for antineoplastic chemotherapy: Secondary | ICD-10-CM

## 2014-09-16 LAB — CBC WITH DIFFERENTIAL/PLATELET
BASO%: 0.3 % (ref 0.0–2.0)
Basophils Absolute: 0 10*3/uL (ref 0.0–0.1)
EOS%: 1.1 % (ref 0.0–7.0)
Eosinophils Absolute: 0.1 10*3/uL (ref 0.0–0.5)
HCT: 33.8 % — ABNORMAL LOW (ref 34.8–46.6)
HGB: 11.2 g/dL — ABNORMAL LOW (ref 11.6–15.9)
LYMPH#: 1.8 10*3/uL (ref 0.9–3.3)
LYMPH%: 23.6 % (ref 14.0–49.7)
MCH: 32.3 pg (ref 25.1–34.0)
MCHC: 33.1 g/dL (ref 31.5–36.0)
MCV: 97.4 fL (ref 79.5–101.0)
MONO#: 0.9 10*3/uL (ref 0.1–0.9)
MONO%: 11.4 % (ref 0.0–14.0)
NEUT%: 63.6 % (ref 38.4–76.8)
NEUTROS ABS: 4.7 10*3/uL (ref 1.5–6.5)
Platelets: 246 10*3/uL (ref 145–400)
RBC: 3.47 10*6/uL — AB (ref 3.70–5.45)
RDW: 17.8 % — ABNORMAL HIGH (ref 11.2–14.5)
WBC: 7.4 10*3/uL (ref 3.9–10.3)

## 2014-09-16 LAB — COMPREHENSIVE METABOLIC PANEL (CC13)
ALK PHOS: 114 U/L (ref 40–150)
ALT: 18 U/L (ref 0–55)
AST: 15 U/L (ref 5–34)
Albumin: 3.2 g/dL — ABNORMAL LOW (ref 3.5–5.0)
Anion Gap: 8 mEq/L (ref 3–11)
BILIRUBIN TOTAL: 0.56 mg/dL (ref 0.20–1.20)
BUN: 6.1 mg/dL — AB (ref 7.0–26.0)
CO2: 26 mEq/L (ref 22–29)
Calcium: 8.2 mg/dL — ABNORMAL LOW (ref 8.4–10.4)
Chloride: 108 mEq/L (ref 98–109)
Creatinine: 0.8 mg/dL (ref 0.6–1.1)
GLUCOSE: 350 mg/dL — AB (ref 70–140)
Potassium: 3.5 mEq/L (ref 3.5–5.1)
Sodium: 141 mEq/L (ref 136–145)
Total Protein: 6.2 g/dL — ABNORMAL LOW (ref 6.4–8.3)

## 2014-09-16 MED ORDER — PACLITAXEL PROTEIN-BOUND CHEMO INJECTION 100 MG
80.0000 mg/m2 | Freq: Once | INTRAVENOUS | Status: AC
  Administered 2014-09-16: 175 mg via INTRAVENOUS
  Filled 2014-09-16: qty 35

## 2014-09-16 MED ORDER — SODIUM CHLORIDE 0.9 % IV SOLN
Freq: Once | INTRAVENOUS | Status: AC
  Administered 2014-09-16: 10:00:00 via INTRAVENOUS

## 2014-09-16 MED ORDER — HEPARIN SOD (PORK) LOCK FLUSH 100 UNIT/ML IV SOLN
500.0000 [IU] | Freq: Once | INTRAVENOUS | Status: AC | PRN
  Administered 2014-09-16: 500 [IU]
  Filled 2014-09-16: qty 5

## 2014-09-16 MED ORDER — METOPROLOL TARTRATE 50 MG PO TABS
50.0000 mg | ORAL_TABLET | Freq: Once | ORAL | Status: AC
  Administered 2014-09-16: 50 mg via ORAL
  Filled 2014-09-16: qty 1

## 2014-09-16 MED ORDER — SODIUM CHLORIDE 0.9 % IJ SOLN
10.0000 mL | INTRAMUSCULAR | Status: DC | PRN
  Administered 2014-09-16: 10 mL
  Filled 2014-09-16: qty 10

## 2014-09-16 MED ORDER — SODIUM CHLORIDE 0.9 % IV SOLN
Freq: Once | INTRAVENOUS | Status: AC
  Administered 2014-09-16: 10:00:00 via INTRAVENOUS
  Filled 2014-09-16: qty 4

## 2014-09-16 NOTE — Telephone Encounter (Signed)
Asked if she is to continue the blue pill.  Dexamethasone 4 mg spelled out during call.  Noted 09-09-2014 Dr. Lindi Adie discontinued this medicine.  Instructed not to continue this drug wit this treatment.  Instructed not to discard as she may need it later.  Verbalized understanding.

## 2014-09-16 NOTE — Progress Notes (Signed)
Informed Dr. Lindi Adie of high blood pressure.  He ordered to give pt one Metoprolol now before chemo.  Clarified pt's home BP meds.  She is to continue Metoprolol twice daily at home and Lisinopril once daily.  Pt states she had only been taking lisinopril.  She says she did not know to continue the Metoprolol.  She verbalized understanding to take both as prescribed.  I also suggested she check her blood pressure at home as pt feels "it is only high when I come here."  But she does not check it at home.   She verbalized understanding.  Pt's BP remains elevated prior to d/c.  Pt states again that she thinks it is only high when she comes here, but she will take her BP meds at home as prescribed. Encouraged her to buy a monitor for home or check it when she is at the drug store.  She verbalized understanding.

## 2014-09-16 NOTE — Patient Instructions (Signed)
[facility] Discharge Instructions for Patients Receiving Chemotherapy  Today you received the following chemotherapy agents; Abraxene.  To help prevent nausea and vomiting after your treatment, we encourage you to take your nausea medication as directed.   If you develop nausea and vomiting that is not controlled by your nausea medication, call the clinic.   BELOW ARE SYMPTOMS THAT SHOULD BE REPORTED IMMEDIATELY:  *FEVER GREATER THAN 100.5 F  *CHILLS WITH OR WITHOUT FEVER  NAUSEA AND VOMITING THAT IS NOT CONTROLLED WITH YOUR NAUSEA MEDICATION  *UNUSUAL SHORTNESS OF BREATH  *UNUSUAL BRUISING OR BLEEDING  TENDERNESS IN MOUTH AND THROAT WITH OR WITHOUT PRESENCE OF ULCERS  *URINARY PROBLEMS  *BOWEL PROBLEMS  UNUSUAL RASH Items with * indicate a potential emergency and should be followed up as soon as possible.  Feel free to call the clinic you have any questions or concerns. The clinic phone number is [phone].

## 2014-09-17 ENCOUNTER — Telehealth: Payer: Self-pay | Admitting: *Deleted

## 2014-09-17 NOTE — Telephone Encounter (Signed)
Patient received 1st Abraxane yesterday. Patient states that she is doing fine, had some chills yesterday evening but just went to bed and feels fine today. Denies any nausea, vomiting or diarrhea. States she is eating and drinking fine. Patient advised to call with any questions or concerns, patient verbalized understanding.

## 2014-09-19 ENCOUNTER — Other Ambulatory Visit: Payer: Self-pay | Admitting: Nurse Practitioner

## 2014-09-19 ENCOUNTER — Other Ambulatory Visit: Payer: Self-pay | Admitting: Hematology and Oncology

## 2014-09-19 DIAGNOSIS — C50411 Malignant neoplasm of upper-outer quadrant of right female breast: Secondary | ICD-10-CM

## 2014-09-19 MED ORDER — METOPROLOL TARTRATE 50 MG PO TABS: 50.0000 mg | ORAL_TABLET | Freq: Two times a day (BID) | ORAL | Status: DC

## 2014-09-19 MED ORDER — ONDANSETRON HCL 8 MG PO TABS: 8.0000 mg | ORAL_TABLET | Freq: Three times a day (TID) | ORAL | Status: DC | PRN

## 2014-09-19 MED ORDER — LORAZEPAM 0.5 MG PO TABS: 0.5000 mg | ORAL_TABLET | Freq: Four times a day (QID) | ORAL | Status: DC | PRN

## 2014-09-19 NOTE — Telephone Encounter (Signed)
Fax received from [facility] that patient is transferring all Rx's.  Metoprolol and Ondansetron requested at this time.  Lorazepam in EMR pending via surescript.  Called to confirm change in pharmacy and lorazepam is needed.  Reports she also needs sleeping pill but unable to tell me name of this medicine but she is out.  Instructed to call original pharmacy with this request.  informed her the OTC antihistamine benadryl helps with sleep. With previous conversations patient has been unable to tell this nurse names of medications only colors.  Unable to give description of "sleeping pill".

## 2014-09-23 ENCOUNTER — Other Ambulatory Visit (HOSPITAL_BASED_OUTPATIENT_CLINIC_OR_DEPARTMENT_OTHER): Payer: Medicaid Other

## 2014-09-23 ENCOUNTER — Telehealth: Payer: Self-pay | Admitting: Hematology and Oncology

## 2014-09-23 ENCOUNTER — Ambulatory Visit (HOSPITAL_BASED_OUTPATIENT_CLINIC_OR_DEPARTMENT_OTHER): Payer: Medicaid Other | Admitting: Hematology and Oncology

## 2014-09-23 ENCOUNTER — Ambulatory Visit (HOSPITAL_BASED_OUTPATIENT_CLINIC_OR_DEPARTMENT_OTHER): Payer: Medicaid Other

## 2014-09-23 VITALS — BP 185/74 | HR 81 | Temp 97.7°F | Resp 18 | Ht 65.0 in | Wt 242.2 lb

## 2014-09-23 DIAGNOSIS — C50411 Malignant neoplasm of upper-outer quadrant of right female breast: Secondary | ICD-10-CM

## 2014-09-23 DIAGNOSIS — I10 Essential (primary) hypertension: Secondary | ICD-10-CM | POA: Diagnosis not present

## 2014-09-23 DIAGNOSIS — C773 Secondary and unspecified malignant neoplasm of axilla and upper limb lymph nodes: Secondary | ICD-10-CM

## 2014-09-23 DIAGNOSIS — G620 Drug-induced polyneuropathy: Secondary | ICD-10-CM

## 2014-09-23 DIAGNOSIS — C50911 Malignant neoplasm of unspecified site of right female breast: Secondary | ICD-10-CM

## 2014-09-23 DIAGNOSIS — Z5111 Encounter for antineoplastic chemotherapy: Secondary | ICD-10-CM

## 2014-09-23 DIAGNOSIS — R5383 Other fatigue: Secondary | ICD-10-CM

## 2014-09-23 LAB — COMPREHENSIVE METABOLIC PANEL (CC13)
ALT: 21 U/L (ref 0–55)
ANION GAP: 9 meq/L (ref 3–11)
AST: 19 U/L (ref 5–34)
Albumin: 3.3 g/dL — ABNORMAL LOW (ref 3.5–5.0)
Alkaline Phosphatase: 94 U/L (ref 40–150)
BILIRUBIN TOTAL: 0.57 mg/dL (ref 0.20–1.20)
BUN: 8.9 mg/dL (ref 7.0–26.0)
CO2: 26 meq/L (ref 22–29)
CREATININE: 0.8 mg/dL (ref 0.6–1.1)
Calcium: 8.6 mg/dL (ref 8.4–10.4)
Chloride: 106 mEq/L (ref 98–109)
EGFR: >90 mL/min/{1.73_m2} (ref >90)
GLUCOSE: 268 mg/dL — AB (ref 70–140)
Potassium: 3.9 mEq/L (ref 3.5–5.1)
SODIUM: 141 meq/L (ref 136–145)
TOTAL PROTEIN: 6.6 g/dL (ref 6.4–8.3)

## 2014-09-23 LAB — CBC WITH DIFFERENTIAL/PLATELET
BASO%: 0.5 % (ref 0.0–2.0)
Basophils Absolute: 0 10*3/uL (ref 0.0–0.1)
EOS ABS: 0.1 10*3/uL (ref 0.0–0.5)
EOS%: 1.3 % (ref 0.0–7.0)
HCT: 33.5 % — ABNORMAL LOW (ref 34.8–46.6)
HEMOGLOBIN: 11.1 g/dL — AB (ref 11.6–15.9)
LYMPH#: 2.1 10*3/uL (ref 0.9–3.3)
LYMPH%: 28.1 % (ref 14.0–49.7)
MCH: 32.5 pg (ref 25.1–34.0)
MCHC: 33.1 g/dL (ref 31.5–36.0)
MCV: 98 fL (ref 79.5–101.0)
MONO#: 0.6 10*3/uL (ref 0.1–0.9)
MONO%: 8.5 % (ref 0.0–14.0)
NEUT%: 61.6 % (ref 38.4–76.8)
NEUTROS ABS: 4.6 10*3/uL (ref 1.5–6.5)
Platelets: 250 10*3/uL (ref 145–400)
RBC: 3.42 10*6/uL — AB (ref 3.70–5.45)
RDW: 16.8 % — AB (ref 11.2–14.5)
WBC: 7.4 10*3/uL (ref 3.9–10.3)

## 2014-09-23 MED ORDER — SODIUM CHLORIDE 0.9 % IV SOLN
Freq: Once | INTRAVENOUS | Status: AC
  Administered 2014-09-23: 16:00:00 via INTRAVENOUS

## 2014-09-23 MED ORDER — LORAZEPAM 0.5 MG PO TABS: 0.5000 mg | ORAL_TABLET | Freq: Four times a day (QID) | ORAL | Status: DC | PRN

## 2014-09-23 MED ORDER — METOPROLOL TARTRATE 50 MG PO TABS: 50.0000 mg | ORAL_TABLET | Freq: Two times a day (BID) | ORAL | Status: DC

## 2014-09-23 MED ORDER — SODIUM CHLORIDE 0.9 % IV SOLN
Freq: Once | INTRAVENOUS | Status: AC
  Administered 2014-09-23: 16:00:00 via INTRAVENOUS
  Filled 2014-09-23: qty 4

## 2014-09-23 MED ORDER — LISINOPRIL 10 MG PO TABS: 10.0000 mg | ORAL_TABLET | Freq: Every day | ORAL | Status: DC

## 2014-09-23 MED ORDER — LORAZEPAM 0.5 MG PO TABS
0.5000 mg | ORAL_TABLET | Freq: Four times a day (QID) | ORAL | Status: DC | PRN
Start: 2014-09-23 — End: 2014-09-23

## 2014-09-23 MED ORDER — HEPARIN SOD (PORK) LOCK FLUSH 100 UNIT/ML IV SOLN
500.0000 [IU] | Freq: Once | INTRAVENOUS | Status: AC | PRN
  Administered 2014-09-23: 500 [IU]
  Filled 2014-09-23: qty 5

## 2014-09-23 MED ORDER — LORAZEPAM 0.5 MG PO TABS
0.5000 mg | ORAL_TABLET | Freq: Four times a day (QID) | ORAL | Status: DC | PRN
Start: 2014-09-23 — End: 2014-10-31

## 2014-09-23 MED ORDER — PACLITAXEL PROTEIN-BOUND CHEMO INJECTION 100 MG
80.0000 mg/m2 | Freq: Once | INTRAVENOUS | Status: AC
  Administered 2014-09-23: 175 mg via INTRAVENOUS
  Filled 2014-09-23: qty 35

## 2014-09-23 MED ORDER — SODIUM CHLORIDE 0.9 % IJ SOLN
10.0000 mL | INTRAMUSCULAR | Status: DC | PRN
  Administered 2014-09-23: 10 mL
  Filled 2014-09-23: qty 10

## 2014-09-23 NOTE — Telephone Encounter (Signed)
3/22 pof appointments made and patient will get a new schedule at next appt  Marissa Haney

## 2014-09-23 NOTE — Progress Notes (Signed)
Patient Care Team: Cyndi Bender, PA-C as PCP - General (Physician Assistant)  DIAGNOSIS: No matching staging information was found for the patient.  SUMMARY OF ONCOLOGIC HISTORY:   Breast cancer of upper-outer quadrant of right female breast   05/27/2014 Mammogram Right breast mass by mammogram 5.4 cm by ultrasound 4 x 4 X 2 cm   05/28/2014 Initial Biopsy Right breast biopsy 11:30: Invasive ductal carcinoma with lymphovascular invasion grade 1/2, ER 100%, PR 97%, Ki-67 22%, HER-2 negative ratio 1.26 average copy #1.7, lymph node biopsy also positive   06/16/2014 Breast MRI right breast cancer: 3.6 cm at 12:00 position mildly enlarged right axillary lymph node: 5 mm oval enhancing mass in the central left breast could be an intramammary lymph node   07/10/2014 -  Neo-Adjuvant Chemotherapy Dose dense Adriamycin and Cytoxan 4 followed by Taxol 12   07/10/2014 Procedure PREVENT clinical trial (Lipitor vs Placebo) in patients undergoing cardiotoxic chemotherapy    CHIEF COMPLIANT: Abraxane 2/12  INTERVAL HISTORY: Marissa Haney is a 59 year old lady with above-mentioned history of right-sided breast cancer treated on neoadjuvant chemotherapy with dose dense Adriamycin Cytoxan and she is now on weekly Abraxane. She is tolerated first week of approximately well. Today her blood pressure extremely high. She reports that she has not been taking her blood pressure pills. She is also having difficulty with sleeping and ran out of Ativan.   REVIEW OF SYSTEMS:   Constitutional: Denies fevers, chills or abnormal weight loss Eyes: Denies blurriness of vision Ears, nose, mouth, throat, and face: Denies mucositis or sore throat Respiratory: Denies cough, dyspnea or wheezes Cardiovascular: Denies palpitation, chest discomfort or lower extremity swelling Gastrointestinal:  Denies nausea, heartburn or change in bowel habits Skin: Denies abnormal skin rashes Lymphatics: Denies new lymphadenopathy or easy  bruising Neurological:Denies numbness, tingling or new weaknesses Behavioral/Psych: Mood is stable, no new changes  Breast:  denies any pain or lumps or nodules in either breasts All other systems were reviewed with the patient and are negative.  I have reviewed the past medical history, past surgical history, social history and family history with the patient and they are unchanged from previous note.  ALLERGIES:  has No Known Allergies.  MEDICATIONS:  Current Outpatient Prescriptions  Medication Sig Dispense Refill  . ALPRAZolam (XANAX) 0.25 MG tablet Take 1 tablet (0.25 mg total) by mouth 2 (two) times daily as needed for anxiety. 30 tablet 0  . glipiZIDE (GLUCOTROL) 5 MG tablet Take 5 mg by mouth 2 (two) times daily before a meal.    . HYDROcodone-acetaminophen (NORCO) 5-325 MG per tablet Take 1-2 tablets by mouth every 6 (six) hours as needed for moderate pain or severe pain. 30 tablet 0  . lisinopril (PRINIVIL,ZESTRIL) 10 MG tablet Take 1 tablet (10 mg total) by mouth daily. 30 tablet 3  . LORazepam (ATIVAN) 0.5 MG tablet Take 1 tablet (0.5 mg total) by mouth every 6 (six) hours as needed (Nausea or vomiting). 30 tablet 0  . meclizine (ANTIVERT) 25 MG tablet Take 1 tablet (25 mg total) by mouth 2 (two) times daily. 60 tablet 3  . metFORMIN (GLUCOPHAGE) 500 MG tablet Take 500 mg by mouth.    . ondansetron (ZOFRAN) 8 MG tablet TAKE 1 TABLET BY MOUTH 2 TIMES DAILY AS NEEDED. START ON THE THIRD DAY AFTER CHEMOTHERAPY. 30 tablet 1  . oxyCODONE-acetaminophen (PERCOCET/ROXICET) 5-325 MG per tablet Take by mouth.    . metoprolol (LOPRESSOR) 50 MG tablet Take 1 tablet (50 mg total) by mouth  2 (two) times daily. 60 tablet 1   No current facility-administered medications for this visit.    PHYSICAL EXAMINATION: ECOG PERFORMANCE STATUS: 1 - Symptomatic but completely ambulatory  Filed Vitals:   09/23/14 1432  BP: 185/74  Pulse: 81  Temp: 97.7 F (36.5 C)  Resp: 18   Filed Weights    09/23/14 1432  Weight: 242 lb 3.2 oz (109.861 kg)    GENERAL:alert, no distress and comfortable SKIN: skin color, texture, turgor are normal, no rashes or significant lesions EYES: normal, Conjunctiva are pink and non-injected, sclera clear OROPHARYNX:no exudate, no erythema and lips, buccal mucosa, and tongue normal  NECK: supple, thyroid normal size, non-tender, without nodularity LYMPH:  no palpable lymphadenopathy in the cervical, axillary or inguinal LUNGS: clear to auscultation and percussion with normal breathing effort HEART: regular rate & rhythm and no murmurs and no lower extremity edema ABDOMEN:abdomen soft, non-tender and normal bowel sounds Musculoskeletal:no cyanosis of digits and no clubbing  NEURO: alert & oriented x 3 with fluent speech, grade 1 neuropathy stable   LABORATORY DATA:  I have reviewed the data as listed   Chemistry      Component Value Date/Time   NA 141 09/23/2014 1415   NA 143 06/18/2014 1444   K 3.9 09/23/2014 1415   K 3.6* 06/18/2014 1444   CL 105 06/18/2014 1444   CO2 26 09/23/2014 1415   CO2 26 06/18/2014 1444   BUN 8.9 09/23/2014 1415   BUN 11 06/18/2014 1444   CREATININE 0.8 09/23/2014 1415   CREATININE 0.67 06/18/2014 1444      Component Value Date/Time   CALCIUM 8.6 09/23/2014 1415   CALCIUM 9.0 06/18/2014 1444   ALKPHOS 94 09/23/2014 1415   ALKPHOS 138* 06/18/2014 1444   AST 19 09/23/2014 1415   AST 15 06/18/2014 1444   ALT 21 09/23/2014 1415   ALT 22 06/18/2014 1444   BILITOT 0.57 09/23/2014 1415   BILITOT 0.5 06/18/2014 1444       Lab Results  Component Value Date   WBC 7.4 09/23/2014   HGB 11.1* 09/23/2014   HCT 33.5* 09/23/2014   MCV 98.0 09/23/2014   PLT 250 09/23/2014   NEUTROABS 4.6 09/23/2014    ASSESSMENT & PLAN:  Breast cancer of upper-outer quadrant of right female breast Right breast invasive ductal carcinoma 3.6 cm by MRI with enlarged right axillary lymph node biopsy proven to be positive for cancer ,  T2 N1 M0 stage IIB ER positive PR positive HER-2 negative Ki-67 22%  Current treatment:Dose dense Adriamycin and Cytoxan every 2 weeks 4 followed by Taxol weekly 12; Completed 4 cycles of AC; Abraxane in 2/12  Toxicities to chemotherapy: 1. Tiredness 2. Muscle aches and pains 3. 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 This has started even before giving Taxol. Hence I do not believe that she can tolerate Taxol. I recommended that we switch her treatment with Abraxane. We will request another prior authorization on Abraxane. She will start Abraxane next week. 4. Severe hypertension: She was taking metoprolol 50 twice a day. I instructed her to add lisinopril that was prescribed by her primary care physician 10 mg once a day. Blood pressure is still elevated today. She reported that she did not take any of her medications for the past 3 days. I renewed all of her blood pressure medications. I instructed her that she needs to take it today as soon as she gets home otherwise she could have a major neurological event like stroke.  Monitoring closely for chemotherapy toxicities  Return to clinic in 2 weeks to evaluate toxicity to Abraxane.    No orders of the defined types were placed in this encounter.   The patient has a good understanding of the overall plan. she agrees with it. She will call with any problems that may develop before her next visit here.   Rulon Eisenmenger, MD

## 2014-09-23 NOTE — Patient Instructions (Signed)
[facility] Discharge Instructions for Patients Receiving Chemotherapy  Today you received the following chemotherapy agents Abraxane  To help prevent nausea and vomiting after your treatment, we encourage you to take your nausea medication Zofran twice a day as needed.   If you develop nausea and vomiting that is not controlled by your nausea medication, call the clinic.   BELOW ARE SYMPTOMS THAT SHOULD BE REPORTED IMMEDIATELY:  *FEVER GREATER THAN 100.5 F  *CHILLS WITH OR WITHOUT FEVER  NAUSEA AND VOMITING THAT IS NOT CONTROLLED WITH YOUR NAUSEA MEDICATION  *UNUSUAL SHORTNESS OF BREATH  *UNUSUAL BRUISING OR BLEEDING  TENDERNESS IN MOUTH AND THROAT WITH OR WITHOUT PRESENCE OF ULCERS  *URINARY PROBLEMS  *BOWEL PROBLEMS  UNUSUAL RASH Items with * indicate a potential emergency and should be followed up as soon as possible.  Feel free to call the clinic you have any questions or concerns. The clinic phone number is [phone].  Please show the [place] at check-in to the Emergency Department and triage nurse.

## 2014-09-23 NOTE — Assessment & Plan Note (Signed)
Right breast invasive ductal carcinoma 3.6 cm by MRI with enlarged right axillary lymph node biopsy proven to be positive for cancer , T2 N1 M0 stage IIB ER positive PR positive HER-2 negative Ki-67 22%  Current treatment:Dose dense Adriamycin and Cytoxan every 2 weeks 4 followed by Taxol weekly 12; Completed 4 cycles of AC; Abraxane in 2/12  Toxicities to chemotherapy: 1. Tiredness 2. Muscle aches and pains 3. 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 This has started even before giving Taxol. Hence I do not believe that she can tolerate Taxol. I recommended that we switch her treatment with Abraxane. We will request another prior authorization on Abraxane. She will start Abraxane next week. 4. Severe hypertension: She was taking metoprolol 50 twice a day. I instructed her to add lisinopril that was prescribed by her primary care physician 10 mg once a day. Blood pressure is still elevated today. She reported that she did not take any of her medications for the past 3 days. I renewed all of her blood pressure medications. I instructed her that she needs to take it today as soon as she gets home otherwise she could have a major neurological event like stroke.   Monitoring closely for chemotherapy toxicities  Return to clinic in 2 weeks to evaluate toxicity to Abraxane.

## 2014-09-25 ENCOUNTER — Encounter: Payer: Self-pay | Admitting: *Deleted

## 2014-09-25 NOTE — Progress Notes (Signed)
[place] Work  Clinical Social Work completed [employer] and mailed off to the [employer] on behalf of pt for assistance with her mortgage. CSW phoned pt and updated her on status as well. She has not had any contact from [facility] or other organizations that [place] had applied for assistance through. CSW team to follow and assist as appropriate.    Clinical Social Work interventions: Human resources officer  Resource asst  Loren Racer, [place] Social Worker [place]  [place] Phone: [phone] Fax: [phone]

## 2014-09-30 ENCOUNTER — Ambulatory Visit (HOSPITAL_BASED_OUTPATIENT_CLINIC_OR_DEPARTMENT_OTHER): Payer: Medicaid Other

## 2014-09-30 ENCOUNTER — Other Ambulatory Visit (HOSPITAL_BASED_OUTPATIENT_CLINIC_OR_DEPARTMENT_OTHER): Payer: Medicaid Other

## 2014-09-30 DIAGNOSIS — C50411 Malignant neoplasm of upper-outer quadrant of right female breast: Secondary | ICD-10-CM

## 2014-09-30 DIAGNOSIS — Z5111 Encounter for antineoplastic chemotherapy: Secondary | ICD-10-CM

## 2014-09-30 DIAGNOSIS — C50911 Malignant neoplasm of unspecified site of right female breast: Secondary | ICD-10-CM

## 2014-09-30 DIAGNOSIS — C773 Secondary and unspecified malignant neoplasm of axilla and upper limb lymph nodes: Secondary | ICD-10-CM

## 2014-09-30 LAB — CBC WITH DIFFERENTIAL/PLATELET
BASO%: 0.9 % (ref 0.0–2.0)
Basophils Absolute: 0.1 10*3/uL (ref 0.0–0.1)
EOS ABS: 0.2 10*3/uL (ref 0.0–0.5)
EOS%: 2.8 % (ref 0.0–7.0)
HCT: 32.5 % — ABNORMAL LOW (ref 34.8–46.6)
HGB: 10.6 g/dL — ABNORMAL LOW (ref 11.6–15.9)
LYMPH%: 24.8 % (ref 14.0–49.7)
MCH: 31.7 pg (ref 25.1–34.0)
MCHC: 32.5 g/dL (ref 31.5–36.0)
MCV: 97.7 fL (ref 79.5–101.0)
MONO#: 0.5 10*3/uL (ref 0.1–0.9)
MONO%: 6.5 % (ref 0.0–14.0)
NEUT%: 65 % (ref 38.4–76.8)
NEUTROS ABS: 4.8 10*3/uL (ref 1.5–6.5)
PLATELETS: 267 10*3/uL (ref 145–400)
RBC: 3.33 10*6/uL — AB (ref 3.70–5.45)
RDW: 16.7 % — ABNORMAL HIGH (ref 11.2–14.5)
WBC: 7.5 10*3/uL (ref 3.9–10.3)
lymph#: 1.9 10*3/uL (ref 0.9–3.3)

## 2014-09-30 LAB — COMPREHENSIVE METABOLIC PANEL (CC13)
ALT: 18 U/L (ref 0–55)
AST: 15 U/L (ref 5–34)
Albumin: 3.4 g/dL — ABNORMAL LOW (ref 3.5–5.0)
Alkaline Phosphatase: 100 U/L (ref 40–150)
Anion Gap: 12 mEq/L — ABNORMAL HIGH (ref 3–11)
BUN: 7 mg/dL (ref 7.0–26.0)
CHLORIDE: 104 meq/L (ref 98–109)
CO2: 25 meq/L (ref 22–29)
Calcium: 8.6 mg/dL (ref 8.4–10.4)
Creatinine: 0.8 mg/dL (ref 0.6–1.1)
Glucose: 336 mg/dl — ABNORMAL HIGH (ref 70–140)
Potassium: 3.5 mEq/L (ref 3.5–5.1)
Sodium: 141 mEq/L (ref 136–145)
TOTAL PROTEIN: 6.7 g/dL (ref 6.4–8.3)
Total Bilirubin: 0.6 mg/dL (ref 0.20–1.20)

## 2014-09-30 MED ORDER — SODIUM CHLORIDE 0.9 % IJ SOLN
10.0000 mL | INTRAMUSCULAR | Status: DC | PRN
  Administered 2014-09-30: 10 mL
  Filled 2014-09-30: qty 10

## 2014-09-30 MED ORDER — ONDANSETRON HCL 40 MG/20ML IJ SOLN
Freq: Once | INTRAMUSCULAR | Status: AC
  Administered 2014-09-30: 15:00:00 via INTRAVENOUS
  Filled 2014-09-30: qty 4

## 2014-09-30 MED ORDER — PACLITAXEL PROTEIN-BOUND CHEMO INJECTION 100 MG
80.0000 mg/m2 | Freq: Once | INTRAVENOUS | Status: AC
  Administered 2014-09-30: 175 mg via INTRAVENOUS
  Filled 2014-09-30: qty 35

## 2014-09-30 MED ORDER — SODIUM CHLORIDE 0.9 % IV SOLN
Freq: Once | INTRAVENOUS | Status: AC
  Administered 2014-09-30: 15:00:00 via INTRAVENOUS

## 2014-09-30 MED ORDER — HEPARIN SOD (PORK) LOCK FLUSH 100 UNIT/ML IV SOLN
500.0000 [IU] | Freq: Once | INTRAVENOUS | Status: AC | PRN
  Administered 2014-09-30: 500 [IU]
  Filled 2014-09-30: qty 5

## 2014-09-30 NOTE — Patient Instructions (Signed)
[facility] Discharge Instructions for Patients Receiving Chemotherapy  Today you received the following chemotherapy agents:  Abraxane  To help prevent nausea and vomiting after your treatment, we encourage you to take your nausea medication as ordered per MD.   If you develop nausea and vomiting that is not controlled by your nausea medication, call the clinic.   BELOW ARE SYMPTOMS THAT SHOULD BE REPORTED IMMEDIATELY:  *FEVER GREATER THAN 100.5 F  *CHILLS WITH OR WITHOUT FEVER  NAUSEA AND VOMITING THAT IS NOT CONTROLLED WITH YOUR NAUSEA MEDICATION  *UNUSUAL SHORTNESS OF BREATH  *UNUSUAL BRUISING OR BLEEDING  TENDERNESS IN MOUTH AND THROAT WITH OR WITHOUT PRESENCE OF ULCERS  *URINARY PROBLEMS  *BOWEL PROBLEMS  UNUSUAL RASH Items with * indicate a potential emergency and should be followed up as soon as possible.  Feel free to call the clinic you have any questions or concerns. The clinic phone number is [phone].  Please show the CHEMO ALERT CARD at check-in to the Emergency Department and triage nurse.   

## 2014-10-07 ENCOUNTER — Ambulatory Visit (HOSPITAL_BASED_OUTPATIENT_CLINIC_OR_DEPARTMENT_OTHER): Payer: Medicaid Other

## 2014-10-07 ENCOUNTER — Ambulatory Visit: Payer: Medicaid Other | Admitting: Hematology and Oncology

## 2014-10-07 ENCOUNTER — Other Ambulatory Visit: Payer: Medicaid Other

## 2014-10-07 ENCOUNTER — Other Ambulatory Visit (HOSPITAL_BASED_OUTPATIENT_CLINIC_OR_DEPARTMENT_OTHER): Payer: Medicaid Other

## 2014-10-07 ENCOUNTER — Telehealth: Payer: Self-pay | Admitting: Hematology and Oncology

## 2014-10-07 ENCOUNTER — Ambulatory Visit (HOSPITAL_BASED_OUTPATIENT_CLINIC_OR_DEPARTMENT_OTHER): Payer: Medicaid Other | Admitting: Hematology and Oncology

## 2014-10-07 VITALS — BP 184/68 | HR 62 | Temp 97.4°F | Resp 18 | Ht 65.0 in | Wt 245.3 lb

## 2014-10-07 DIAGNOSIS — C50911 Malignant neoplasm of unspecified site of right female breast: Secondary | ICD-10-CM

## 2014-10-07 DIAGNOSIS — C50411 Malignant neoplasm of upper-outer quadrant of right female breast: Secondary | ICD-10-CM

## 2014-10-07 DIAGNOSIS — I10 Essential (primary) hypertension: Secondary | ICD-10-CM

## 2014-10-07 DIAGNOSIS — D6481 Anemia due to antineoplastic chemotherapy: Secondary | ICD-10-CM | POA: Diagnosis not present

## 2014-10-07 DIAGNOSIS — Z170 Estrogen receptor positive status [ER+]: Secondary | ICD-10-CM

## 2014-10-07 DIAGNOSIS — Z5111 Encounter for antineoplastic chemotherapy: Secondary | ICD-10-CM

## 2014-10-07 DIAGNOSIS — G620 Drug-induced polyneuropathy: Secondary | ICD-10-CM

## 2014-10-07 LAB — COMPREHENSIVE METABOLIC PANEL (CC13)
ALK PHOS: 119 U/L (ref 40–150)
ALT: 17 U/L (ref 0–55)
ANION GAP: 9 meq/L (ref 3–11)
AST: 15 U/L (ref 5–34)
Albumin: 3.4 g/dL — ABNORMAL LOW (ref 3.5–5.0)
BILIRUBIN TOTAL: 0.5 mg/dL (ref 0.20–1.20)
BUN: 11.7 mg/dL (ref 7.0–26.0)
CO2: 27 mEq/L (ref 22–29)
Calcium: 8.5 mg/dL (ref 8.4–10.4)
Chloride: 103 mEq/L (ref 98–109)
Creatinine: 0.9 mg/dL (ref 0.6–1.1)
EGFR: 80 mL/min/{1.73_m2} — ABNORMAL LOW (ref >90)
Glucose: 327 mg/dl — ABNORMAL HIGH (ref 70–140)
POTASSIUM: 4 meq/L (ref 3.5–5.1)
Sodium: 139 mEq/L (ref 136–145)
Total Protein: 6.9 g/dL (ref 6.4–8.3)

## 2014-10-07 LAB — CBC WITH DIFFERENTIAL/PLATELET
BASO%: 0.2 % (ref 0.0–2.0)
BASOS ABS: 0 10*3/uL (ref 0.0–0.1)
EOS%: 2.6 % (ref 0.0–7.0)
Eosinophils Absolute: 0.2 10*3/uL (ref 0.0–0.5)
HCT: 32.6 % — ABNORMAL LOW (ref 34.8–46.6)
HGB: 10.9 g/dL — ABNORMAL LOW (ref 11.6–15.9)
LYMPH%: 27.6 % (ref 14.0–49.7)
MCH: 32.6 pg (ref 25.1–34.0)
MCHC: 33.4 g/dL (ref 31.5–36.0)
MCV: 97.6 fL (ref 79.5–101.0)
MONO#: 0.5 10*3/uL (ref 0.1–0.9)
MONO%: 6.1 % (ref 0.0–14.0)
NEUT#: 5.5 10*3/uL (ref 1.5–6.5)
NEUT%: 63.5 % (ref 38.4–76.8)
Platelets: 251 10*3/uL (ref 145–400)
RBC: 3.34 10*6/uL — AB (ref 3.70–5.45)
RDW: 15.2 % — ABNORMAL HIGH (ref 11.2–14.5)
WBC: 8.7 10*3/uL (ref 3.9–10.3)
lymph#: 2.4 10*3/uL (ref 0.9–3.3)

## 2014-10-07 MED ORDER — HEPARIN SOD (PORK) LOCK FLUSH 100 UNIT/ML IV SOLN
500.0000 [IU] | Freq: Once | INTRAVENOUS | Status: AC | PRN
  Administered 2014-10-07: 500 [IU]
  Filled 2014-10-07: qty 5

## 2014-10-07 MED ORDER — SODIUM CHLORIDE 0.9 % IV SOLN
Freq: Once | INTRAVENOUS | Status: AC
  Administered 2014-10-07: 15:00:00 via INTRAVENOUS

## 2014-10-07 MED ORDER — PACLITAXEL PROTEIN-BOUND CHEMO INJECTION 100 MG
80.0000 mg/m2 | Freq: Once | INTRAVENOUS | Status: AC
  Administered 2014-10-07: 175 mg via INTRAVENOUS
  Filled 2014-10-07: qty 35

## 2014-10-07 MED ORDER — SODIUM CHLORIDE 0.9 % IJ SOLN
10.0000 mL | INTRAMUSCULAR | Status: DC | PRN
  Administered 2014-10-07: 10 mL
  Filled 2014-10-07: qty 10

## 2014-10-07 MED ORDER — SODIUM CHLORIDE 0.9 % IV SOLN
Freq: Once | INTRAVENOUS | Status: AC
  Administered 2014-10-07: 15:00:00 via INTRAVENOUS
  Filled 2014-10-07: qty 4

## 2014-10-07 NOTE — Patient Instructions (Signed)
[facility] Discharge Instructions for Patients Receiving Chemotherapy  Today you received the following chemotherapy agents :  Abraxane.  To help prevent nausea and vomiting after your treatment, we encourage you to take your nausea medication as prescribed by your physician.   If you develop nausea and vomiting that is not controlled by your nausea medication, call the clinic.   BELOW ARE SYMPTOMS THAT SHOULD BE REPORTED IMMEDIATELY:  *FEVER GREATER THAN 100.5 F  *CHILLS WITH OR WITHOUT FEVER  NAUSEA AND VOMITING THAT IS NOT CONTROLLED WITH YOUR NAUSEA MEDICATION  *UNUSUAL SHORTNESS OF BREATH  *UNUSUAL BRUISING OR BLEEDING  TENDERNESS IN MOUTH AND THROAT WITH OR WITHOUT PRESENCE OF ULCERS  *URINARY PROBLEMS  *BOWEL PROBLEMS  UNUSUAL RASH Items with * indicate a potential emergency and should be followed up as soon as possible.  Feel free to call the clinic you have any questions or concerns. The clinic phone number is [phone].  Please show the CHEMO ALERT CARD at check-in to the Emergency Department and triage nurse.   

## 2014-10-07 NOTE — Progress Notes (Signed)
1615 discharged, ambulatory alone in no distress.

## 2014-10-07 NOTE — Progress Notes (Signed)
If  Patient Care Team: Cyndi Bender, PA-C as PCP - General (Physician Assistant)  DIAGNOSIS: No matching staging information was found for the patient.  SUMMARY OF ONCOLOGIC HISTORY:   Breast cancer of upper-outer quadrant of right female breast   05/27/2014 Mammogram Right breast mass by mammogram 5.4 cm by ultrasound 4 x 4 X 2 cm   05/28/2014 Initial Biopsy Right breast biopsy 11:30: Invasive ductal carcinoma with lymphovascular invasion grade 1/2, ER 100%, PR 97%, Ki-67 22%, HER-2 negative ratio 1.26 average copy #1.7, lymph node biopsy also positive   06/16/2014 Breast MRI right breast cancer: 3.6 cm at 12:00 position mildly enlarged right axillary lymph node: 5 mm oval enhancing mass in the central left breast could be an intramammary lymph node   07/10/2014 -  Neo-Adjuvant Chemotherapy Dose dense Adriamycin and Cytoxan 4 followed by Taxol 12   07/10/2014 Procedure PREVENT clinical trial (Lipitor vs Placebo) in patients undergoing cardiotoxic chemotherapy    CHIEF COMPLIANT: Weak 4/12 Abraxane  INTERVAL HISTORY: Marissa Haney is a next-year-old with above-mentioned history of right-sided breast cancer on neoadjuvant chemotherapy. She is tolerating Abraxane very well today is week 4 of treatment. Denies any changes in her neuropathy. Continues to have issues with hypertension. We had added the lisinopril to metoprolol. She has not met her primary care physician yet.  REVIEW OF SYSTEMS:   Constitutional: Denies fevers, chills or abnormal weight loss Eyes: Denies blurriness of vision Ears, nose, mouth, throat, and face: Denies mucositis or sore throat Respiratory: Denies cough, dyspnea or wheezes Cardiovascular: Denies palpitation, chest discomfort or lower extremity swelling Gastrointestinal:  Denies nausea, heartburn or change in bowel habits Skin: Denies abnormal skin rashes Lymphatics: Denies new lymphadenopathy or easy bruising Neurological: Grade 1 peripheral  neuropathy Behavioral/Psych: Mood is stable, no new changes   All other systems were reviewed with the patient and are negative.  I have reviewed the past medical history, past surgical history, social history and family history with the patient and they are unchanged from previous note.  ALLERGIES:  has No Known Allergies.  MEDICATIONS:  Current Outpatient Prescriptions  Medication Sig Dispense Refill  . ALPRAZolam (XANAX) 0.25 MG tablet Take 1 tablet (0.25 mg total) by mouth 2 (two) times daily as needed for anxiety. 30 tablet 0  . glipiZIDE (GLUCOTROL) 5 MG tablet Take 5 mg by mouth 2 (two) times daily before a meal.    . HYDROcodone-acetaminophen (NORCO) 5-325 MG per tablet Take 1-2 tablets by mouth every 6 (six) hours as needed for moderate pain or severe pain. 30 tablet 0  . lisinopril (PRINIVIL,ZESTRIL) 10 MG tablet Take 1 tablet (10 mg total) by mouth daily. 30 tablet 3  . LORazepam (ATIVAN) 0.5 MG tablet Take 1 tablet (0.5 mg total) by mouth every 6 (six) hours as needed (Nausea or vomiting). 30 tablet 0  . meclizine (ANTIVERT) 25 MG tablet Take 1 tablet (25 mg total) by mouth 2 (two) times daily. 60 tablet 3  . metFORMIN (GLUCOPHAGE) 500 MG tablet Take 500 mg by mouth.    . metoprolol (LOPRESSOR) 50 MG tablet Take 1 tablet (50 mg total) by mouth 2 (two) times daily. 60 tablet 1  . ondansetron (ZOFRAN) 8 MG tablet TAKE 1 TABLET BY MOUTH 2 TIMES DAILY AS NEEDED. START ON THE THIRD DAY AFTER CHEMOTHERAPY. 30 tablet 1  . oxyCODONE-acetaminophen (PERCOCET/ROXICET) 5-325 MG per tablet Take by mouth.     No current facility-administered medications for this visit.    PHYSICAL EXAMINATION: ECOG  PERFORMANCE STATUS: 1 - Symptomatic but completely ambulatory  Filed Vitals:   10/07/14 1349  BP: 184/68  Pulse: 62  Temp: 97.4 F (36.3 C)  Resp: 18   Filed Weights   10/07/14 1349  Weight: 245 lb 4.8 oz (111.267 kg)    GENERAL:alert, no distress and comfortable SKIN: skin color,  texture, turgor are normal, no rashes or significant lesions EYES: normal, Conjunctiva are pink and non-injected, sclera clear OROPHARYNX:no exudate, no erythema and lips, buccal mucosa, and tongue normal  NECK: supple, thyroid normal size, non-tender, without nodularity LYMPH:  no palpable lymphadenopathy in the cervical, axillary or inguinal LUNGS: clear to auscultation and percussion with normal breathing effort HEART: regular rate & rhythm and no murmurs and no lower extremity edema ABDOMEN:abdomen soft, non-tender and normal bowel sounds Musculoskeletal:no cyanosis of digits and no clubbing  NEURO: alert & oriented x 3 with fluent speech, grade 1 peripheral neuropathy   LABORATORY DATA:  I have reviewed the data as listed   Chemistry      Component Value Date/Time   NA 141 09/30/2014 1350   NA 143 06/18/2014 1444   K 3.5 09/30/2014 1350   K 3.6* 06/18/2014 1444   CL 105 06/18/2014 1444   CO2 25 09/30/2014 1350   CO2 26 06/18/2014 1444   BUN 7.0 09/30/2014 1350   BUN 11 06/18/2014 1444   CREATININE 0.8 09/30/2014 1350   CREATININE 0.67 06/18/2014 1444      Component Value Date/Time   CALCIUM 8.6 09/30/2014 1350   CALCIUM 9.0 06/18/2014 1444   ALKPHOS 100 09/30/2014 1350   ALKPHOS 138* 06/18/2014 1444   AST 15 09/30/2014 1350   AST 15 06/18/2014 1444   ALT 18 09/30/2014 1350   ALT 22 06/18/2014 1444   BILITOT 0.60 09/30/2014 1350   BILITOT 0.5 06/18/2014 1444       Lab Results  Component Value Date   WBC 8.7 10/07/2014   HGB 10.9* 10/07/2014   HCT 32.6* 10/07/2014   MCV 97.6 10/07/2014   PLT 251 10/07/2014   NEUTROABS 5.5 10/07/2014    ASSESSMENT & PLAN:  Breast cancer of upper-outer quadrant of right female breast Right breast invasive ductal carcinoma 3.6 cm by MRI with enlarged right axillary lymph node biopsy proven to be positive for cancer , T2 N1 M0 stage IIB ER positive PR positive HER-2 negative Ki-67 22%  Current treatment:Dose dense Adriamycin  and Cytoxan every 2 weeks 4 followed by Taxol weekly 12; Completed 4 cycles of AC; Abraxane in 4/12  Toxicities to chemotherapy: 1. Tiredness 2. Muscle aches and pains 3. 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 This has started even before giving Taxol. Because of this we switched her treatment with Abraxane. Patient is tolerating Abraxane much better and appears to not have any worsening neuropathy. 4. Severe hypertension: She was taking metoprolol 50 twice a day and we added lisinopril. 5. Loss of taste 6. Alopecia 7. Chemotherapy-induced anemia grade 1   Monitoring closely for chemotherapy toxicities  Return to clinic in 2 weeks to evaluate toxicity to Abraxane.    No orders of the defined types were placed in this encounter.   The patient has a good understanding of the overall plan. she agrees with it. She will call with any problems that may develop before her next visit here.   Rulon Eisenmenger, MD

## 2014-10-07 NOTE — Assessment & Plan Note (Signed)
Right breast invasive ductal carcinoma 3.6 cm by MRI with enlarged right axillary lymph node biopsy proven to be positive for cancer , T2 N1 M0 stage IIB ER positive PR positive HER-2 negative Ki-67 22%  Current treatment:Dose dense Adriamycin and Cytoxan every 2 weeks 4 followed by Taxol weekly 12; Completed 4 cycles of AC; Abraxane in 4/12  Toxicities to chemotherapy: 1. Tiredness 2. Muscle aches and pains 3. 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 This has started even before giving Taxol. Because of this we switched her treatment with Abraxane. Patient is tolerating Abraxane much better and appears to not have any worsening neuropathy. 4. Severe hypertension: She was taking metoprolol 50 twice a day and we added lisinopril. 5. Loss of taste 6. Alopecia 7. Chemotherapy-induced anemia grade 1   Monitoring closely for chemotherapy toxicities  Return to clinic in 2 weeks to evaluate toxicity to Abraxane.

## 2014-10-07 NOTE — Telephone Encounter (Signed)
per pof ot sch pt appt-ok per VG to double book

## 2014-10-14 ENCOUNTER — Other Ambulatory Visit: Payer: Self-pay | Admitting: *Deleted

## 2014-10-14 ENCOUNTER — Ambulatory Visit (HOSPITAL_BASED_OUTPATIENT_CLINIC_OR_DEPARTMENT_OTHER): Payer: Medicaid Other

## 2014-10-14 ENCOUNTER — Other Ambulatory Visit (HOSPITAL_BASED_OUTPATIENT_CLINIC_OR_DEPARTMENT_OTHER): Payer: Medicaid Other

## 2014-10-14 DIAGNOSIS — C50911 Malignant neoplasm of unspecified site of right female breast: Secondary | ICD-10-CM

## 2014-10-14 DIAGNOSIS — C773 Secondary and unspecified malignant neoplasm of axilla and upper limb lymph nodes: Secondary | ICD-10-CM

## 2014-10-14 DIAGNOSIS — C50411 Malignant neoplasm of upper-outer quadrant of right female breast: Secondary | ICD-10-CM

## 2014-10-14 DIAGNOSIS — Z5111 Encounter for antineoplastic chemotherapy: Secondary | ICD-10-CM | POA: Diagnosis present

## 2014-10-14 LAB — CBC WITH DIFFERENTIAL/PLATELET
BASO%: 0.4 % (ref 0.0–2.0)
Basophils Absolute: 0 10*3/uL (ref 0.0–0.1)
EOS ABS: 0.2 10*3/uL (ref 0.0–0.5)
EOS%: 2.3 % (ref 0.0–7.0)
HEMATOCRIT: 33.8 % — AB (ref 34.8–46.6)
HGB: 11.4 g/dL — ABNORMAL LOW (ref 11.6–15.9)
LYMPH#: 2.7 10*3/uL (ref 0.9–3.3)
LYMPH%: 33 % (ref 14.0–49.7)
MCH: 33 pg (ref 25.1–34.0)
MCHC: 33.7 g/dL (ref 31.5–36.0)
MCV: 98 fL (ref 79.5–101.0)
MONO#: 0.4 10*3/uL (ref 0.1–0.9)
MONO%: 5 % (ref 0.0–14.0)
NEUT%: 59.3 % (ref 38.4–76.8)
NEUTROS ABS: 4.9 10*3/uL (ref 1.5–6.5)
Platelets: 248 10*3/uL (ref 145–400)
RBC: 3.45 10*6/uL — ABNORMAL LOW (ref 3.70–5.45)
RDW: 14.6 % — ABNORMAL HIGH (ref 11.2–14.5)
WBC: 8.2 10*3/uL (ref 3.9–10.3)

## 2014-10-14 LAB — COMPREHENSIVE METABOLIC PANEL (CC13)
ALBUMIN: 3.4 g/dL — AB (ref 3.5–5.0)
ALK PHOS: 116 U/L (ref 40–150)
ALT: 17 U/L (ref 0–55)
AST: 19 U/L (ref 5–34)
Anion Gap: 6 mEq/L (ref 3–11)
BILIRUBIN TOTAL: 0.41 mg/dL (ref 0.20–1.20)
BUN: 8.9 mg/dL (ref 7.0–26.0)
CO2: 29 mEq/L (ref 22–29)
Calcium: 8.6 mg/dL (ref 8.4–10.4)
Chloride: 107 mEq/L (ref 98–109)
Creatinine: 0.7 mg/dL (ref 0.6–1.1)
EGFR: >90 mL/min/{1.73_m2} (ref >90)
Glucose: 242 mg/dl — ABNORMAL HIGH (ref 70–140)
Potassium: 3.7 mEq/L (ref 3.5–5.1)
Sodium: 142 mEq/L (ref 136–145)
Total Protein: 6.8 g/dL (ref 6.4–8.3)

## 2014-10-14 MED ORDER — SODIUM CHLORIDE 0.9 % IV SOLN
Freq: Once | INTRAVENOUS | Status: AC
  Administered 2014-10-14: 15:00:00 via INTRAVENOUS
  Filled 2014-10-14: qty 4

## 2014-10-14 MED ORDER — HEPARIN SOD (PORK) LOCK FLUSH 100 UNIT/ML IV SOLN
500.0000 [IU] | Freq: Once | INTRAVENOUS | Status: AC | PRN
  Administered 2014-10-14: 500 [IU]
  Filled 2014-10-14: qty 5

## 2014-10-14 MED ORDER — SODIUM CHLORIDE 0.9 % IJ SOLN
10.0000 mL | INTRAMUSCULAR | Status: DC | PRN
  Administered 2014-10-14: 10 mL
  Filled 2014-10-14: qty 10

## 2014-10-14 MED ORDER — PACLITAXEL PROTEIN-BOUND CHEMO INJECTION 100 MG
80.0000 mg/m2 | Freq: Once | INTRAVENOUS | Status: AC
  Administered 2014-10-14: 175 mg via INTRAVENOUS
  Filled 2014-10-14: qty 35

## 2014-10-14 MED ORDER — SODIUM CHLORIDE 0.9 % IV SOLN
Freq: Once | INTRAVENOUS | Status: AC
  Administered 2014-10-14: 15:00:00 via INTRAVENOUS

## 2014-10-14 NOTE — Patient Instructions (Signed)
[facility] Discharge Instructions for Patients Receiving Chemotherapy  Today you received the following chemotherapy agents :  Abraxane.  To help prevent nausea and vomiting after your treatment, we encourage you to take your nausea medication as prescribed by your physician.   If you develop nausea and vomiting that is not controlled by your nausea medication, call the clinic.   BELOW ARE SYMPTOMS THAT SHOULD BE REPORTED IMMEDIATELY:  *FEVER GREATER THAN 100.5 F  *CHILLS WITH OR WITHOUT FEVER  NAUSEA AND VOMITING THAT IS NOT CONTROLLED WITH YOUR NAUSEA MEDICATION  *UNUSUAL SHORTNESS OF BREATH  *UNUSUAL BRUISING OR BLEEDING  TENDERNESS IN MOUTH AND THROAT WITH OR WITHOUT PRESENCE OF ULCERS  *URINARY PROBLEMS  *BOWEL PROBLEMS  UNUSUAL RASH Items with * indicate a potential emergency and should be followed up as soon as possible.  Feel free to call the clinic you have any questions or concerns. The clinic phone number is [phone].  Please show the CHEMO ALERT CARD at check-in to the Emergency Department and triage nurse.   

## 2014-10-14 NOTE — Progress Notes (Signed)
Per Dr. Lindi Adie, okay to use 10/07/14 CMET results for today's tx.

## 2014-10-21 ENCOUNTER — Other Ambulatory Visit (HOSPITAL_BASED_OUTPATIENT_CLINIC_OR_DEPARTMENT_OTHER): Payer: Medicaid Other

## 2014-10-21 ENCOUNTER — Ambulatory Visit (HOSPITAL_BASED_OUTPATIENT_CLINIC_OR_DEPARTMENT_OTHER): Payer: Medicaid Other

## 2014-10-21 ENCOUNTER — Ambulatory Visit (HOSPITAL_BASED_OUTPATIENT_CLINIC_OR_DEPARTMENT_OTHER): Payer: Medicaid Other | Admitting: Hematology and Oncology

## 2014-10-21 ENCOUNTER — Telehealth: Payer: Self-pay | Admitting: Hematology and Oncology

## 2014-10-21 VITALS — BP 178/69 | HR 59 | Temp 97.7°F | Resp 18 | Ht 65.0 in | Wt 242.7 lb

## 2014-10-21 DIAGNOSIS — Z5111 Encounter for antineoplastic chemotherapy: Secondary | ICD-10-CM

## 2014-10-21 DIAGNOSIS — J069 Acute upper respiratory infection, unspecified: Secondary | ICD-10-CM

## 2014-10-21 DIAGNOSIS — I10 Essential (primary) hypertension: Secondary | ICD-10-CM | POA: Diagnosis not present

## 2014-10-21 DIAGNOSIS — C50911 Malignant neoplasm of unspecified site of right female breast: Secondary | ICD-10-CM

## 2014-10-21 DIAGNOSIS — C50411 Malignant neoplasm of upper-outer quadrant of right female breast: Secondary | ICD-10-CM | POA: Diagnosis not present

## 2014-10-21 LAB — COMPREHENSIVE METABOLIC PANEL (CC13)
ALBUMIN: 3.3 g/dL — AB (ref 3.5–5.0)
ALT: 18 U/L (ref 0–55)
AST: 18 U/L (ref 5–34)
Alkaline Phosphatase: 94 U/L (ref 40–150)
Anion Gap: 12 mEq/L — ABNORMAL HIGH (ref 3–11)
BUN: 7 mg/dL (ref 7.0–26.0)
CALCIUM: 8.2 mg/dL — AB (ref 8.4–10.4)
CO2: 22 mEq/L (ref 22–29)
Chloride: 107 mEq/L (ref 98–109)
Creatinine: 0.8 mg/dL (ref 0.6–1.1)
GLUCOSE: 272 mg/dL — AB (ref 70–140)
POTASSIUM: 3.3 meq/L — AB (ref 3.5–5.1)
SODIUM: 141 meq/L (ref 136–145)
TOTAL PROTEIN: 6.6 g/dL (ref 6.4–8.3)
Total Bilirubin: 0.44 mg/dL (ref 0.20–1.20)

## 2014-10-21 LAB — CBC WITH DIFFERENTIAL/PLATELET
BASO%: 0.3 % (ref 0.0–2.0)
Basophils Absolute: 0 10*3/uL (ref 0.0–0.1)
EOS%: 2.4 % (ref 0.0–7.0)
Eosinophils Absolute: 0.2 10*3/uL (ref 0.0–0.5)
HCT: 32.6 % — ABNORMAL LOW (ref 34.8–46.6)
HGB: 11.1 g/dL — ABNORMAL LOW (ref 11.6–15.9)
LYMPH%: 32.5 % (ref 14.0–49.7)
MCH: 33.1 pg (ref 25.1–34.0)
MCHC: 34 g/dL (ref 31.5–36.0)
MCV: 97.3 fL (ref 79.5–101.0)
MONO#: 0.4 10*3/uL (ref 0.1–0.9)
MONO%: 6.2 % (ref 0.0–14.0)
NEUT#: 4.2 10*3/uL (ref 1.5–6.5)
NEUT%: 58.6 % (ref 38.4–76.8)
Platelets: 212 10*3/uL (ref 145–400)
RBC: 3.35 10*6/uL — ABNORMAL LOW (ref 3.70–5.45)
RDW: 14.1 % (ref 11.2–14.5)
WBC: 7.1 10*3/uL (ref 3.9–10.3)
lymph#: 2.3 10*3/uL (ref 0.9–3.3)

## 2014-10-21 MED ORDER — HEPARIN SOD (PORK) LOCK FLUSH 100 UNIT/ML IV SOLN
500.0000 [IU] | Freq: Once | INTRAVENOUS | Status: AC | PRN
  Administered 2014-10-21: 500 [IU]
  Filled 2014-10-21: qty 5

## 2014-10-21 MED ORDER — SODIUM CHLORIDE 0.9 % IV SOLN
Freq: Once | INTRAVENOUS | Status: AC
  Administered 2014-10-21: 15:00:00 via INTRAVENOUS

## 2014-10-21 MED ORDER — AZITHROMYCIN 250 MG PO TABS: ORAL_TABLET | ORAL | Status: DC

## 2014-10-21 MED ORDER — SODIUM CHLORIDE 0.9 % IV SOLN
Freq: Once | INTRAVENOUS | Status: AC
  Administered 2014-10-21: 15:00:00 via INTRAVENOUS
  Filled 2014-10-21: qty 4

## 2014-10-21 MED ORDER — SODIUM CHLORIDE 0.9 % IJ SOLN
10.0000 mL | INTRAMUSCULAR | Status: DC | PRN
  Administered 2014-10-21: 10 mL
  Filled 2014-10-21: qty 10

## 2014-10-21 MED ORDER — PACLITAXEL PROTEIN-BOUND CHEMO INJECTION 100 MG
80.0000 mg/m2 | Freq: Once | INTRAVENOUS | Status: AC
  Administered 2014-10-21: 175 mg via INTRAVENOUS
  Filled 2014-10-21: qty 35

## 2014-10-21 NOTE — Patient Instructions (Addendum)
[facility] Discharge Instructions for Patients Receiving Chemotherapy  Today you received the following chemotherapy agents: Abraxane  To help prevent nausea and vomiting after your treatment, we encourage you to take your nausea medication as prescribed by your physician.   If you develop nausea and vomiting that is not controlled by your nausea medication, call the clinic.   BELOW ARE SYMPTOMS THAT SHOULD BE REPORTED IMMEDIATELY:  *FEVER GREATER THAN 100.5 F  *CHILLS WITH OR WITHOUT FEVER  NAUSEA AND VOMITING THAT IS NOT CONTROLLED WITH YOUR NAUSEA MEDICATION  *UNUSUAL SHORTNESS OF BREATH  *UNUSUAL BRUISING OR BLEEDING  TENDERNESS IN MOUTH AND THROAT WITH OR WITHOUT PRESENCE OF ULCERS  *URINARY PROBLEMS  *BOWEL PROBLEMS  UNUSUAL RASH Items with * indicate a potential emergency and should be followed up as soon as possible.  Feel free to call the clinic you have any questions or concerns. The clinic phone number is [phone].  Please show the [facility] at check-in to the Emergency Department and triage nurse.  Nanoparticle Albumin-Bound Paclitaxel injection What is this medicine? NANOPARTICLE ALBUMIN-BOUND PACLITAXEL (Na no PAHR ti kuhl al BYOO muhn-bound PAK li TAX el) is a chemotherapy drug. It targets fast dividing cells, like cancer cells, and causes these cells to die. This medicine is used to treat advanced breast cancer and advanced lung cancer. This medicine may be used for other purposes; ask your health care provider or pharmacist if you have questions. COMMON BRAND NAME(S): Abraxane What should I tell my health care provider before I take this medicine? They need to know if you have any of these conditions: -kidney disease -liver disease -low blood counts, like low platelets, red blood cells, or white blood cells -recent or ongoing radiation therapy -an unusual or allergic reaction to paclitaxel, albumin, other chemotherapy, other  medicines, foods, dyes, or preservatives -pregnant or trying to get pregnant -breast-feeding How should I use this medicine? This drug is given as an infusion into a vein. It is administered in a hospital or clinic by a specially trained health care professional. Talk to your pediatrician regarding the use of this medicine in children. Special care may be needed. Overdosage: If you think you have taken too much of this medicine contact a poison control center or emergency room at once. NOTE: This medicine is only for you. Do not share this medicine with others. What if I miss a dose? It is important not to miss your dose. Call your doctor or health care professional if you are unable to keep an appointment. What may interact with this medicine? -cyclosporine -diazepam -ketoconazole -medicines to increase blood counts like filgrastim, pegfilgrastim, sargramostim -other chemotherapy drugs like cisplatin, doxorubicin, epirubicin, etoposide, teniposide, vincristine -quinidine -testosterone -vaccines -verapamil Talk to your doctor or health care professional before taking any of these medicines: -acetaminophen -aspirin -ibuprofen -ketoprofen -naproxen This list may not describe all possible interactions. Give your health care provider a list of all the medicines, herbs, non-prescription drugs, or dietary supplements you use. Also tell them if you smoke, drink alcohol, or use illegal drugs. Some items may interact with your medicine. What should I watch for while using this medicine? Your condition will be monitored carefully while you are receiving this medicine. You will need important blood work done while you are taking this medicine. This drug may make you feel generally unwell. This is not uncommon, as chemotherapy can affect healthy cells as well as cancer cells. Report any side effects. Continue your course  of treatment even though you feel ill unless your doctor tells you to stop. In  some cases, you may be given additional medicines to help with side effects. Follow all directions for their use. Call your doctor or health care professional for advice if you get a fever, chills or sore throat, or other symptoms of a cold or flu. Do not treat yourself. This drug decreases your body's ability to fight infections. Try to avoid being around people who are sick. This medicine may increase your risk to bruise or bleed. Call your doctor or health care professional if you notice any unusual bleeding. Be careful brushing and flossing your teeth or using a toothpick because you may get an infection or bleed more easily. If you have any dental work done, tell your dentist you are receiving this medicine. Avoid taking products that contain aspirin, acetaminophen, ibuprofen, naproxen, or ketoprofen unless instructed by your doctor. These medicines may hide a fever. Do not become pregnant while taking this medicine. Women should inform their doctor if they wish to become pregnant or think they might be pregnant. There is a potential for serious side effects to an unborn child. Talk to your health care professional or pharmacist for more information. Do not breast-feed an infant while taking this medicine. Men are advised not to father a child while receiving this medicine. What side effects may I notice from receiving this medicine? Side effects that you should report to your doctor or health care professional as soon as possible: -allergic reactions like skin rash, itching or hives, swelling of the face, lips, or tongue -low blood counts - This drug may decrease the number of white blood cells, red blood cells and platelets. You may be at increased risk for infections and bleeding. -signs of infection - fever or chills, cough, sore throat, pain or difficulty passing urine -signs of decreased platelets or bleeding - bruising, pinpoint red spots on the skin, black, tarry stools, nosebleeds -signs  of decreased red blood cells - unusually weak or tired, fainting spells, lightheadedness -breathing problems -changes in vision -chest pain -high or low blood pressure -mouth sores -nausea and vomiting -pain, swelling, redness or irritation at the injection site -pain, tingling, numbness in the hands or feet -slow or irregular heartbeat -swelling of the ankle, feet, hands Side effects that usually do not require medical attention (report to your doctor or health care professional if they continue or are bothersome): -aches, pains -changes in the color of fingernails -diarrhea -hair loss -loss of appetite This list may not describe all possible side effects. Call your doctor for medical advice about side effects. You may report side effects to FDA at 1-800-FDA-1088. Where should I keep my medicine? This drug is given in a hospital or clinic and will not be stored at home. NOTE: This sheet is a summary. It may not cover all possible information. If you have questions about this medicine, talk to your doctor, pharmacist, or health care provider.  2015, [employer]. (2012-08-13 16:48:50)

## 2014-10-21 NOTE — Progress Notes (Signed)
Patient Care Team: Cyndi Bender, PA-C as PCP - General (Physician Assistant)  DIAGNOSIS: No matching staging information was found for the patient.  SUMMARY OF ONCOLOGIC HISTORY:   Breast cancer of upper-outer quadrant of right female breast   05/27/2014 Mammogram Right breast mass by mammogram 5.4 cm by ultrasound 4 x 4 X 2 cm   05/28/2014 Initial Biopsy Right breast biopsy 11:30: Invasive ductal carcinoma with lymphovascular invasion grade 1/2, ER 100%, PR 97%, Ki-67 22%, HER-2 negative ratio 1.26 average copy #1.7, lymph node biopsy also positive   06/16/2014 Breast MRI right breast cancer: 3.6 cm at 12:00 position mildly enlarged right axillary lymph node: 5 mm oval enhancing mass in the central left breast could be an intramammary lymph node   07/10/2014 -  Neo-Adjuvant Chemotherapy Dose dense Adriamycin and Cytoxan 4 followed by Taxol 12   07/10/2014 Procedure PREVENT clinical trial (Lipitor vs Placebo) in patients undergoing cardiotoxic chemotherapy    CHIEF COMPLIANT: Taxol 6/12  INTERVAL HISTORY: Marissa Haney is a 59-year-old lady with above-mentioned history right-sided breast cancer neoadjuvant chemotherapy. She is currently on Taxol treatments. Today's #6 of 12 treatments. Overall she is tolerating chemotherapy fairly well except recently over the past 4 days she has had cold with runny nose and slight cough with some muscle aches and pains. Her symptoms are not resolving in spite of conservative management. She denies any fevers or chills.  REVIEW OF SYSTEMS:   Constitutional: Denies fevers, chills or abnormal weight loss Eyes: Denies blurriness of vision Ears, nose, mouth, throat, and face: Runny nose Respiratory: Complains of cough with expectoration Cardiovascular: Denies palpitation, chest discomfort or lower extremity swelling Gastrointestinal:  Denies nausea, heartburn or change in bowel habits Skin: Denies abnormal skin rashes Lymphatics: Denies new lymphadenopathy or  easy bruising Neurological:Denies numbness, tingling or new weaknesses Behavioral/Psych: Mood is stable, no new changes  All other systems were reviewed with the patient and are negative.  I have reviewed the past medical history, past surgical history, social history and family history with the patient and they are unchanged from previous note.  ALLERGIES:  has No Known Allergies.  MEDICATIONS:  Current Outpatient Prescriptions  Medication Sig Dispense Refill  . ALPRAZolam (XANAX) 0.25 MG tablet Take 1 tablet (0.25 mg total) by mouth 2 (two) times daily as needed for anxiety. 30 tablet 0  . glipiZIDE (GLUCOTROL) 5 MG tablet Take 5 mg by mouth 2 (two) times daily before a meal.    . HYDROcodone-acetaminophen (NORCO) 5-325 MG per tablet Take 1-2 tablets by mouth every 6 (six) hours as needed for moderate pain or severe pain. 30 tablet 0  . lisinopril (PRINIVIL,ZESTRIL) 10 MG tablet Take 1 tablet (10 mg total) by mouth daily. 30 tablet 3  . LORazepam (ATIVAN) 0.5 MG tablet Take 1 tablet (0.5 mg total) by mouth every 6 (six) hours as needed (Nausea or vomiting). 30 tablet 0  . meclizine (ANTIVERT) 25 MG tablet Take 1 tablet (25 mg total) by mouth 2 (two) times daily. 60 tablet 3  . metFORMIN (GLUCOPHAGE) 500 MG tablet Take 500 mg by mouth.    . metoprolol (LOPRESSOR) 50 MG tablet Take 1 tablet (50 mg total) by mouth 2 (two) times daily. 60 tablet 1  . ondansetron (ZOFRAN) 8 MG tablet TAKE 1 TABLET BY MOUTH 2 TIMES DAILY AS NEEDED. START ON THE THIRD DAY AFTER CHEMOTHERAPY. 30 tablet 1  . oxyCODONE-acetaminophen (PERCOCET/ROXICET) 5-325 MG per tablet Take by mouth.    Marland Kitchen azithromycin (ZITHROMAX Z-PAK)  250 MG tablet Follow directions on pak 6 each 0   No current facility-administered medications for this visit.    PHYSICAL EXAMINATION: ECOG PERFORMANCE STATUS: 1 - Symptomatic but completely ambulatory  Filed Vitals:   10/21/14 1416  BP: 178/69  Pulse: 59  Temp: 97.7 F (36.5 C)  Resp:  18   Filed Weights   10/21/14 1416  Weight: 242 lb 11.2 oz (110.088 kg)    GENERAL:alert, no distress and comfortable SKIN: skin color, texture, turgor are normal, no rashes or significant lesions EYES: normal, Conjunctiva are pink and non-injected, sclera clear OROPHARYNX:no exudate, no erythema and lips, buccal mucosa, and tongue normal  NECK: supple, thyroid normal size, non-tender, without nodularity LYMPH:  no palpable lymphadenopathy in the cervical, axillary or inguinal LUNGS: clear to auscultation and percussion with normal breathing effort HEART: regular rate & rhythm and no murmurs and no lower extremity edema ABDOMEN:abdomen soft, non-tender and normal bowel sounds Musculoskeletal:no cyanosis of digits and no clubbing  NEURO: alert & oriented x 3 with fluent speech, no focal motor/sensory deficits   LABORATORY DATA:  I have reviewed the data as listed   Chemistry      Component Value Date/Time   NA 141 10/21/2014 1352   NA 143 06/18/2014 1444   K 3.3* 10/21/2014 1352   K 3.6* 06/18/2014 1444   CL 105 06/18/2014 1444   CO2 22 10/21/2014 1352   CO2 26 06/18/2014 1444   BUN 7.0 10/21/2014 1352   BUN 11 06/18/2014 1444   CREATININE 0.8 10/21/2014 1352   CREATININE 0.67 06/18/2014 1444      Component Value Date/Time   CALCIUM 8.2* 10/21/2014 1352   CALCIUM 9.0 06/18/2014 1444   ALKPHOS 94 10/21/2014 1352   ALKPHOS 138* 06/18/2014 1444   AST 18 10/21/2014 1352   AST 15 06/18/2014 1444   ALT 18 10/21/2014 1352   ALT 22 06/18/2014 1444   BILITOT 0.44 10/21/2014 1352   BILITOT 0.5 06/18/2014 1444       Lab Results  Component Value Date   WBC 7.1 10/21/2014   HGB 11.1* 10/21/2014   HCT 32.6* 10/21/2014   MCV 97.3 10/21/2014   PLT 212 10/21/2014   NEUTROABS 4.2 10/21/2014   ASSESSMENT & PLAN:  Breast cancer of upper-outer quadrant of right female breast Right breast invasive ductal carcinoma 3.6 cm by MRI with enlarged right axillary lymph node biopsy  proven to be positive for cancer , T2 N1 M0 stage IIB ER positive PR positive HER-2 negative Ki-67 22%  Current treatment:Dose dense Adriamycin and Cytoxan every 2 weeks 4 followed by Taxol weekly 12; Completed 4 cycles of AC; Abraxane in 6/12  Toxicities to chemotherapy: 1. Tiredness 2. Muscle aches and pains 3. 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 This has started even before giving Taxol. Because of this we switched her treatment with Abraxane. Patient is tolerating Abraxane much better and appears to not have any worsening neuropathy. 4. Severe hypertension: She was taking metoprolol 50 twice a day and we added lisinopril. 5. Loss of taste 6. Alopecia 7. Chemotherapy-induced anemia grade 1  8. Flulike symptoms 10/21/2014 with fatigue and upper respiratory infection: Prescribed azithromycin  Monitoring closely for chemotherapy toxicities  Return to clinic in 2 weeks and weekly for Abraxane    No orders of the defined types were placed in this encounter.   The patient has a good understanding of the overall plan. she agrees with it. She will call with any problems that may  develop before her next visit here.   Rulon Eisenmenger, MD

## 2014-10-21 NOTE — Telephone Encounter (Signed)
Pt aware ofAppointments  of 5/3 appointmentas

## 2014-10-21 NOTE — Assessment & Plan Note (Signed)
Right breast invasive ductal carcinoma 3.6 cm by MRI with enlarged right axillary lymph node biopsy proven to be positive for cancer , T2 N1 M0 stage IIB ER positive PR positive HER-2 negative Ki-67 22%  Current treatment:Dose dense Adriamycin and Cytoxan every 2 weeks 4 followed by Taxol weekly 12; Completed 4 cycles of AC; Abraxane in 6/12  Toxicities to chemotherapy: 1. Tiredness 2. Muscle aches and pains 3. 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 This has started even before giving Taxol. Because of this we switched her treatment with Abraxane. Patient is tolerating Abraxane much better and appears to not have any worsening neuropathy. 4. Severe hypertension: She was taking metoprolol 50 twice a day and we added lisinopril. 5. Loss of taste 6. Alopecia 7. Chemotherapy-induced anemia grade 1   Monitoring closely for chemotherapy toxicities  Return to clinic in 2 weeks and weekly for Abraxane

## 2014-10-28 ENCOUNTER — Ambulatory Visit (HOSPITAL_BASED_OUTPATIENT_CLINIC_OR_DEPARTMENT_OTHER): Payer: Medicaid Other

## 2014-10-28 ENCOUNTER — Other Ambulatory Visit (HOSPITAL_BASED_OUTPATIENT_CLINIC_OR_DEPARTMENT_OTHER): Payer: Medicaid Other

## 2014-10-28 VITALS — BP 147/87 | HR 105 | Temp 97.9°F | Resp 18

## 2014-10-28 DIAGNOSIS — Z5111 Encounter for antineoplastic chemotherapy: Secondary | ICD-10-CM

## 2014-10-28 DIAGNOSIS — C50411 Malignant neoplasm of upper-outer quadrant of right female breast: Secondary | ICD-10-CM

## 2014-10-28 DIAGNOSIS — C50911 Malignant neoplasm of unspecified site of right female breast: Secondary | ICD-10-CM

## 2014-10-28 LAB — CBC WITH DIFFERENTIAL/PLATELET
BASO%: 0.3 % (ref 0.0–2.0)
BASOS ABS: 0 10*3/uL (ref 0.0–0.1)
EOS%: 2.6 % (ref 0.0–7.0)
Eosinophils Absolute: 0.2 10*3/uL (ref 0.0–0.5)
HEMATOCRIT: 34.4 % — AB (ref 34.8–46.6)
HEMOGLOBIN: 11.7 g/dL (ref 11.6–15.9)
LYMPH%: 35.8 % (ref 14.0–49.7)
MCH: 33 pg (ref 25.1–34.0)
MCHC: 34 g/dL (ref 31.5–36.0)
MCV: 96.9 fL (ref 79.5–101.0)
MONO#: 0.4 10*3/uL (ref 0.1–0.9)
MONO%: 5.7 % (ref 0.0–14.0)
NEUT%: 55.6 % (ref 38.4–76.8)
NEUTROS ABS: 3.4 10*3/uL (ref 1.5–6.5)
Platelets: 219 10*3/uL (ref 145–400)
RBC: 3.55 10*6/uL — AB (ref 3.70–5.45)
RDW: 13.8 % (ref 11.2–14.5)
WBC: 6.2 10*3/uL (ref 3.9–10.3)
lymph#: 2.2 10*3/uL (ref 0.9–3.3)

## 2014-10-28 LAB — COMPREHENSIVE METABOLIC PANEL (CC13)
ALBUMIN: 3.4 g/dL — AB (ref 3.5–5.0)
ALK PHOS: 94 U/L (ref 40–150)
ALT: 14 U/L (ref 0–55)
AST: 16 U/L (ref 5–34)
Anion Gap: 14 mEq/L — ABNORMAL HIGH (ref 3–11)
BUN: 6.1 mg/dL — AB (ref 7.0–26.0)
CHLORIDE: 106 meq/L (ref 98–109)
CO2: 21 meq/L — AB (ref 22–29)
Calcium: 8.4 mg/dL (ref 8.4–10.4)
Creatinine: 0.8 mg/dL (ref 0.6–1.1)
EGFR: >90 mL/min/{1.73_m2} (ref >90)
GLUCOSE: 282 mg/dL — AB (ref 70–140)
Potassium: 3.5 mEq/L (ref 3.5–5.1)
Sodium: 142 mEq/L (ref 136–145)
TOTAL PROTEIN: 6.8 g/dL (ref 6.4–8.3)
Total Bilirubin: 0.48 mg/dL (ref 0.20–1.20)

## 2014-10-28 MED ORDER — SODIUM CHLORIDE 0.9 % IV SOLN
Freq: Once | INTRAVENOUS | Status: AC
  Administered 2014-10-28: 14:00:00 via INTRAVENOUS

## 2014-10-28 MED ORDER — PACLITAXEL PROTEIN-BOUND CHEMO INJECTION 100 MG
80.0000 mg/m2 | Freq: Once | INTRAVENOUS | Status: AC
  Administered 2014-10-28: 175 mg via INTRAVENOUS
  Filled 2014-10-28: qty 35

## 2014-10-28 MED ORDER — SODIUM CHLORIDE 0.9 % IJ SOLN
10.0000 mL | INTRAMUSCULAR | Status: DC | PRN
  Administered 2014-10-28: 10 mL
  Filled 2014-10-28: qty 10

## 2014-10-28 MED ORDER — HEPARIN SOD (PORK) LOCK FLUSH 100 UNIT/ML IV SOLN
500.0000 [IU] | Freq: Once | INTRAVENOUS | Status: AC | PRN
  Administered 2014-10-28: 500 [IU]
  Filled 2014-10-28: qty 5

## 2014-10-28 MED ORDER — SODIUM CHLORIDE 0.9 % IV SOLN
Freq: Once | INTRAVENOUS | Status: AC
  Administered 2014-10-28: 15:00:00 via INTRAVENOUS
  Filled 2014-10-28: qty 4

## 2014-10-28 NOTE — Patient Instructions (Signed)
[facility] Discharge Instructions for Patients Receiving Chemotherapy  Today you received the following chemotherapy agents Abraxane To help prevent nausea and vomiting after your treatment, we encourage you to take your nausea medication as prescribed.   If you develop nausea and vomiting that is not controlled by your nausea medication, call the clinic.   BELOW ARE SYMPTOMS THAT SHOULD BE REPORTED IMMEDIATELY:  *FEVER GREATER THAN 100.5 F  *CHILLS WITH OR WITHOUT FEVER  NAUSEA AND VOMITING THAT IS NOT CONTROLLED WITH YOUR NAUSEA MEDICATION  *UNUSUAL SHORTNESS OF BREATH  *UNUSUAL BRUISING OR BLEEDING  TENDERNESS IN MOUTH AND THROAT WITH OR WITHOUT PRESENCE OF ULCERS  *URINARY PROBLEMS  *BOWEL PROBLEMS  UNUSUAL RASH Items with * indicate a potential emergency and should be followed up as soon as possible.  Feel free to call the clinic you have any questions or concerns. The clinic phone number is [phone].  Please show the CHEMO ALERT CARD at check-in to the Emergency Department and triage nurse.   

## 2014-10-30 ENCOUNTER — Other Ambulatory Visit: Payer: Self-pay | Admitting: Hematology and Oncology

## 2014-10-31 ENCOUNTER — Other Ambulatory Visit: Payer: Self-pay | Admitting: *Deleted

## 2014-10-31 DIAGNOSIS — C50411 Malignant neoplasm of upper-outer quadrant of right female breast: Secondary | ICD-10-CM

## 2014-10-31 DIAGNOSIS — G620 Drug-induced polyneuropathy: Secondary | ICD-10-CM

## 2014-10-31 DIAGNOSIS — T451X5A Adverse effect of antineoplastic and immunosuppressive drugs, initial encounter: Secondary | ICD-10-CM

## 2014-10-31 MED ORDER — ONDANSETRON HCL 8 MG PO TABS: ORAL_TABLET | ORAL | Status: DC

## 2014-10-31 MED ORDER — LORAZEPAM 0.5 MG PO TABS: 0.5000 mg | ORAL_TABLET | Freq: Four times a day (QID) | ORAL | Status: DC | PRN

## 2014-10-31 MED ORDER — METOPROLOL TARTRATE 50 MG PO TABS: 50.0000 mg | ORAL_TABLET | Freq: Two times a day (BID) | ORAL | Status: DC

## 2014-11-04 ENCOUNTER — Ambulatory Visit (HOSPITAL_BASED_OUTPATIENT_CLINIC_OR_DEPARTMENT_OTHER): Payer: Medicaid Other | Admitting: Hematology and Oncology

## 2014-11-04 ENCOUNTER — Other Ambulatory Visit (HOSPITAL_BASED_OUTPATIENT_CLINIC_OR_DEPARTMENT_OTHER): Payer: Medicaid Other

## 2014-11-04 ENCOUNTER — Telehealth: Payer: Self-pay | Admitting: Hematology and Oncology

## 2014-11-04 ENCOUNTER — Ambulatory Visit (HOSPITAL_BASED_OUTPATIENT_CLINIC_OR_DEPARTMENT_OTHER): Payer: Medicaid Other

## 2014-11-04 ENCOUNTER — Other Ambulatory Visit: Payer: Medicaid Other

## 2014-11-04 VITALS — BP 171/71 | HR 82 | Temp 97.8°F | Resp 20 | Ht 65.0 in | Wt 242.9 lb

## 2014-11-04 DIAGNOSIS — M791 Myalgia: Secondary | ICD-10-CM

## 2014-11-04 DIAGNOSIS — D6481 Anemia due to antineoplastic chemotherapy: Secondary | ICD-10-CM | POA: Diagnosis not present

## 2014-11-04 DIAGNOSIS — C773 Secondary and unspecified malignant neoplasm of axilla and upper limb lymph nodes: Secondary | ICD-10-CM | POA: Diagnosis not present

## 2014-11-04 DIAGNOSIS — C50911 Malignant neoplasm of unspecified site of right female breast: Secondary | ICD-10-CM

## 2014-11-04 DIAGNOSIS — Z5111 Encounter for antineoplastic chemotherapy: Secondary | ICD-10-CM | POA: Diagnosis present

## 2014-11-04 DIAGNOSIS — R5383 Other fatigue: Secondary | ICD-10-CM

## 2014-11-04 DIAGNOSIS — C50411 Malignant neoplasm of upper-outer quadrant of right female breast: Secondary | ICD-10-CM

## 2014-11-04 DIAGNOSIS — G622 Polyneuropathy due to other toxic agents: Secondary | ICD-10-CM

## 2014-11-04 DIAGNOSIS — Z170 Estrogen receptor positive status [ER+]: Secondary | ICD-10-CM | POA: Diagnosis not present

## 2014-11-04 DIAGNOSIS — I10 Essential (primary) hypertension: Secondary | ICD-10-CM

## 2014-11-04 DIAGNOSIS — L658 Other specified nonscarring hair loss: Secondary | ICD-10-CM

## 2014-11-04 LAB — CBC WITH DIFFERENTIAL/PLATELET
BASO%: 1.3 % (ref 0.0–2.0)
Basophils Absolute: 0.1 10*3/uL (ref 0.0–0.1)
EOS ABS: 0.2 10*3/uL (ref 0.0–0.5)
EOS%: 2.8 % (ref 0.0–7.0)
HCT: 35.7 % (ref 34.8–46.6)
HGB: 11.7 g/dL (ref 11.6–15.9)
LYMPH%: 31.8 % (ref 14.0–49.7)
MCH: 32 pg (ref 25.1–34.0)
MCHC: 32.8 g/dL (ref 31.5–36.0)
MCV: 97.4 fL (ref 79.5–101.0)
MONO#: 0.3 10*3/uL (ref 0.1–0.9)
MONO%: 5.9 % (ref 0.0–14.0)
NEUT#: 3.4 10*3/uL (ref 1.5–6.5)
NEUT%: 58.2 % (ref 38.4–76.8)
PLATELETS: 252 10*3/uL (ref 145–400)
RBC: 3.66 10*6/uL — AB (ref 3.70–5.45)
RDW: 14.3 % (ref 11.2–14.5)
WBC: 5.9 10*3/uL (ref 3.9–10.3)
lymph#: 1.9 10*3/uL (ref 0.9–3.3)

## 2014-11-04 LAB — COMPREHENSIVE METABOLIC PANEL (CC13)
ALK PHOS: 87 U/L (ref 40–150)
ALT: 19 U/L (ref 0–55)
ANION GAP: 12 meq/L — AB (ref 3–11)
AST: 21 U/L (ref 5–34)
Albumin: 3.5 g/dL (ref 3.5–5.0)
BUN: 6.3 mg/dL — ABNORMAL LOW (ref 7.0–26.0)
CO2: 25 mEq/L (ref 22–29)
Calcium: 8.5 mg/dL (ref 8.4–10.4)
Chloride: 106 mEq/L (ref 98–109)
Creatinine: 0.8 mg/dL (ref 0.6–1.1)
EGFR: >90 mL/min/{1.73_m2} (ref >90)
Glucose: 250 mg/dl — ABNORMAL HIGH (ref 70–140)
Potassium: 3.2 mEq/L — ABNORMAL LOW (ref 3.5–5.1)
SODIUM: 143 meq/L (ref 136–145)
TOTAL PROTEIN: 6.8 g/dL (ref 6.4–8.3)
Total Bilirubin: 0.64 mg/dL (ref 0.20–1.20)

## 2014-11-04 MED ORDER — HEPARIN SOD (PORK) LOCK FLUSH 100 UNIT/ML IV SOLN
500.0000 [IU] | Freq: Once | INTRAVENOUS | Status: AC | PRN
  Administered 2014-11-04: 500 [IU]
  Filled 2014-11-04: qty 5

## 2014-11-04 MED ORDER — SODIUM CHLORIDE 0.9 % IV SOLN
Freq: Once | INTRAVENOUS | Status: AC
  Administered 2014-11-04: 15:00:00 via INTRAVENOUS
  Filled 2014-11-04: qty 4

## 2014-11-04 MED ORDER — PACLITAXEL PROTEIN-BOUND CHEMO INJECTION 100 MG
80.0000 mg/m2 | Freq: Once | INTRAVENOUS | Status: AC
Start: 2014-11-04 — End: 2014-11-04
  Administered 2014-11-04: 175 mg via INTRAVENOUS
  Filled 2014-11-04: qty 35

## 2014-11-04 MED ORDER — SODIUM CHLORIDE 0.9 % IV SOLN
Freq: Once | INTRAVENOUS | Status: AC
  Administered 2014-11-04: 15:00:00 via INTRAVENOUS

## 2014-11-04 MED ORDER — SODIUM CHLORIDE 0.9 % IJ SOLN
10.0000 mL | INTRAMUSCULAR | Status: DC | PRN
  Administered 2014-11-04: 10 mL
  Filled 2014-11-04: qty 10

## 2014-11-04 NOTE — Patient Instructions (Signed)
[facility] Discharge Instructions for Patients Receiving Chemotherapy  Today you received the following chemotherapy agents :  Abraxane.  To help prevent nausea and vomiting after your treatment, we encourage you to take your nausea medication as prescribed by your physician.   If you develop nausea and vomiting that is not controlled by your nausea medication, call the clinic.   BELOW ARE SYMPTOMS THAT SHOULD BE REPORTED IMMEDIATELY:  *FEVER GREATER THAN 100.5 F  *CHILLS WITH OR WITHOUT FEVER  NAUSEA AND VOMITING THAT IS NOT CONTROLLED WITH YOUR NAUSEA MEDICATION  *UNUSUAL SHORTNESS OF BREATH  *UNUSUAL BRUISING OR BLEEDING  TENDERNESS IN MOUTH AND THROAT WITH OR WITHOUT PRESENCE OF ULCERS  *URINARY PROBLEMS  *BOWEL PROBLEMS  UNUSUAL RASH Items with * indicate a potential emergency and should be followed up as soon as possible.  Feel free to call the clinic you have any questions or concerns. The clinic phone number is [phone].  Please show the CHEMO ALERT CARD at check-in to the Emergency Department and triage nurse.   

## 2014-11-04 NOTE — Assessment & Plan Note (Signed)
Right breast invasive ductal carcinoma 3.6 cm by MRI with enlarged right axillary lymph node biopsy proven to be positive for cancer , T2 N1 M0 stage IIB ER positive PR positive HER-2 negative Ki-67 22%  Current treatment:Dose dense Adriamycin and Cytoxan every 2 weeks 4 followed by Taxol weekly 12; Completed 4 cycles of AC; Abraxane in 8/12  Toxicities to chemotherapy: 1. Tiredness 2. Muscle aches and pains 3. 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 This has started even before giving Taxol. Because of this we switched her treatment with Abraxane. Patient is tolerating Abraxane much better and appears to not have any worsening neuropathy. 4. Severe hypertension: She was taking metoprolol 50 twice a day and we added lisinopril. 5. Loss of taste 6. Alopecia 7. Chemotherapy-induced anemia grade 1  8. Flulike symptoms 10/21/2014 with fatigue and upper respiratory infection: Prescribed azithromycin  Monitoring closely for chemotherapy toxicities  Return to clinic in 2 weeks and weekly for Abraxane

## 2014-11-04 NOTE — Progress Notes (Signed)
Patient Care Team: Cyndi Bender, PA-C as PCP - General (Physician Assistant)  DIAGNOSIS: No matching staging information was found for the patient.  SUMMARY OF ONCOLOGIC HISTORY:   Breast cancer of upper-outer quadrant of right female breast   05/27/2014 Mammogram Right breast mass by mammogram 5.4 cm by ultrasound 4 x 4 X 2 cm   05/28/2014 Initial Biopsy Right breast biopsy 11:30: Invasive ductal carcinoma with lymphovascular invasion grade 1/2, ER 100%, PR 97%, Ki-67 22%, HER-2 negative ratio 1.26 average copy #1.7, lymph node biopsy also positive   06/16/2014 Breast MRI right breast cancer: 3.6 cm at 12:00 position mildly enlarged right axillary lymph node: 5 mm oval enhancing mass in the central left breast could be an intramammary lymph node   07/10/2014 -  Neo-Adjuvant Chemotherapy Dose dense Adriamycin and Cytoxan 4 followed by Taxol 12   07/10/2014 Procedure PREVENT clinical trial (Lipitor vs Placebo) in patients undergoing cardiotoxic chemotherapy    CHIEF COMPLIANT: week 8/12 Abraxane  INTERVAL HISTORY: Marissa Haney is a 59 year old lady with above-mentioned history of right breast cancer neoadjuvant chemotherapy and is currently on Abraxane weekly. She is receiving week 8 of her treatment. She is tolerating it extremely well without any major problems. She denies any nausea or vomiting. She has decent appetite although the taste is not great. Good energy levels. Denies any tingling or numbness of her hands or feet.  REVIEW OF SYSTEMS:   Constitutional: Denies fevers, chills or abnormal weight loss Eyes: Denies blurriness of vision Ears, nose, mouth, throat, and face: Denies mucositis or sore throat Respiratory: Denies cough, dyspnea or wheezes Cardiovascular: Denies palpitation, chest discomfort or lower extremity swelling Gastrointestinal:  Denies nausea, heartburn or change in bowel habits Skin: Denies abnormal skin rashes Lymphatics: Denies new lymphadenopathy or easy  bruising Neurological:Denies numbness, tingling or new weaknesses Behavioral/Psych: Mood is stable, no new changes  Breast:  denies any pain or lumps or nodules in either breasts All other systems were reviewed with the patient and are negative.  I have reviewed the past medical history, past surgical history, social history and family history with the patient and they are unchanged from previous note.  ALLERGIES:  has No Known Allergies.  MEDICATIONS:  Current Outpatient Prescriptions  Medication Sig Dispense Refill  . ALPRAZolam (XANAX) 0.25 MG tablet Take 1 tablet (0.25 mg total) by mouth 2 (two) times daily as needed for anxiety. 30 tablet 0  . glipiZIDE (GLUCOTROL) 5 MG tablet Take 5 mg by mouth 2 (two) times daily before a meal.    . lisinopril (PRINIVIL,ZESTRIL) 10 MG tablet Take 1 tablet (10 mg total) by mouth daily. 30 tablet 3  . LORazepam (ATIVAN) 0.5 MG tablet Take 1 tablet (0.5 mg total) by mouth every 6 (six) hours as needed (Nausea or vomiting). 30 tablet 0  . meclizine (ANTIVERT) 25 MG tablet Take 1 tablet (25 mg total) by mouth 2 (two) times daily. 60 tablet 3  . metFORMIN (GLUCOPHAGE) 500 MG tablet Take 500 mg by mouth.    . metoprolol (LOPRESSOR) 50 MG tablet Take 1 tablet (50 mg total) by mouth 2 (two) times daily. 60 tablet 1  . ondansetron (ZOFRAN) 8 MG tablet TAKE 1 TABLET BY MOUTH 2 TIMES DAILY AS NEEDED. START ON THE THIRD DAY AFTER CHEMOTHERAPY. 30 tablet 1   No current facility-administered medications for this visit.    PHYSICAL EXAMINATION: ECOG PERFORMANCE STATUS: 1 - Symptomatic but completely ambulatory  Filed Vitals:   11/04/14 1358  BP: 171/71  Pulse: 82  Temp: 97.8 F (36.6 C)  Resp: 20   Filed Weights   11/04/14 1358  Weight: 242 lb 14.4 oz (110.179 kg)    GENERAL:alert, no distress and comfortable SKIN: skin color, texture, turgor are normal, no rashes or significant lesions EYES: normal, Conjunctiva are pink and non-injected, sclera  clear OROPHARYNX:no exudate, no erythema and lips, buccal mucosa, and tongue normal  NECK: supple, thyroid normal size, non-tender, without nodularity LYMPH:  no palpable lymphadenopathy in the cervical, axillary or inguinal LUNGS: clear to auscultation and percussion with normal breathing effort HEART: regular rate & rhythm and no murmurs and no lower extremity edema ABDOMEN:abdomen soft, non-tender and normal bowel sounds Musculoskeletal:no cyanosis of digits and no clubbing  NEURO: alert & oriented x 3 with fluent speech, no focal motor/sensory deficits  LABORATORY DATA:  I have reviewed the data as listed   Chemistry      Component Value Date/Time   NA 143 11/04/2014 1341   NA 143 06/18/2014 1444   K 3.2* 11/04/2014 1341   K 3.6* 06/18/2014 1444   CL 105 06/18/2014 1444   CO2 25 11/04/2014 1341   CO2 26 06/18/2014 1444   BUN 6.3* 11/04/2014 1341   BUN 11 06/18/2014 1444   CREATININE 0.8 11/04/2014 1341   CREATININE 0.67 06/18/2014 1444      Component Value Date/Time   CALCIUM 8.5 11/04/2014 1341   CALCIUM 9.0 06/18/2014 1444   ALKPHOS 87 11/04/2014 1341   ALKPHOS 138* 06/18/2014 1444   AST 21 11/04/2014 1341   AST 15 06/18/2014 1444   ALT 19 11/04/2014 1341   ALT 22 06/18/2014 1444   BILITOT 0.64 11/04/2014 1341   BILITOT 0.5 06/18/2014 1444       Lab Results  Component Value Date   WBC 5.9 11/04/2014   HGB 11.7 11/04/2014   HCT 35.7 11/04/2014   MCV 97.4 11/04/2014   PLT 252 11/04/2014   NEUTROABS 3.4 11/04/2014    ASSESSMENT & PLAN:  Breast cancer of upper-outer quadrant of right female breast Right breast invasive ductal carcinoma 3.6 cm by MRI with enlarged right axillary lymph node biopsy proven to be positive for cancer , T2 N1 M0 stage IIB ER positive PR positive HER-2 negative Ki-67 22%  Current treatment:Dose dense Adriamycin and Cytoxan every 2 weeks 4 followed by Abraxane weekly 12; Completed 4 cycles of AC; Abraxane in 8/12  Toxicities to  chemotherapy: 1. Tiredness 2. Muscle aches and pains 3. 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 This has started even before giving Taxol. Because of this we switched her treatment with Abraxane. Patient is tolerating Abraxane much better and appears to not have any worsening neuropathy. 4. Severe hypertension: She was taking metoprolol 50 twice a day and we added lisinopril. 5. Loss of taste 6. Alopecia 7. Chemotherapy-induced anemia grade 1  8. Flulike symptoms 10/21/2014 with fatigue and upper respiratory infection: Prescribed azithromycin  Monitoring closely for chemotherapy toxicities  Return to clinic in 2 weeks and weekly for Abraxane    No orders of the defined types were placed in this encounter.   The patient has a good understanding of the overall plan. she agrees with it. she will call with any problems that may develop before the next visit here.   Rulon Eisenmenger, MD   .

## 2014-11-04 NOTE — Telephone Encounter (Signed)
appointments added per pof and patient will get a new schedule in chemo

## 2014-11-11 ENCOUNTER — Ambulatory Visit (HOSPITAL_BASED_OUTPATIENT_CLINIC_OR_DEPARTMENT_OTHER): Payer: Medicaid Other

## 2014-11-11 ENCOUNTER — Other Ambulatory Visit (HOSPITAL_BASED_OUTPATIENT_CLINIC_OR_DEPARTMENT_OTHER): Payer: Medicaid Other

## 2014-11-11 VITALS — BP 177/53 | HR 57 | Temp 97.8°F | Resp 18

## 2014-11-11 DIAGNOSIS — C50411 Malignant neoplasm of upper-outer quadrant of right female breast: Secondary | ICD-10-CM

## 2014-11-11 DIAGNOSIS — C773 Secondary and unspecified malignant neoplasm of axilla and upper limb lymph nodes: Secondary | ICD-10-CM | POA: Diagnosis not present

## 2014-11-11 DIAGNOSIS — Z5111 Encounter for antineoplastic chemotherapy: Secondary | ICD-10-CM

## 2014-11-11 DIAGNOSIS — C50911 Malignant neoplasm of unspecified site of right female breast: Secondary | ICD-10-CM

## 2014-11-11 LAB — CBC WITH DIFFERENTIAL/PLATELET
BASO%: 1.3 % (ref 0.0–2.0)
Basophils Absolute: 0.1 10*3/uL (ref 0.0–0.1)
EOS%: 2.4 % (ref 0.0–7.0)
Eosinophils Absolute: 0.2 10*3/uL (ref 0.0–0.5)
HCT: 33.7 % — ABNORMAL LOW (ref 34.8–46.6)
HGB: 11.4 g/dL — ABNORMAL LOW (ref 11.6–15.9)
LYMPH#: 2.4 10*3/uL (ref 0.9–3.3)
LYMPH%: 35.6 % (ref 14.0–49.7)
MCH: 33 pg (ref 25.1–34.0)
MCHC: 33.9 g/dL (ref 31.5–36.0)
MCV: 97.3 fL (ref 79.5–101.0)
MONO#: 0.4 10*3/uL (ref 0.1–0.9)
MONO%: 6 % (ref 0.0–14.0)
NEUT#: 3.7 10*3/uL (ref 1.5–6.5)
NEUT%: 54.7 % (ref 38.4–76.8)
PLATELETS: 248 10*3/uL (ref 145–400)
RBC: 3.46 10*6/uL — AB (ref 3.70–5.45)
RDW: 14.4 % (ref 11.2–14.5)
WBC: 6.7 10*3/uL (ref 3.9–10.3)

## 2014-11-11 LAB — COMPREHENSIVE METABOLIC PANEL (CC13)
ALK PHOS: 99 U/L (ref 40–150)
ALT: 12 U/L (ref 0–55)
ANION GAP: 9 meq/L (ref 3–11)
AST: 16 U/L (ref 5–34)
Albumin: 3.3 g/dL — ABNORMAL LOW (ref 3.5–5.0)
BILIRUBIN TOTAL: 0.65 mg/dL (ref 0.20–1.20)
BUN: 8 mg/dL (ref 7.0–26.0)
CHLORIDE: 107 meq/L (ref 98–109)
CO2: 27 meq/L (ref 22–29)
CREATININE: 0.9 mg/dL (ref 0.6–1.1)
Calcium: 8.3 mg/dL — ABNORMAL LOW (ref 8.4–10.4)
EGFR: 86 mL/min/{1.73_m2} — AB (ref >90)
Glucose: 205 mg/dl — ABNORMAL HIGH (ref 70–140)
Potassium: 3.3 mEq/L — ABNORMAL LOW (ref 3.5–5.1)
Sodium: 143 mEq/L (ref 136–145)
TOTAL PROTEIN: 6.6 g/dL (ref 6.4–8.3)

## 2014-11-11 MED ORDER — SODIUM CHLORIDE 0.9 % IV SOLN
Freq: Once | INTRAVENOUS | Status: AC
  Administered 2014-11-11: 15:00:00 via INTRAVENOUS
  Filled 2014-11-11: qty 4

## 2014-11-11 MED ORDER — HEPARIN SOD (PORK) LOCK FLUSH 100 UNIT/ML IV SOLN
500.0000 [IU] | Freq: Once | INTRAVENOUS | Status: AC | PRN
  Administered 2014-11-11: 500 [IU]
  Filled 2014-11-11: qty 5

## 2014-11-11 MED ORDER — SODIUM CHLORIDE 0.9 % IJ SOLN
10.0000 mL | INTRAMUSCULAR | Status: DC | PRN
  Administered 2014-11-11: 10 mL
  Filled 2014-11-11: qty 10

## 2014-11-11 MED ORDER — SODIUM CHLORIDE 0.9 % IV SOLN
Freq: Once | INTRAVENOUS | Status: AC
  Administered 2014-11-11: 15:00:00 via INTRAVENOUS

## 2014-11-11 MED ORDER — PACLITAXEL PROTEIN-BOUND CHEMO INJECTION 100 MG
80.0000 mg/m2 | Freq: Once | INTRAVENOUS | Status: AC
  Administered 2014-11-11: 175 mg via INTRAVENOUS
  Filled 2014-11-11: qty 35

## 2014-11-11 NOTE — Patient Instructions (Signed)
[facility] Discharge Instructions for Patients Receiving Chemotherapy  Today you received the following chemotherapy agents :  Abraxane.  To help prevent nausea and vomiting after your treatment, we encourage you to take your nausea medication as prescribed by your physician.   If you develop nausea and vomiting that is not controlled by your nausea medication, call the clinic.   BELOW ARE SYMPTOMS THAT SHOULD BE REPORTED IMMEDIATELY:  *FEVER GREATER THAN 100.5 F  *CHILLS WITH OR WITHOUT FEVER  NAUSEA AND VOMITING THAT IS NOT CONTROLLED WITH YOUR NAUSEA MEDICATION  *UNUSUAL SHORTNESS OF BREATH  *UNUSUAL BRUISING OR BLEEDING  TENDERNESS IN MOUTH AND THROAT WITH OR WITHOUT PRESENCE OF ULCERS  *URINARY PROBLEMS  *BOWEL PROBLEMS  UNUSUAL RASH Items with * indicate a potential emergency and should be followed up as soon as possible.  Feel free to call the clinic you have any questions or concerns. The clinic phone number is [phone].  Please show the CHEMO ALERT CARD at check-in to the Emergency Department and triage nurse.   

## 2014-11-12 ENCOUNTER — Encounter: Payer: Self-pay | Admitting: *Deleted

## 2014-11-12 NOTE — Progress Notes (Signed)
[place] Work  Clinical Social Work was referred by patient for assessment of psychosocial needs.  Clinical Social Worker returned patient's call to offer support and assess for needs.  Pt was trying to find out if her utility bills had been paid through the grant program. CSW directed her to reach out to [employer] and provided contact. Pt denied other concerns currently. Pt aware to reach out as needed.   Loren Racer, [place] Worker [place]  [facility] Phone: [phone] Fax: [phone]

## 2014-11-17 NOTE — Assessment & Plan Note (Signed)
Right breast invasive ductal carcinoma 3.6 cm by MRI with enlarged right axillary lymph node biopsy proven to be positive for cancer , T2 N1 M0 stage IIB ER positive PR positive HER-2 negative Ki-67 22%  Current treatment:Dose dense Adriamycin and Cytoxan every 2 weeks 4 followed by Taxol weekly 12; Completed 4 cycles of AC; Abraxane in 10/12  Toxicities to chemotherapy: 1. Tiredness 2. Muscle aches and pains 3. 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 This has started even before giving Taxol. Because of this we switched her treatment with Abraxane. Patient is tolerating Abraxane much better and appears to not have any worsening neuropathy. 4. Severe hypertension: She was taking metoprolol 50 twice a day and we added lisinopril. 5. Loss of taste 6. Alopecia 7. Chemotherapy-induced anemia grade 1  8. Flulike symptoms 10/21/2014 with fatigue and upper respiratory infection: Prescribed azithromycin  Monitoring closely for chemotherapy toxicities  Return to clinic in 2 weeks and weekly for Abraxane

## 2014-11-18 ENCOUNTER — Other Ambulatory Visit: Payer: Medicaid Other

## 2014-11-18 ENCOUNTER — Telehealth: Payer: Self-pay | Admitting: Hematology and Oncology

## 2014-11-18 ENCOUNTER — Other Ambulatory Visit (HOSPITAL_BASED_OUTPATIENT_CLINIC_OR_DEPARTMENT_OTHER): Payer: Medicaid Other

## 2014-11-18 ENCOUNTER — Ambulatory Visit: Payer: Medicaid Other

## 2014-11-18 ENCOUNTER — Ambulatory Visit (HOSPITAL_BASED_OUTPATIENT_CLINIC_OR_DEPARTMENT_OTHER): Payer: Medicaid Other | Admitting: Hematology and Oncology

## 2014-11-18 VITALS — BP 168/67 | HR 62 | Temp 97.7°F | Resp 19 | Ht 65.0 in | Wt 239.7 lb

## 2014-11-18 DIAGNOSIS — I10 Essential (primary) hypertension: Secondary | ICD-10-CM | POA: Diagnosis not present

## 2014-11-18 DIAGNOSIS — C773 Secondary and unspecified malignant neoplasm of axilla and upper limb lymph nodes: Secondary | ICD-10-CM

## 2014-11-18 DIAGNOSIS — G620 Drug-induced polyneuropathy: Secondary | ICD-10-CM

## 2014-11-18 DIAGNOSIS — R439 Unspecified disturbances of smell and taste: Secondary | ICD-10-CM | POA: Diagnosis not present

## 2014-11-18 DIAGNOSIS — R530 Neoplastic (malignant) related fatigue: Secondary | ICD-10-CM | POA: Diagnosis not present

## 2014-11-18 DIAGNOSIS — Z170 Estrogen receptor positive status [ER+]: Secondary | ICD-10-CM | POA: Diagnosis not present

## 2014-11-18 DIAGNOSIS — C50411 Malignant neoplasm of upper-outer quadrant of right female breast: Secondary | ICD-10-CM | POA: Diagnosis present

## 2014-11-18 DIAGNOSIS — D6481 Anemia due to antineoplastic chemotherapy: Secondary | ICD-10-CM | POA: Diagnosis not present

## 2014-11-18 DIAGNOSIS — C50911 Malignant neoplasm of unspecified site of right female breast: Secondary | ICD-10-CM

## 2014-11-18 LAB — COMPREHENSIVE METABOLIC PANEL (CC13)
ALT: 15 U/L (ref 0–55)
AST: 16 U/L (ref 5–34)
Albumin: 3.5 g/dL (ref 3.5–5.0)
Alkaline Phosphatase: 94 U/L (ref 40–150)
Anion Gap: 11 mEq/L (ref 3–11)
BUN: 9 mg/dL (ref 7.0–26.0)
CO2: 26 meq/L (ref 22–29)
CREATININE: 0.9 mg/dL (ref 0.6–1.1)
Calcium: 9.1 mg/dL (ref 8.4–10.4)
Chloride: 105 mEq/L (ref 98–109)
EGFR: 77 mL/min/{1.73_m2} — ABNORMAL LOW (ref >90)
GLUCOSE: 229 mg/dL — AB (ref 70–140)
Potassium: 3.5 mEq/L (ref 3.5–5.1)
Sodium: 142 mEq/L (ref 136–145)
Total Bilirubin: 0.57 mg/dL (ref 0.20–1.20)
Total Protein: 7 g/dL (ref 6.4–8.3)

## 2014-11-18 LAB — CBC WITH DIFFERENTIAL/PLATELET
BASO%: 0.5 % (ref 0.0–2.0)
Basophils Absolute: 0 10*3/uL (ref 0.0–0.1)
EOS ABS: 0.1 10*3/uL (ref 0.0–0.5)
EOS%: 2.2 % (ref 0.0–7.0)
HEMATOCRIT: 36.7 % (ref 34.8–46.6)
HGB: 12.3 g/dL (ref 11.6–15.9)
LYMPH%: 34.6 % (ref 14.0–49.7)
MCH: 32.3 pg (ref 25.1–34.0)
MCHC: 33.5 g/dL (ref 31.5–36.0)
MCV: 96.3 fL (ref 79.5–101.0)
MONO#: 0.4 10*3/uL (ref 0.1–0.9)
MONO%: 6 % (ref 0.0–14.0)
NEUT%: 56.7 % (ref 38.4–76.8)
NEUTROS ABS: 3.4 10*3/uL (ref 1.5–6.5)
Platelets: 265 10*3/uL (ref 145–400)
RBC: 3.81 10*6/uL (ref 3.70–5.45)
RDW: 14 % (ref 11.2–14.5)
WBC: 6 10*3/uL (ref 3.9–10.3)
lymph#: 2.1 10*3/uL (ref 0.9–3.3)

## 2014-11-18 NOTE — Progress Notes (Signed)
Patient Care Team: Cyndi Bender, PA-C as PCP - General (Physician Assistant)  DIAGNOSIS: No matching staging information was found for the patient.  SUMMARY OF ONCOLOGIC HISTORY:   Breast cancer of upper-outer quadrant of right female breast   05/27/2014 Mammogram Right breast mass by mammogram 5.4 cm by ultrasound 4 x 4 X 2 cm   05/28/2014 Initial Biopsy Right breast biopsy 11:30: Invasive ductal carcinoma with lymphovascular invasion grade 1/2, ER 100%, PR 97%, Ki-67 22%, HER-2 negative ratio 1.26 average copy #1.7, lymph node biopsy also positive   06/16/2014 Breast MRI right breast cancer: 3.6 cm at 12:00 position mildly enlarged right axillary lymph node: 5 mm oval enhancing mass in the central left breast could be an intramammary lymph node   07/10/2014 -  Neo-Adjuvant Chemotherapy Dose dense Adriamycin and Cytoxan 4 followed by Abraxane 12   07/10/2014 Procedure PREVENT clinical trial (Lipitor vs Placebo) in patients undergoing cardiotoxic chemotherapy    CHIEF COMPLIANT: Progressive weakness, neuropathy, decreased appetite  INTERVAL HISTORY: Marissa Haney is a 59 year old with above-mentioned history of right-sided breast cancer currently on neoadjuvant chemotherapy. She is here today for cycle 10 of Abraxane but she feels extremely fatigued and weak tired and also with neuropathy symptoms. She is keen on stopping chemotherapy today.  REVIEW OF SYSTEMS:   Constitutional: Denies fevers, chills or abnormal weight loss Eyes: Denies blurriness of vision Ears, nose, mouth, throat, and face: Denies mucositis or sore throat Respiratory: Denies cough, dyspnea or wheezes Cardiovascular: Denies palpitation, chest discomfort or lower extremity swelling Gastrointestinal:  Denies nausea, heartburn or change in bowel habits Skin: Denies abnormal skin rashes Lymphatics: Denies new lymphadenopathy or easy bruising Neurological:Denies numbness, tingling or new weaknesses Behavioral/Psych: Mood  is stable, no new changes  Breast: Breast lump markedly smaller All other systems were reviewed with the patient and are negative.  I have reviewed the past medical history, past surgical history, social history and family history with the patient and they are unchanged from previous note.  ALLERGIES:  has No Known Allergies.  MEDICATIONS:  Current Outpatient Prescriptions  Medication Sig Dispense Refill  . ALPRAZolam (XANAX) 0.25 MG tablet Take 1 tablet (0.25 mg total) by mouth 2 (two) times daily as needed for anxiety. 30 tablet 0  . glipiZIDE (GLUCOTROL) 5 MG tablet Take 5 mg by mouth 2 (two) times daily before a meal.    . lisinopril (PRINIVIL,ZESTRIL) 10 MG tablet Take 1 tablet (10 mg total) by mouth daily. 30 tablet 3  . LORazepam (ATIVAN) 0.5 MG tablet Take 1 tablet (0.5 mg total) by mouth every 6 (six) hours as needed (Nausea or vomiting). 30 tablet 0  . meclizine (ANTIVERT) 25 MG tablet Take 1 tablet (25 mg total) by mouth 2 (two) times daily. 60 tablet 3  . metFORMIN (GLUCOPHAGE) 500 MG tablet Take 500 mg by mouth.    . metoprolol (LOPRESSOR) 50 MG tablet Take 1 tablet (50 mg total) by mouth 2 (two) times daily. 60 tablet 1  . ondansetron (ZOFRAN) 8 MG tablet TAKE 1 TABLET BY MOUTH 2 TIMES DAILY AS NEEDED. START ON THE THIRD DAY AFTER CHEMOTHERAPY. 30 tablet 1   No current facility-administered medications for this visit.    PHYSICAL EXAMINATION: ECOG PERFORMANCE STATUS: 1 - Symptomatic but completely ambulatory  Filed Vitals:   11/18/14 1423  BP: 168/67  Pulse: 62  Temp: 97.7 F (36.5 C)  Resp: 19   Filed Weights   11/18/14 1423  Weight: 239 lb 11.2 oz (108.727 kg)  GENERAL:alert, no distress and comfortable SKIN: skin color, texture, turgor are normal, no rashes or significant lesions EYES: normal, Conjunctiva are pink and non-injected, sclera clear OROPHARYNX:no exudate, no erythema and lips, buccal mucosa, and tongue normal  NECK: supple, thyroid normal  size, non-tender, without nodularity LYMPH:  no palpable lymphadenopathy in the cervical, axillary or inguinal LUNGS: clear to auscultation and percussion with normal breathing effort HEART: regular rate & rhythm and no murmurs and no lower extremity edema ABDOMEN:abdomen soft, non-tender and normal bowel sounds Musculoskeletal:no cyanosis of digits and no clubbing  NEURO: alert & oriented x 3 with fluent speech, no focal motor/sensory deficits   LABORATORY DATA:  I have reviewed the data as listed   Chemistry      Component Value Date/Time   NA 142 11/18/2014 1406   NA 143 06/18/2014 1444   K 3.5 11/18/2014 1406   K 3.6* 06/18/2014 1444   CL 105 06/18/2014 1444   CO2 26 11/18/2014 1406   CO2 26 06/18/2014 1444   BUN 9.0 11/18/2014 1406   BUN 11 06/18/2014 1444   CREATININE 0.9 11/18/2014 1406   CREATININE 0.67 06/18/2014 1444      Component Value Date/Time   CALCIUM 9.1 11/18/2014 1406   CALCIUM 9.0 06/18/2014 1444   ALKPHOS 94 11/18/2014 1406   ALKPHOS 138* 06/18/2014 1444   AST 16 11/18/2014 1406   AST 15 06/18/2014 1444   ALT 15 11/18/2014 1406   ALT 22 06/18/2014 1444   BILITOT 0.57 11/18/2014 1406   BILITOT 0.5 06/18/2014 1444       Lab Results  Component Value Date   WBC 6.0 11/18/2014   HGB 12.3 11/18/2014   HCT 36.7 11/18/2014   MCV 96.3 11/18/2014   PLT 265 11/18/2014   NEUTROABS 3.4 11/18/2014   ASSESSMENT & PLAN:  Breast cancer of upper-outer quadrant of right female breast Right breast invasive ductal carcinoma 3.6 cm by MRI with enlarged right axillary lymph node biopsy proven to be positive for cancer , T2 N1 M0 stage IIB ER positive PR positive HER-2 negative Ki-67 22%  Chemotherapy summary: Started 07/10/2014 Dose dense Adriamycin and Cytoxan every 2 weeks 4 followed by Taxol weekly 12; Completed 4 cycles of AC; Abraxane completed 9/12 discontinued for toxicities 11/18/2014  Toxicities to chemotherapy:Tiredness, myalgias, neuropathy grade 2,  hypertension, loss of taste, alopecia, anemia grade 1, flulike symptoms  Patient's toxicities have become cumulative with profound progressive fatigue, neuropathy, decreased appetite, decreased energy. We decided to discontinue chemotherapy. Patient will undergo MRI of the breast May 27 and was presented at tumor board June 1.  Return to clinic June 1 to discuss breast MRI result I sent a message to Dr. Dalbert Batman to follow her after breast MRI.   Orders Placed This Encounter  Procedures  . MR Breast Bilateral W Wo Contrast    Wt-239/claus/right axillary lymph node removal/no pacemaker or stents/pt has 3 mm metal fragment in eye (had MRI since then with no problems)/no implanted devices or brain aneurysm clips/hx of breast cancer/no kidney or liver dz/no lupus,ra,or sclero/pt has hpt and diabetes/no aspirin,nsaids or blood thinners/nkda to iv/no needs/ins-mcd/np/pt with epic order PF MAMMO 06/2014 [facility] (PT TO REQ)    Standing Status: Future     Number of Occurrences:      Standing Expiration Date: 01/18/2016    Order Specific Question:  Reason for Exam (SYMPTOM  OR DIAGNOSIS REQUIRED)    Answer:  Post neoadjuvant chemo    Order Specific Question:  Preferred imaging location?    Answer:  [id number]    Order Specific Question:  Does the patient have a pacemaker or implanted devices?    Answer:  No    Order Specific Question:  What is the patient's sedation requirement?    Answer:  No Sedation   The patient has a good understanding of the overall plan. she agrees with it. she will call with any problems that may develop before the next visit here.   Rulon Eisenmenger, MD

## 2014-11-18 NOTE — Telephone Encounter (Signed)
Gave avs & calendar for June. MRI at [facility] next available is 06/01 moved MD appointment to 06/06.

## 2014-11-20 ENCOUNTER — Telehealth: Payer: Self-pay | Admitting: *Deleted

## 2014-11-20 NOTE — Telephone Encounter (Signed)
Called patient in reference to call from oncall service. Advised patient that these symptoms are a result of the chemotherapy and it will take time for them to resolve. Patient c/o numbness in her fingers that is making it hard to pick up things. Discussed with Dr. Lindi Adie and patient advised to give it 2-3 weeks for symptoms to resolve. If no improvement at that time then to call us back. Patient does have f/u scheduled for 6/6. She verbalized understanding.

## 2014-11-20 NOTE — Telephone Encounter (Signed)
Received telephone advice record, sent to scan. 

## 2014-12-02 ENCOUNTER — Ambulatory Visit: Payer: Medicaid Other | Admitting: Hematology and Oncology

## 2014-12-02 ENCOUNTER — Telehealth: Payer: Self-pay | Admitting: *Deleted

## 2014-12-02 ENCOUNTER — Telehealth: Payer: Self-pay | Admitting: Radiology

## 2014-12-02 NOTE — Telephone Encounter (Signed)
Let pt know I would check on reason for cancellation and call her tomorrow.  Pt voiced understanding.

## 2014-12-02 NOTE — Telephone Encounter (Signed)
"  I received a call that the MRI scheduled for tomorrow was cancelled.  Because Insurance denied it.  I don't understand.  Please call."  Will route message to provider.

## 2014-12-03 ENCOUNTER — Ambulatory Visit (HOSPITAL_COMMUNITY): Admission: RE | Admit: 2014-12-03 | Payer: Medicaid Other | Source: Ambulatory Visit

## 2014-12-03 ENCOUNTER — Telehealth: Payer: Self-pay

## 2014-12-03 NOTE — Telephone Encounter (Signed)
Additional information provided to [facility] clinical staff for MRI.  Sent to review - will be provided response no later than close of business 6/2.  [facility] confirmed that a peer to peer can be requested if review denies scan.  30+ minute phone call.

## 2014-12-04 ENCOUNTER — Telehealth: Payer: Self-pay

## 2014-12-04 ENCOUNTER — Telehealth: Payer: Self-pay | Admitting: Hematology and Oncology

## 2014-12-04 NOTE — Telephone Encounter (Signed)
Called [facility].  MRI has been approved 11/25/14-12/25/14.  Auth No [id number].  Routed to managed care

## 2014-12-04 NOTE — Telephone Encounter (Signed)
Returned patient call re 6/6 appointments. Patient inquiring as to why this appointments was scheduled due to she is still waiting on mri appointment. Mri appointment was just scheduled for today for 6/15 @ 8:30 am at [facility]. Info re mri appointment was given to patient along with number for [facility] to call to confirm. F/u for 6/6 moved to 6/20 after 6/15 mri. Patient has new d/t.

## 2014-12-05 ENCOUNTER — Telehealth: Payer: Self-pay

## 2014-12-05 NOTE — Telephone Encounter (Signed)
Confirmed d/t for MRI - pt voiced understanding.

## 2014-12-08 ENCOUNTER — Ambulatory Visit: Payer: Medicaid Other | Admitting: Hematology and Oncology

## 2014-12-08 ENCOUNTER — Ambulatory Visit
Admission: RE | Admit: 2014-12-08 | Discharge: 2014-12-08 | Disposition: A | Discharge status: Home / Self Care | Payer: Medicaid Other | Source: Ambulatory Visit | Attending: Hematology and Oncology | Admitting: Hematology and Oncology

## 2014-12-08 DIAGNOSIS — C50411 Malignant neoplasm of upper-outer quadrant of right female breast: Secondary | ICD-10-CM

## 2014-12-08 MED ORDER — GADOBENATE DIMEGLUMINE 529 MG/ML IV SOLN
20.0000 mL | Freq: Once | INTRAVENOUS | Status: AC | PRN
  Administered 2014-12-08: 20 mL via INTRAVENOUS

## 2014-12-09 ENCOUNTER — Other Ambulatory Visit: Payer: Medicaid Other

## 2014-12-17 ENCOUNTER — Other Ambulatory Visit: Payer: Medicaid Other

## 2014-12-18 ENCOUNTER — Encounter: Payer: Self-pay | Admitting: Radiation Oncology

## 2014-12-18 NOTE — Progress Notes (Signed)
Location of Breast Cancer:Right Breast  T2 N1 MO stage II B (mass 1200  position Upper Outer Quadrant)  Histology per Pathology Report: She underwent a biopsy 05/28/2014 : Right Breast needle core bx ,1130=Invasive Ductal Carcinoma,Lymphovascular invasion is identified,2. Lymph node,needle/core bx,axillary=Metastatic Carcinoma  She underwent a breast MRI in 06/16/2014 that revealed 3.6 cm mass in the right breast with enlarged right axillary lymph node in addition there was a 5 mm oval enhancing mass in the central left breast thought to be an intramammary lymph node.   Receptor Status: ER(+100%), PR (+97%), Her2-neu (neg KI-67 22%)  Did patient present with symptoms (if so, please note symptoms) or was this found on screening mammography?: patient found lump on breast ,initially treated with antibiotic,then sent for mammogram   Past/Anticipated interventions by surgeon, if any:  Dr. Fanny Skates, MD,Hx t excisional bx of right axillary lymph node many tears ago=benign, sees Dr. Renelda Loma Ingram,MD 12/23/14 @ 2pm,  Past/Anticipated interventions by medical oncology, if any: Chemotherapy :Dr. Lindi Adie, seen today 12/22/14  06/16/2014 Breast MRI right breast cancer: 3.6 cm at 12:00 position mildly enlarged right axillary lymph node: 5 mm oval enhancing mass in the central left breast could be an intramammary lymph node   07/10/2014 -  Neo-Adjuvant Chemotherapy Dose dense Adriamycin and Cytoxan 4 followed by Abraxane 12   07/10/2014 Procedure PREVENT clinical trial (Lipitor vs Placebo) in patients undergoing cardiotoxic chemotherapy         Lymphedema issues, if any:  none Pain issues, if any:  none  SAFETY ISSUES:no  Prior radiation? NO  Pacemaker/ICD? NO  Possible current pregnancy? NO  Is the patient on methotrexate?NO  Current Complaints / other details: Single,  Menrache age [age],  [id number], 1stlive birth age 82,  No HRT, no birth control pills, abdominal hysterectomy,  non  smoker ,drinks alcohol occasionally anxiety, never smoker,no smokeless tobacco use, occasional  alcohol  Use no drug use no family hx breast /gyn cancer Allergies:NKA    Rebecca Eaton, RN 12/18/2014,12:33 PM

## 2014-12-19 ENCOUNTER — Encounter: Payer: Self-pay | Admitting: Radiation Oncology

## 2014-12-21 NOTE — Assessment & Plan Note (Signed)
Right breast invasive ductal carcinoma 3.6 cm by MRI with enlarged right axillary lymph node biopsy proven to be positive for cancer , T2 N1 M0 stage 2B clinical stage.ER positive PR positive HER-2 negative Ki-67 22%  Started 07/10/2014 Dose dense Adriamycin and Cytoxan every 2 weeks 4 followed by Taxol weekly 12; Completed 4 cycles of AC; Abraxane completed 9/12 discontinued for toxicities 11/18/2014  Breast MRI: Mass appears smaller, now measuring 2.3 x 4.9 x 2.7 cm. Previously using the same dimensions mass measured 3.4 x 4.6 x 2.7 cm. Decrease in axill LN.  Plan: 1. Surgery 2. Followed by XRT 3. Followed by Anti-estrogen therapy  RTC after surgery

## 2014-12-22 ENCOUNTER — Ambulatory Visit (HOSPITAL_BASED_OUTPATIENT_CLINIC_OR_DEPARTMENT_OTHER): Payer: Medicaid Other | Admitting: Hematology and Oncology

## 2014-12-22 ENCOUNTER — Ambulatory Visit
Admission: RE | Admit: 2014-12-22 | Discharge: 2014-12-22 | Disposition: A | Discharge status: Home / Self Care | Payer: Medicaid Other | Source: Ambulatory Visit | Attending: Radiation Oncology | Admitting: Radiation Oncology

## 2014-12-22 ENCOUNTER — Encounter: Payer: Self-pay | Admitting: Radiation Oncology

## 2014-12-22 VITALS — BP 212/68 | HR 50 | Temp 98.4°F | Resp 20 | Ht 67.0 in | Wt 240.0 lb

## 2014-12-22 VITALS — BP 208/72 | HR 90 | Temp 98.1°F | Resp 18 | Ht 65.0 in | Wt 240.8 lb

## 2014-12-22 DIAGNOSIS — Z170 Estrogen receptor positive status [ER+]: Secondary | ICD-10-CM | POA: Diagnosis not present

## 2014-12-22 DIAGNOSIS — Z510 Encounter for antineoplastic radiation therapy: Secondary | ICD-10-CM | POA: Diagnosis not present

## 2014-12-22 DIAGNOSIS — C50411 Malignant neoplasm of upper-outer quadrant of right female breast: Secondary | ICD-10-CM

## 2014-12-22 DIAGNOSIS — C50911 Malignant neoplasm of unspecified site of right female breast: Secondary | ICD-10-CM | POA: Diagnosis not present

## 2014-12-22 HISTORY — DX: Myoneural disorder, unspecified: G70.9

## 2014-12-22 HISTORY — DX: Anxiety disorder, unspecified: F41.9

## 2014-12-22 HISTORY — DX: Nonscarring hair loss, unspecified: L65.9

## 2014-12-22 HISTORY — DX: Malignant neoplasm of unspecified site of unspecified female breast: C50.919

## 2014-12-22 NOTE — Progress Notes (Signed)
Please see the Nurse Progress Note in the MD Initial Consult Encounter for this patient. 

## 2014-12-22 NOTE — Progress Notes (Signed)
Patient Care Team: Cyndi Bender, PA-C as PCP - General (Physician Assistant)  DIAGNOSIS: No matching staging information was found for the patient.  SUMMARY OF ONCOLOGIC HISTORY:   Breast cancer of upper-outer quadrant of right female breast   05/27/2014 Mammogram Right breast mass by mammogram 5.4 cm by ultrasound 4 x 4 X 2 cm   05/28/2014 Initial Biopsy Right breast biopsy 11:30: Invasive ductal carcinoma with lymphovascular invasion grade 1/2, ER 100%, PR 97%, Ki-67 22%, HER-2 negative ratio 1.26 average copy #1.7, lymph node biopsy also positive   06/16/2014 Breast MRI right breast cancer: 3.6 cm at 12:00 position mildly enlarged right axillary lymph node: 5 mm oval enhancing mass in the central left breast could be an intramammary lymph node   07/10/2014 - 11/11/2014 Neo-Adjuvant Chemotherapy Dose dense Adriamycin and Cytoxan 4 followed by Abraxane 12   07/10/2014 Procedure PREVENT clinical trial (Lipitor vs Placebo) in patients undergoing cardiotoxic chemotherapy   12/08/2014 Breast MRI Mass appears smaller, now measuring 2.3 x 4.9 x 2.7 cm. Previously using the same dimensions mass measured 3.4 x 4.6 x 2.7 cm. Improvement of axill LN    CHIEF COMPLIANT:  Follow-up to discuss breast MRI  INTERVAL HISTORY: Marissa Haney is a  59 year old with above-mentioned history of right breast cancer treated with new adjuvant chemotherapy and undergone breast MRI and is here today to discuss the results. She is feeling a whole lot better since chemotherapy was discontinued. She had decreased neuropathy symptoms. Decrease in pain in the extremities as well. Hemorrhage and also improving. She has appointment with radiation oncology today and an appointment with Dr. Dalbert Batman tomorrow.  REVIEW OF SYSTEMS:   Constitutional: Denies fevers, chills or abnormal weight loss Eyes: Denies blurriness of vision Ears, nose, mouth, throat, and face: Denies mucositis or sore throat Respiratory: Denies cough, dyspnea or  wheezes Cardiovascular: Denies palpitation, chest discomfort or lower extremity swelling Gastrointestinal:  Denies nausea, heartburn or change in bowel habits Skin: Denies abnormal skin rashes Lymphatics: Denies new lymphadenopathy or easy bruising Neurological: tingling numbness and pain in extremities Behavioral/Psych: Mood is stable, no new changes  Breast:  denies any pain or lumps or nodules in either breasts All other systems were reviewed with the patient and are negative.  I have reviewed the past medical history, past surgical history, social history and family history with the patient and they are unchanged from previous note.  ALLERGIES:  has No Known Allergies.  MEDICATIONS:  Current Outpatient Prescriptions  Medication Sig Dispense Refill  . ALPRAZolam (XANAX) 0.25 MG tablet Take 1 tablet (0.25 mg total) by mouth 2 (two) times daily as needed for anxiety. 30 tablet 0  . glipiZIDE (GLUCOTROL) 5 MG tablet Take 5 mg by mouth 2 (two) times daily before a meal.    . lisinopril (PRINIVIL,ZESTRIL) 10 MG tablet Take 1 tablet (10 mg total) by mouth daily. 30 tablet 3  . LORazepam (ATIVAN) 0.5 MG tablet Take 1 tablet (0.5 mg total) by mouth every 6 (six) hours as needed (Nausea or vomiting). 30 tablet 0  . meclizine (ANTIVERT) 25 MG tablet Take 1 tablet (25 mg total) by mouth 2 (two) times daily. 60 tablet 3  . metFORMIN (GLUCOPHAGE) 500 MG tablet Take 500 mg by mouth.    . metoprolol (LOPRESSOR) 50 MG tablet Take 1 tablet (50 mg total) by mouth 2 (two) times daily. 60 tablet 1  . ondansetron (ZOFRAN) 8 MG tablet TAKE 1 TABLET BY MOUTH 2 TIMES DAILY AS NEEDED. START ON  THE THIRD DAY AFTER CHEMOTHERAPY. 30 tablet 1   No current facility-administered medications for this visit.    PHYSICAL EXAMINATION: ECOG PERFORMANCE STATUS: 1 - Symptomatic but completely ambulatory  Filed Vitals:   12/22/14 1458  BP: 208/72  Pulse: 90  Temp: 98.1 F (36.7 C)  Resp: 18   Filed Weights    12/22/14 1458  Weight: 240 lb 12.8 oz (109.226 kg)    GENERAL:alert, no distress and comfortable SKIN: skin color, texture, turgor are normal, no rashes or significant lesions EYES: normal, Conjunctiva are pink and non-injected, sclera clear OROPHARYNX:no exudate, no erythema and lips, buccal mucosa, and tongue normal  NECK: supple, thyroid normal size, non-tender, without nodularity LYMPH:  no palpable lymphadenopathy in the cervical, axillary or inguinal LUNGS: clear to auscultation and percussion with normal breathing effort HEART: regular rate & rhythm and no murmurs and no lower extremity edema ABDOMEN:abdomen soft, non-tender and normal bowel sounds Musculoskeletal:no cyanosis of digits and no clubbing  NEURO: alert & oriented x 3 with fluent speech,  Neuropathy grade 2   LABORATORY DATA:  I have reviewed the data as listed   Chemistry      Component Value Date/Time   NA 142 11/18/2014 1406   NA 143 06/18/2014 1444   K 3.5 11/18/2014 1406   K 3.6* 06/18/2014 1444   CL 105 06/18/2014 1444   CO2 26 11/18/2014 1406   CO2 26 06/18/2014 1444   BUN 9.0 11/18/2014 1406   BUN 11 06/18/2014 1444   CREATININE 0.9 11/18/2014 1406   CREATININE 0.67 06/18/2014 1444      Component Value Date/Time   CALCIUM 9.1 11/18/2014 1406   CALCIUM 9.0 06/18/2014 1444   ALKPHOS 94 11/18/2014 1406   ALKPHOS 138* 06/18/2014 1444   AST 16 11/18/2014 1406   AST 15 06/18/2014 1444   ALT 15 11/18/2014 1406   ALT 22 06/18/2014 1444   BILITOT 0.57 11/18/2014 1406   BILITOT 0.5 06/18/2014 1444       Lab Results  Component Value Date   WBC 6.0 11/18/2014   HGB 12.3 11/18/2014   HCT 36.7 11/18/2014   MCV 96.3 11/18/2014   PLT 265 11/18/2014   NEUTROABS 3.4 11/18/2014     RADIOGRAPHIC STUDIES: I have personally reviewed the radiology reports and agreed with their findings. Breast MRI showed minimal response in the breast , improvement in axillary lymph nodes  ASSESSMENT & PLAN:   Breast cancer of upper-outer quadrant of right female breast Right breast invasive ductal carcinoma 3.6 cm by MRI with enlarged right axillary lymph node biopsy proven to be positive for cancer , T2 N1 M0 stage 2B clinical stage.ER positive PR positive HER-2 negative Ki-67 22%  Chemotherapy summary:Started 07/10/2014 Dose dense Adriamycin and Cytoxan every 2 weeks 4 followed by Taxol weekly 12; Completed 4 cycles of AC; Abraxane completed 9/12 discontinued for toxicities 11/18/2014  Breast MRI: Mass appears smaller, now measuring 2.3 x 4.9 x 2.7 cm. Previously using the same dimensions mass measured 3.4 x 4.6 x 2.7 cm. Decrease in axill LN. I reviewed the MRI with the patient. I discussed with her that it would be up to Dr. Dalbert Batman to decide whether or not she can undergo lumpectomy. She would need sentinel lymph node study as well. Patient has an appointment to see radiation oncology today.  Plan: 1. Surgery 2. Followed by XRT (based on  Type of surgery and lymph node status) 3. Followed by Anti-estrogen therapy  RTC after surgery  No orders of the defined types were placed in this encounter.   The patient has a good understanding of the overall plan. she agrees with it. she will call with any problems that may develop before the next visit here.   Rulon Eisenmenger, MD

## 2014-12-22 NOTE — Progress Notes (Signed)
Radiation Oncology         [phone] ________________________________  Name: Marissa Haney MRN: [id number]  Date: 12/22/2014  DOB: 1955/12/07  UX:LKGMWN,UUVOZD, PA-C  Nicholas Lose, MD     REFERRING PHYSICIAN: Nicholas Lose, MD   DIAGNOSIS: The encounter diagnosis was Breast cancer of upper-outer quadrant of right female breast.    HISTORY OF PRESENT ILLNESS::Marissa Haney is a 59 y.o. female who is seen for an initial consultation visit.  The patient is diagnosed with right breast cancer (T2N1 stage IIB) in the upper outer quadrant. Patient personally found lump on breast. 05/27/14 mammogram of right breast revealed mass of 5.4 cm. She underwent biopsy on 05/28/14 which revealed invasive ductal carcinoma with lymphovascular invasion. She is ER/PR positive, HER-2 negative. Lymph node biopsy was also positive. She underwent a breast MRI on 06/16/2014 that revealed a 3.6 cm mass in the right breast with an enlarged right axillary lymph node. In addition, there was a 5 mm oval enhancing mass in the central left breast thought to be an intramammary lymph node. Patient underwent chemotherapy however she discontinued last two treatments due to nausea. She presents today for consultation on the management of her disease.       Breast cancer of upper-outer quadrant of right female breast   05/27/2014 Mammogram Right breast mass by mammogram 5.4 cm by ultrasound 4 x 4 X 2 cm   05/28/2014 Initial Biopsy Right breast biopsy 11:30: Invasive ductal carcinoma with lymphovascular invasion grade 1/2, ER 100%, PR 97%, Ki-67 22%, HER-2 negative ratio 1.26 average copy #1.7, lymph node biopsy also positive   06/16/2014 Breast MRI right breast cancer: 3.6 cm at 12:00 position mildly enlarged right axillary lymph node: 5 mm oval enhancing mass in the central left breast could be an intramammary lymph node   07/10/2014 - 11/11/2014 Neo-Adjuvant Chemotherapy Dose dense Adriamycin and Cytoxan 4 followed by  Abraxane 12   07/10/2014 Procedure PREVENT clinical trial (Lipitor vs Placebo) in patients undergoing cardiotoxic chemotherapy   12/08/2014 Breast MRI Mass appears smaller, now measuring 2.3 x 4.9 x 2.7 cm. Previously using the same dimensions mass measured 3.4 x 4.6 x 2.7 cm. Improvement of axill LN      PREVIOUS RADIATION THERAPY: No   PAST MEDICAL HISTORY:  has a past medical history of Hypertension; Diabetes mellitus without complication; Cancer; Breast cancer; Alopecia; Neuromuscular disorder; and Anxiety.     PAST SURGICAL HISTORY: Past Surgical History  Procedure Laterality Date  . Abdominal hysterectomy    . Axillary lymph node biopsy    . Colonoscopy    . Portacath placement Left 06/19/2014    Procedure: INSERTION PORT-A-CATH;  Surgeon: Fanny Skates, MD;  Location: [place];  Service: General;  Laterality: Left;     FAMILY HISTORY: family history includes Hypertension in her mother.   SOCIAL HISTORY:  reports that she has never smoked. She has never used smokeless tobacco. She reports that she does not drink alcohol or use illicit drugs.   ALLERGIES: Review of patient's allergies indicates no known allergies.   MEDICATIONS:  Current Outpatient Prescriptions  Medication Sig Dispense Refill  . glipiZIDE (GLUCOTROL) 5 MG tablet Take 5 mg by mouth 2 (two) times daily before a meal.    . lisinopril (PRINIVIL,ZESTRIL) 10 MG tablet Take 1 tablet (10 mg total) by mouth daily. 30 tablet 3  . meclizine (ANTIVERT) 25 MG tablet Take 1 tablet (25 mg total) by mouth 2 (two) times daily. 60 tablet 3  .  metFORMIN (GLUCOPHAGE) 500 MG tablet Take 500 mg by mouth.    . metoprolol (LOPRESSOR) 50 MG tablet Take 1 tablet (50 mg total) by mouth 2 (two) times daily. 60 tablet 1  . ALPRAZolam (XANAX) 0.25 MG tablet Take 1 tablet (0.25 mg total) by mouth 2 (two) times daily as needed for anxiety. (Patient not taking: Reported on 12/22/2014) 30 tablet 0  . LORazepam (ATIVAN)  0.5 MG tablet Take 1 tablet (0.5 mg total) by mouth every 6 (six) hours as needed (Nausea or vomiting). (Patient not taking: Reported on 12/22/2014) 30 tablet 0  . ondansetron (ZOFRAN) 8 MG tablet TAKE 1 TABLET BY MOUTH 2 TIMES DAILY AS NEEDED. START ON THE THIRD DAY AFTER CHEMOTHERAPY. (Patient not taking: Reported on 12/22/2014) 30 tablet 1   No current facility-administered medications for this encounter.     REVIEW OF SYSTEMS:  A 15 point review of systems is documented in the electronic medical record. This was obtained by the nursing staff. However, I reviewed this with the patient to discuss relevant findings and make appropriate changes.  Pertinent items are noted in HPI.    PHYSICAL EXAM:  height is '5\' 7"'  (1.702 m) and weight is 240 lb (108.863 kg). Her oral temperature is 98.4 F (36.9 C). Her blood pressure is 212/68 and her pulse is 50. Her respiration is 20 and oxygen saturation is 100%.   General: Well-developed, in no acute distress HEENT: Normocephalic, atraumatic; oral cavity clear Neck: Supple without any lymphadenopathy Cardiovascular: Regular rate and rhythm Respiratory: Clear to auscultation bilaterally GI: Soft, nontender, normal bowel sounds Extremities: No edema present Neuro: No focal deficits Palpable mass in the upper aspect of the right breast.I did not feel any lymph nodes.  ECOG = 1  0 - Asymptomatic (Fully active, able to carry on all predisease activities without restriction)  1 - Symptomatic but completely ambulatory (Restricted in physically strenuous activity but ambulatory and able to carry out work of a light or sedentary nature. For example, light housework, office work)  2 - Symptomatic, <50% in bed during the day (Ambulatory and capable of all self care but unable to carry out any work activities. Up and about more than 50% of waking hours)  3 - Symptomatic, >50% in bed, but not bedbound (Capable of only limited self-care, confined to bed or chair  50% or more of waking hours)  4 - Bedbound (Completely disabled. Cannot carry on any self-care. Totally confined to bed or chair)  5 - Death   Eustace Pen MM, Creech RH, Tormey DC, et al. [phone]). "Toxicity and response criteria of the [facility]". [place] Oncol. 5 (6): 649-55  General: Well-developed, in no acute distress HEENT: Normocephalic, atraumatic; oral cavity clear Neck: Supple without any lymphadenopathy Cardiovascular: Regular rate and rhythm Respiratory: Clear to auscultation bilaterally GI: Soft, nontender, normal bowel sounds Extremities: No edema present Neuro: No focal deficits     LABORATORY DATA:  Lab Results  Component Value Date   WBC 6.0 11/18/2014   HGB 12.3 11/18/2014   HCT 36.7 11/18/2014   MCV 96.3 11/18/2014   PLT 265 11/18/2014   Lab Results  Component Value Date   NA 142 11/18/2014   K 3.5 11/18/2014   CL 105 06/18/2014   CO2 26 11/18/2014   Lab Results  Component Value Date   ALT 15 11/18/2014   AST 16 11/18/2014   ALKPHOS 94 11/18/2014   BILITOT 0.57 11/18/2014      RADIOGRAPHY:  Mr Breast Bilateral W Wo Contrast  12/08/2014   CLINICAL DATA:  Status post neoadjuvant chemotherapy. Followup right breast cancer diagnosed in November 2015.  LABS:  None applicable  EXAM: BILATERAL BREAST MRI WITH AND WITHOUT CONTRAST  TECHNIQUE: Multiplanar, multisequence MR images of both breasts were obtained prior to and following the intravenous administration of 20 ml of MultiHance.  THREE-DIMENSIONAL MR IMAGE RENDERING ON INDEPENDENT WORKSTATION:  Three-dimensional MR images were rendered by post-processing of the original MR data on an independent workstation. The three-dimensional MR images were interpreted, and findings are reported in the following complete MRI report for this study. Three dimensional images were evaluated at the independent DynaCad workstation  COMPARISON:  MRI 06/13/2014  FINDINGS: Breast composition: b. Scattered  fibroglandular tissue.  Background parenchymal enhancement: Minimal  Right breast: Within the upper central portion of the right breast there is an enhancing mass containing signal void artifacts and consistent with known malignancy. Mass appears smaller, now measuring 2.3 x 4.9 x 2.7 cm. Previously using the same dimensions mass measured 3.4 x 4.6 x 2.7 cm.  Left breast: Stable appearance of small nodule in the central portion of the left breast measuring 5 mm in appearance and likely representing an intramammary lymph node. No new abnormalities.  Lymph nodes: Normal appearing axillary lymph nodes bilaterally. There has been improvement in the appearance of enlarged right axillary lymph nodes. Stable enlarged right cardiophrenic angle lymph node measures 7 mm 1.7 x 0.7 cm  Ancillary findings:  None.  IMPRESSION: 1. Slightly smaller spiculated mass within the upper central portion of the right breast consistent interval treatment. 2. Improvement of enlarged right axillary lymph nodes. 3. Stable appearance of right cardiophrenic angle lymph node. 4. No suspicious findings in the left breast.  RECOMMENDATION: Treatment plan is recommended.  BI-RADS CATEGORY  6: Known biopsy-proven malignancy.   Electronically Signed   By: Nolon Nations M.D.   On: 12/08/2014 11:40       IMPRESSION: Patient initially presented with large T2/T3  breast cancer of upper-outer quadrant of right female breast which was node positive. Based on initial presentation and recent MRI scan she is likely a good candidate for radiation treatment after either lumpectomy or mastectomy.    PLAN: We discussed the possible side effects and risks of treatment in addition to the possible benefits of treatment. We discussed the protocol for radiation treatment.  All of the patient's questions were answered. Has appointment with Dr. Dalbert Batman from surgery tomorrow.   Gave appointment card to follow up in 5 weeks to review surgery results.   I  spent 50 minutes face to face with the patient and more than 50% of that time was spent in counseling and/or coordination of care.    ________________________________   Jodelle Gross, MD, PhD   **Disclaimer: This note was dictated with voice recognition software. Similar sounding words can inadvertently be transcribed and this note may contain transcription errors which may not have been corrected upon publication of note.**   This document serves as a record of services personally performed by Kyung Rudd, MD. It was created on his behalf by Derek Mound, a trained medical scribe. The creation of this record is based on the scribe's personal observations and the provider's statements to them. This document has been checked and approved by the attending provider.

## 2014-12-23 ENCOUNTER — Inpatient Hospital Stay: Admission: RE | Admit: 2014-12-23 | Payer: Self-pay | Source: Ambulatory Visit | Admitting: Radiation Oncology

## 2014-12-24 ENCOUNTER — Encounter: Payer: Self-pay | Admitting: *Deleted

## 2014-12-24 NOTE — Progress Notes (Signed)
[facility] [place] Work  Clinical Social Work was referred by patient for assessment of psychosocial needs due to financial concerns.  Clinical Social Worker contacted patient at home to offer support and assess for needs.  Pt had been approved for assistance through the [employer] and pt provided CSW with bills to submit on her behalf. CSW contacted [employer] and submitted bills as requested. Pt reports she is still waiting word on her SSD application and expects some sort of notification on 01/03/15. Per [employer] rep, this would be the last month pt could receive assistance through their organization. Pt denied other needs currently.  Clinical Social Work interventions:  Resource assistance  Loren Racer, [place] Worker [place]  [place] Phone: [phone] Fax: [phone]

## 2014-12-25 ENCOUNTER — Telehealth: Payer: Self-pay

## 2014-12-25 NOTE — Telephone Encounter (Signed)
Pt called stating that the wrong MRI was faxed to the social security disability claim handler. Ivin Booty is the handler's name. Sharon's number is [phone]. Called and received fax # [phone]. Current MRI faxed to [facility]

## 2014-12-30 ENCOUNTER — Telehealth: Payer: Self-pay | Admitting: *Deleted

## 2014-12-30 NOTE — Telephone Encounter (Signed)
THE RESULTS OF THE MRI BREAST DONE ON 12/08/14 HAVE NOT BEEN RECEIVED BY SHARON AT SOCIAL SECURITY DISABILITY. CALLED THE MEDICAL RECORDS DEPARTMENT AT [facility] TO SEE IF THE MRI RESULTS COULD BE FAXED TO TRIAGE OR TO SHARON AT FAX NUMBER #[phone].

## 2015-01-01 ENCOUNTER — Telehealth: Payer: Self-pay | Admitting: *Deleted

## 2015-01-01 ENCOUNTER — Other Ambulatory Visit: Payer: Self-pay | Admitting: General Surgery

## 2015-01-01 DIAGNOSIS — C50911 Malignant neoplasm of unspecified site of right female breast: Secondary | ICD-10-CM

## 2015-01-01 NOTE — Telephone Encounter (Signed)
Patient called stating that she had requested MRI results be faxed to Ivin Booty at social security disability. States that Ivin Booty had not received these results. Called and spoke with medical records at [facility] and they will fax results to [facility] at [phone].

## 2015-01-12 ENCOUNTER — Other Ambulatory Visit: Payer: Self-pay | Admitting: General Surgery

## 2015-01-12 DIAGNOSIS — C773 Secondary and unspecified malignant neoplasm of axilla and upper limb lymph nodes: Principal | ICD-10-CM

## 2015-01-12 DIAGNOSIS — C50911 Malignant neoplasm of unspecified site of right female breast: Secondary | ICD-10-CM

## 2015-01-13 ENCOUNTER — Other Ambulatory Visit: Payer: Medicaid Other

## 2015-01-15 ENCOUNTER — Telehealth: Payer: Self-pay | Admitting: Hematology and Oncology

## 2015-01-15 ENCOUNTER — Other Ambulatory Visit: Payer: Self-pay

## 2015-01-15 NOTE — Telephone Encounter (Signed)
lvm for pt regarding to Aug appt...pt ok and aware

## 2015-01-19 NOTE — Pre-Procedure Instructions (Signed)
Marissa Haney  01/19/2015      [facility] [address] [place] [address] Phone: [phone] Fax: [place], [place] [address] Phone: [phone] Fax: [phone]  [place], [place] [address] Phone: [phone] Fax: [phone]    Your procedure is scheduled on Monday, January 26, 2015  Report to [facility] Admitting at 9:30 A.M.  Call this number if you have problems the morning of surgery:  [phone]   Remember:  Do not eat food or drink liquids after midnight Sunday, January 25, 2015  Take these medicines the morning of surgery with A SIP OF WATER :meclizine (ANTIVERT),  metoprolol (LOPRESSOR), if needed:ALPRAZolam Duanne Moron) for anxiety  DO NOT take any diabetic medication the morning of procedure such as glipiZIDE (GLUCOTROL)  Stop taking Aspirin, vitamins and herbal medications. Do not take any NSAIDs ie: Ibuprofen, Advil, Naproxen or any medication containing Aspirin; stop now.  Do not wear jewelry, make-up or nail polish.  Do not wear lotions, powders, or perfumes.  You may not  wear deodorant.  Do not shave 48 hours prior to surgery.    Do not bring valuables to the hospital.  [facility] is not responsible for any belongings or valuables.  Contacts, dentures or bridgework may not be worn into surgery.  Leave your suitcase in the car.  After surgery it may be brought to your room.  For patients admitted to the hospital, discharge time will be determined by your treatment team.  Patients discharged the day of surgery will not be allowed to drive home.   Name and phone number of your driver:  Special instructions:  [place] - Preparing for Surgery  Before surgery, you can play an important role.  Because skin is  not sterile, your skin needs to be as free of germs as possible.  You can reduce the number of germs on you skin by washing with CHG (chlorahexidine gluconate) soap before surgery.  CHG is an antiseptic cleaner which kills germs and bonds with the skin to continue killing germs even after washing.  Please DO NOT use if you have an allergy to CHG or antibacterial soaps.  If your skin becomes reddened/irritated stop using the CHG and inform your nurse when you arrive at Short Stay.  Do not shave (including legs and underarms) for at least 48 hours prior to the first CHG shower.  You may shave your face.  Please follow these instructions carefully:   1.  Shower with CHG Soap the night before surgery and the morning of Surgery.  2.  If you choose to wash your hair, wash your hair first as usual with your normal shampoo.  3.  After you shampoo, rinse your hair and body thoroughly to remove the Shampoo.  4.  Use CHG as you would any other liquid soap.  You can apply chg directly  to the skin and wash gently with scrungie or a clean washcloth.  5.  Apply the CHG Soap to your body ONLY FROM THE NECK DOWN.  Do not use on open wounds or open sores.  Avoid contact with your eyes, ears, mouth and genitals (private parts).  Wash genitals (private parts) with your normal soap.  6.  Wash thoroughly, paying special attention  to the area where your surgery will be performed.  7.  Thoroughly rinse your body with warm water from the neck down.  8.  DO NOT shower/wash with your normal soap after using and rinsing off the CHG Soap.  9.  Pat yourself dry with a clean towel.            10.  Wear clean pajamas.            11.  Place clean sheets on your bed the night of your first shower and do not sleep with pets.  Day of Surgery  Do not apply any lotions/deodorants the morning of surgery.  Please wear clean clothes to the hospital/surgery center.  Please read over the following fact sheets that you were  given. Pain Booklet, Coughing and Deep Breathing and Surgical Site Infection Prevention

## 2015-01-20 ENCOUNTER — Encounter (HOSPITAL_COMMUNITY)
Admission: RE | Admit: 2015-01-20 | Discharge: 2015-01-20 | Disposition: A | Discharge status: Home / Self Care | Payer: Medicaid Other | Source: Ambulatory Visit | Attending: General Surgery | Admitting: General Surgery

## 2015-01-20 ENCOUNTER — Encounter (HOSPITAL_COMMUNITY): Payer: Self-pay

## 2015-01-20 DIAGNOSIS — C50911 Malignant neoplasm of unspecified site of right female breast: Secondary | ICD-10-CM | POA: Diagnosis not present

## 2015-01-20 DIAGNOSIS — C50919 Malignant neoplasm of unspecified site of unspecified female breast: Secondary | ICD-10-CM | POA: Insufficient documentation

## 2015-01-20 DIAGNOSIS — Z01812 Encounter for preprocedural laboratory examination: Secondary | ICD-10-CM | POA: Insufficient documentation

## 2015-01-20 DIAGNOSIS — Z01818 Encounter for other preprocedural examination: Secondary | ICD-10-CM | POA: Insufficient documentation

## 2015-01-20 HISTORY — DX: Urgency of urination: R39.15

## 2015-01-20 HISTORY — DX: Unspecified asthma, uncomplicated: J45.909

## 2015-01-20 HISTORY — DX: Family history of other specified conditions: Z84.89

## 2015-01-20 LAB — URINE MICROSCOPIC-ADD ON

## 2015-01-20 LAB — CBC WITH DIFFERENTIAL/PLATELET
BASOS ABS: 0 10*3/uL (ref 0.0–0.1)
Basophils Relative: 0 % (ref 0–1)
Eosinophils Absolute: 0.3 10*3/uL (ref 0.0–0.7)
Eosinophils Relative: 4 % (ref 0–5)
HCT: 39.1 % (ref 36.0–46.0)
HEMOGLOBIN: 12.9 g/dL (ref 12.0–15.0)
LYMPHS ABS: 2.4 10*3/uL (ref 0.7–4.0)
Lymphocytes Relative: 30 % (ref 12–46)
MCH: 30.9 pg (ref 26.0–34.0)
MCHC: 33 g/dL (ref 30.0–36.0)
MCV: 93.8 fL (ref 78.0–100.0)
Monocytes Absolute: 0.4 10*3/uL (ref 0.1–1.0)
Monocytes Relative: 5 % (ref 3–12)
Neutro Abs: 4.9 10*3/uL (ref 1.7–7.7)
Neutrophils Relative %: 61 % (ref 43–77)
PLATELETS: 200 10*3/uL (ref 150–400)
RBC: 4.17 MIL/uL (ref 3.87–5.11)
RDW: 13.1 % (ref 11.5–15.5)
WBC: 8 10*3/uL (ref 4.0–10.5)

## 2015-01-20 LAB — COMPREHENSIVE METABOLIC PANEL
ALT: 17 U/L (ref 14–54)
ANION GAP: 8 (ref 5–15)
AST: 21 U/L (ref 15–41)
Albumin: 3.5 g/dL (ref 3.5–5.0)
Alkaline Phosphatase: 122 U/L (ref 38–126)
BILIRUBIN TOTAL: 1.1 mg/dL (ref 0.3–1.2)
BUN: 9 mg/dL (ref 6–20)
CO2: 26 mmol/L (ref 22–32)
CREATININE: 1.1 mg/dL — AB (ref 0.44–1.00)
Calcium: 8.7 mg/dL — ABNORMAL LOW (ref 8.9–10.3)
Chloride: 105 mmol/L (ref 101–111)
GFR calc non Af Amer: 53 mL/min — ABNORMAL LOW (ref >60)
Glucose, Bld: 255 mg/dL — ABNORMAL HIGH (ref 65–99)
Potassium: 3.3 mmol/L — ABNORMAL LOW (ref 3.5–5.1)
Sodium: 139 mmol/L (ref 135–145)
Total Protein: 7.5 g/dL (ref 6.5–8.1)

## 2015-01-20 LAB — URINALYSIS, ROUTINE W REFLEX MICROSCOPIC
BILIRUBIN URINE: NEGATIVE
GLUCOSE, UA: NEGATIVE mg/dL
KETONES UR: NEGATIVE mg/dL
Nitrite: POSITIVE — AB
PH: 5.5 (ref 5.0–8.0)
Protein, ur: NEGATIVE mg/dL
SPECIFIC GRAVITY, URINE: 1.018 (ref 1.005–1.030)
UROBILINOGEN UA: 0.2 mg/dL (ref 0.0–1.0)

## 2015-01-20 LAB — GLUCOSE, CAPILLARY: Glucose-Capillary: 192 mg/dL — ABNORMAL HIGH (ref 65–99)

## 2015-01-20 NOTE — Progress Notes (Signed)
   01/20/15 1019  OBSTRUCTIVE SLEEP APNEA  Have you ever been diagnosed with sleep apnea through a sleep study? No  Do you snore loudly (loud enough to be heard through closed doors)?  1  Do you often feel tired, fatigued, or sleepy during the daytime? 1  Has anyone observed you stop breathing during your sleep? 0  Do you have, or are you being treated for high blood pressure? 1  BMI more than 35 kg/m2? 1  Age over 60 years old? 1  Neck circumference greater than 40 cm/16 inches? 1 (17)  Gender: 0

## 2015-01-21 LAB — HEMOGLOBIN A1C
Hgb A1c MFr Bld: 6.5 % — ABNORMAL HIGH (ref 4.8–5.6)
MEAN PLASMA GLUCOSE: 140 mg/dL

## 2015-01-22 ENCOUNTER — Telehealth: Payer: Self-pay | Admitting: *Deleted

## 2015-01-22 NOTE — Telephone Encounter (Signed)
Called  Patient home an asked if she was going to have her surgery that is scheduled for Monday 01/26/15 starting at 0730 am, "Yes that;s right" , we will cancel the [facility]  Monday the 01/26/15 with Dr. Lisbeth Renshaw and willc all her with a new appt after he gets back in office next week, patient thanked this RN for the call ,then called Earleen Newport, and asked her to cancel Mondays appt , will reschedule later  11:05 AM

## 2015-01-25 NOTE — H&P (Signed)
Marissa Haney  Location: [facility] Patient #: [id number] DOB: Apr 06, 1956 Single / Language: Cleophus Molt / Race: Black or African American Female        History of Present Illness The patient is a 59 year old female who presents with breast cancer. This is a pleasant 59 year old [place] American female who returns for surgical decision making regarding her right breast cancer. After lots of discussion, she has decided she would like to keep her breast if possible. We are going to go ahead and schedule her for a right breast partial mastectomy with double wire bracketed needle localization, and complete right axillary lymph node dissection. We had a long talk about this. She is aware that there is a 10-20% chance of positive margins requiring reexcision. We talked about the risks of surgery including bleeding, infection, significant cosmetic deformity. We also talked about the fact that I might find multicentric disease necessitating a mastectomy down the road. She knows that this could be a complete axillary lymph node dissection because of her node-positive disease. She is aware of the risk of arm swelling or numbness. She understands all of this and all of her questions were answered. She agrees with this plan. History is significant for palpable mass in December 2015. Imaging studies showed a large mass in the right breast superiorly and abnormal right axillary lymph nodes, both of which were biopsied, both of which showed invasive duct carcinoma, receptor positive, HER-2 negative. I placed a Port-A-Cath and she underwent chemotherapy. End of treatment MRI shows the tumor is slightly smaller but not much. Original dimensions were 3.4 x 4.6 x 2.7 cm. In the treatment dimensions are 2.3 x 4.9 x 2.7. The left breast shows a small intramammary lymph node. Right axilla looks normal. Dr. Lindi Adie states that we can take the port out. She is not a candidate for the last  trial because she has had ipsilateral axillary surgery. We will schedule in the near future.  Addendum Note(Jayron Maqueda M. Dalbert Batman MD; 01/12/2015 2:00 PM) Mammograms, ultrasound, and MRI pretreatment and post treatment were reviewed with Dr. Shon Hale at the breast center of [facility] today. The large tumor in her superior central breast appears to be a solitary finding. We discussed the best way to get 2 wires into this. My preference is to have both wires from a craniocaudal approach directly over the tumor. It is not clear whether these should be medial and lateral to the tumor or anterior and posterior. Review the MRI, it may be that anterior and posterior are best. Wires from lateral or medial will make it more difficult to localize and excise the tumor, and this was discussed.  Dr. Owens Shark request that we schedule a right breast ultrasound now, a week or 2 before the surgery, to help with treatment planning and wire placement decisions. We are sending the scheduling request in today.    Allergies ( No Known Drug Allergies12/09/2013  Medication History  MetFORMIN HCl ER (500MG Tablet ER 24HR, Oral) Active. Metoprolol Tartrate (50MG Tablet, Oral) Active. Zofran (8MG Tablet, Oral) Active. ALPRAZolam (0.25MG Tablet, Oral) Active. GlipiZIDE (5MG Tablet, Oral) Active. Lisinopril (10MG Tablet, Oral) Active. Medications Reconciled  Vitals   Weight: 237 lb Height: 67in Body Surface Area: 2.25 m Body Mass Index: 37.12 kg/m Temp.: 98.58F(Oral)  Pulse: 60 (Regular)  BP: 138/70 (Sitting, Left Arm, Standard)    Physical Exam General Note: Alert. Cooperative. No distress. Daughter is with her. BMI 37   Head and Neck Note: No adenopathy  or mass   Chest and Lung Exam Note: Clear to auscultation bilaterally. Port palpable left infraclavicular area   Breast Note: Examination of the right breast reveals a 5 cm palpable mass in the 12 o'clock position.  Less distinct. Quite mobile. Skin is healthy. Nipple looks fine. Axilla is negative clinically.   Cardiovascular Note: Regular rate and rhythm. No murmur. No ectopy.     Assessment & Plan CANCER OF CENTRAL PORTION OF RIGHT BREAST (174.1  C50.111)   Schedule for Surgery We have had another long talk, and you have stated he would like to keep your breast if possible. This may be a little bit tricky, but it is reasonable. you will be scheduled for right breast partial mastectomy with double wire bracketed needle localization and right axillary lymph node dissection and removal of port a cath. We've discussed the indications, techniques, and risk of this surgery in detail There is a 10-20% risk that we will get a positive margin and have to go back for reexcision of margins. Hopefully not Please read the printed information that I have given you.  Pt Education - Lumpectomy and Axillary Lymph Node Dissection: breast cancer   BREAST CANCER METASTASIZED TO AXILLARY LYMPH NODE, RIGHT (174.9  C50.911) S/P TAH-BSO (V88.01  Z90.710) BMI 37.0-37.9, ADULT (V85.37  Z68.37) DIABETES TYPE 2, CONTROLLED (250.00  E11.9) HYPERTENSION, BENIGN (401.1  I10)    Estee Yohe M. Dalbert Batman, M.D., [facility] General and Minimally invasive Surgery Breast and Colorectal Surgery Office:   [phone] Pager:   [phone]

## 2015-01-26 ENCOUNTER — Ambulatory Visit (HOSPITAL_COMMUNITY): Payer: Medicaid Other | Admitting: Certified Registered"

## 2015-01-26 ENCOUNTER — Ambulatory Visit
Admission: RE | Admit: 2015-01-26 | Discharge: 2015-01-26 | Disposition: A | Discharge status: Home / Self Care | Payer: Medicaid Other | Source: Ambulatory Visit | Attending: General Surgery | Admitting: General Surgery

## 2015-01-26 ENCOUNTER — Ambulatory Visit: Admission: RE | Admit: 2015-01-26 | Payer: Medicaid Other | Source: Ambulatory Visit | Admitting: Radiation Oncology

## 2015-01-26 ENCOUNTER — Encounter (HOSPITAL_COMMUNITY)
Admission: RE | Disposition: A | Discharge status: Home / Self Care | Payer: Self-pay | Source: Ambulatory Visit | Attending: General Surgery

## 2015-01-26 ENCOUNTER — Observation Stay (HOSPITAL_COMMUNITY)
Admission: RE | Admit: 2015-01-26 | Discharge: 2015-01-27 | Disposition: A | Discharge status: Home / Self Care | Payer: Medicaid Other | Source: Ambulatory Visit | Attending: General Surgery | Admitting: General Surgery

## 2015-01-26 ENCOUNTER — Ambulatory Visit (HOSPITAL_COMMUNITY): Payer: Medicaid Other | Admitting: Vascular Surgery

## 2015-01-26 ENCOUNTER — Encounter (HOSPITAL_COMMUNITY): Payer: Self-pay | Admitting: *Deleted

## 2015-01-26 DIAGNOSIS — C50911 Malignant neoplasm of unspecified site of right female breast: Secondary | ICD-10-CM

## 2015-01-26 DIAGNOSIS — E119 Type 2 diabetes mellitus without complications: Secondary | ICD-10-CM | POA: Insufficient documentation

## 2015-01-26 DIAGNOSIS — I10 Essential (primary) hypertension: Secondary | ICD-10-CM | POA: Insufficient documentation

## 2015-01-26 DIAGNOSIS — E669 Obesity, unspecified: Secondary | ICD-10-CM | POA: Diagnosis not present

## 2015-01-26 DIAGNOSIS — C50419 Malignant neoplasm of upper-outer quadrant of unspecified female breast: Secondary | ICD-10-CM | POA: Diagnosis present

## 2015-01-26 DIAGNOSIS — Z6837 Body mass index (BMI) 37.0-37.9, adult: Secondary | ICD-10-CM | POA: Diagnosis not present

## 2015-01-26 DIAGNOSIS — Z171 Estrogen receptor negative status [ER-]: Secondary | ICD-10-CM | POA: Diagnosis not present

## 2015-01-26 DIAGNOSIS — C50411 Malignant neoplasm of upper-outer quadrant of right female breast: Secondary | ICD-10-CM | POA: Diagnosis present

## 2015-01-26 HISTORY — PX: PORT-A-CATH REMOVAL: SHX5289

## 2015-01-26 HISTORY — PX: BREAST LUMPECTOMY: SHX2

## 2015-01-26 HISTORY — PX: BREAST LUMPECTOMY WITH NEEDLE LOCALIZATION AND AXILLARY LYMPH NODE DISSECTION: SHX5758

## 2015-01-26 LAB — CREATININE, SERUM
CREATININE: 0.82 mg/dL (ref 0.44–1.00)
GFR calc Af Amer: >60 mL/min (ref >60)
GFR calc non Af Amer: >60 mL/min (ref >60)

## 2015-01-26 LAB — CBC
HCT: 39.6 % (ref 36.0–46.0)
HEMOGLOBIN: 13.2 g/dL (ref 12.0–15.0)
MCH: 31.5 pg (ref 26.0–34.0)
MCHC: 33.3 g/dL (ref 30.0–36.0)
MCV: 94.5 fL (ref 78.0–100.0)
PLATELETS: 189 10*3/uL (ref 150–400)
RBC: 4.19 MIL/uL (ref 3.87–5.11)
RDW: 13 % (ref 11.5–15.5)
WBC: 13.4 10*3/uL — ABNORMAL HIGH (ref 4.0–10.5)

## 2015-01-26 LAB — GLUCOSE, CAPILLARY
GLUCOSE-CAPILLARY: 240 mg/dL — AB (ref 65–99)
Glucose-Capillary: 132 mg/dL — ABNORMAL HIGH (ref 65–99)

## 2015-01-26 SURGERY — BREAST LUMPECTOMY WITH NEEDLE LOCALIZATION AND AXILLARY LYMPH NODE DISSECTION
Anesthesia: General | Site: Chest | Laterality: Right

## 2015-01-26 MED ORDER — INSULIN ASPART 100 UNIT/ML ~~LOC~~ SOLN: 0.0000 [IU] | Freq: Three times a day (TID) | SUBCUTANEOUS | Status: DC

## 2015-01-26 MED ORDER — CIPROFLOXACIN IN D5W 400 MG/200ML IV SOLN
400.0000 mg | Freq: Once | INTRAVENOUS | Status: AC
  Administered 2015-01-26: 400 mg via INTRAVENOUS

## 2015-01-26 MED ORDER — PROPOFOL 10 MG/ML IV BOLUS
INTRAVENOUS | Status: DC | PRN
  Administered 2015-01-26: 150 mg via INTRAVENOUS
  Administered 2015-01-26: 50 mg via INTRAVENOUS

## 2015-01-26 MED ORDER — CEFAZOLIN SODIUM-DEXTROSE 2-3 GM-% IV SOLR
INTRAVENOUS | Status: AC
  Filled 2015-01-26: qty 50

## 2015-01-26 MED ORDER — GLYCOPYRROLATE 0.2 MG/ML IJ SOLN
INTRAMUSCULAR | Status: AC
  Filled 2015-01-26: qty 1

## 2015-01-26 MED ORDER — POTASSIUM CHLORIDE IN NACL 20-0.9 MEQ/L-% IV SOLN
INTRAVENOUS | Status: DC
  Administered 2015-01-26: 18:00:00 via INTRAVENOUS
  Filled 2015-01-26: qty 1000

## 2015-01-26 MED ORDER — MIDAZOLAM HCL 2 MG/2ML IJ SOLN: 0.5000 mg | Freq: Once | INTRAMUSCULAR | Status: DC | PRN

## 2015-01-26 MED ORDER — BUPIVACAINE-EPINEPHRINE 0.25% -1:200000 IJ SOLN
INTRAMUSCULAR | Status: DC | PRN
  Administered 2015-01-26: 13 mL

## 2015-01-26 MED ORDER — BUPIVACAINE-EPINEPHRINE (PF) 0.5% -1:200000 IJ SOLN
INTRAMUSCULAR | Status: DC | PRN
  Administered 2015-01-26: 30 mL

## 2015-01-26 MED ORDER — CEFAZOLIN SODIUM-DEXTROSE 2-3 GM-% IV SOLR
2.0000 g | INTRAVENOUS | Status: AC
  Administered 2015-01-26: 2 g via INTRAVENOUS

## 2015-01-26 MED ORDER — HYDROMORPHONE HCL 1 MG/ML IJ SOLN
1.0000 mg | INTRAMUSCULAR | Status: DC | PRN
  Administered 2015-01-26 – 2015-01-27 (×2): 1 mg via INTRAVENOUS
  Filled 2015-01-26 (×2): qty 1

## 2015-01-26 MED ORDER — LISINOPRIL 10 MG PO TABS
10.0000 mg | ORAL_TABLET | Freq: Every day | ORAL | Status: DC
  Administered 2015-01-26: 10 mg via ORAL
  Filled 2015-01-26 (×2): qty 1

## 2015-01-26 MED ORDER — MEPERIDINE HCL 25 MG/ML IJ SOLN: 6.2500 mg | INTRAMUSCULAR | Status: DC | PRN

## 2015-01-26 MED ORDER — HYDROMORPHONE HCL 1 MG/ML IJ SOLN
INTRAMUSCULAR | Status: AC
  Filled 2015-01-26: qty 1

## 2015-01-26 MED ORDER — FENTANYL CITRATE (PF) 100 MCG/2ML IJ SOLN
INTRAMUSCULAR | Status: DC | PRN
  Administered 2015-01-26: 50 ug via INTRAVENOUS
  Administered 2015-01-26: 100 ug via INTRAVENOUS
  Administered 2015-01-26: 25 ug via INTRAVENOUS

## 2015-01-26 MED ORDER — HYDROMORPHONE HCL 1 MG/ML IJ SOLN
0.2500 mg | INTRAMUSCULAR | Status: DC | PRN
  Administered 2015-01-26 (×2): 0.5 mg via INTRAVENOUS

## 2015-01-26 MED ORDER — FENTANYL CITRATE (PF) 100 MCG/2ML IJ SOLN
100.0000 ug | Freq: Once | INTRAMUSCULAR | Status: DC
  Filled 2015-01-26: qty 2

## 2015-01-26 MED ORDER — PROPOFOL 10 MG/ML IV BOLUS
INTRAVENOUS | Status: AC
  Filled 2015-01-26: qty 20

## 2015-01-26 MED ORDER — ONDANSETRON HCL 4 MG PO TABS: 4.0000 mg | ORAL_TABLET | Freq: Four times a day (QID) | ORAL | Status: DC | PRN

## 2015-01-26 MED ORDER — MECLIZINE HCL 25 MG PO TABS
25.0000 mg | ORAL_TABLET | Freq: Two times a day (BID) | ORAL | Status: DC
  Administered 2015-01-26: 25 mg via ORAL
  Filled 2015-01-26 (×3): qty 1

## 2015-01-26 MED ORDER — 0.9 % SODIUM CHLORIDE (POUR BTL) OPTIME
TOPICAL | Status: DC | PRN
  Administered 2015-01-26: 1000 mL

## 2015-01-26 MED ORDER — EPHEDRINE SULFATE 50 MG/ML IJ SOLN
INTRAMUSCULAR | Status: AC
  Filled 2015-01-26: qty 1

## 2015-01-26 MED ORDER — MIDAZOLAM HCL 2 MG/2ML IJ SOLN
1.0000 mg | Freq: Once | INTRAMUSCULAR | Status: DC
  Filled 2015-01-26: qty 1

## 2015-01-26 MED ORDER — MIDAZOLAM HCL 2 MG/2ML IJ SOLN
INTRAMUSCULAR | Status: AC
Start: 2015-01-26 — End: 2015-01-26
  Filled 2015-01-26: qty 2

## 2015-01-26 MED ORDER — ENOXAPARIN SODIUM 40 MG/0.4ML ~~LOC~~ SOLN
40.0000 mg | SUBCUTANEOUS | Status: DC
  Filled 2015-01-26: qty 0.4

## 2015-01-26 MED ORDER — ONDANSETRON HCL 4 MG/2ML IJ SOLN
INTRAMUSCULAR | Status: DC | PRN
  Administered 2015-01-26: 8 mg via INTRAVENOUS

## 2015-01-26 MED ORDER — LIDOCAINE HCL (CARDIAC) 20 MG/ML IV SOLN
INTRAVENOUS | Status: AC
  Filled 2015-01-26: qty 5

## 2015-01-26 MED ORDER — FENTANYL CITRATE (PF) 250 MCG/5ML IJ SOLN
INTRAMUSCULAR | Status: AC
  Filled 2015-01-26: qty 5

## 2015-01-26 MED ORDER — ONDANSETRON HCL 4 MG/2ML IJ SOLN
4.0000 mg | Freq: Four times a day (QID) | INTRAMUSCULAR | Status: DC | PRN
  Administered 2015-01-26: 4 mg via INTRAVENOUS
  Filled 2015-01-26: qty 2

## 2015-01-26 MED ORDER — MIDAZOLAM HCL 2 MG/2ML IJ SOLN
INTRAMUSCULAR | Status: AC
  Administered 2015-01-26: 1 mg
  Filled 2015-01-26: qty 2

## 2015-01-26 MED ORDER — BUPIVACAINE-EPINEPHRINE (PF) 0.25% -1:200000 IJ SOLN
INTRAMUSCULAR | Status: AC
  Filled 2015-01-26: qty 30

## 2015-01-26 MED ORDER — SODIUM CHLORIDE 0.9 % IJ SOLN
INTRAMUSCULAR | Status: AC
  Filled 2015-01-26: qty 10

## 2015-01-26 MED ORDER — FENTANYL CITRATE (PF) 100 MCG/2ML IJ SOLN
INTRAMUSCULAR | Status: AC
  Administered 2015-01-26: 100 ug
  Filled 2015-01-26: qty 2

## 2015-01-26 MED ORDER — CIPROFLOXACIN IN D5W 400 MG/200ML IV SOLN
INTRAVENOUS | Status: AC
  Administered 2015-01-26: 400 mg via INTRAVENOUS
  Filled 2015-01-26: qty 200

## 2015-01-26 MED ORDER — PROMETHAZINE HCL 25 MG/ML IJ SOLN: 6.2500 mg | INTRAMUSCULAR | Status: DC | PRN

## 2015-01-26 MED ORDER — LIDOCAINE HCL (CARDIAC) 20 MG/ML IV SOLN
INTRAVENOUS | Status: DC | PRN
  Administered 2015-01-26: 30 mg via INTRAVENOUS

## 2015-01-26 MED ORDER — ONDANSETRON HCL 4 MG/2ML IJ SOLN
INTRAMUSCULAR | Status: AC
  Filled 2015-01-26: qty 2

## 2015-01-26 MED ORDER — EPHEDRINE SULFATE 50 MG/ML IJ SOLN
INTRAMUSCULAR | Status: DC | PRN
  Administered 2015-01-26 (×2): 10 mg via INTRAVENOUS

## 2015-01-26 MED ORDER — ALPRAZOLAM 0.25 MG PO TABS
0.2500 mg | ORAL_TABLET | Freq: Two times a day (BID) | ORAL | Status: DC | PRN
  Administered 2015-01-26: 0.25 mg via ORAL
  Filled 2015-01-26: qty 1

## 2015-01-26 MED ORDER — OXYCODONE-ACETAMINOPHEN 5-325 MG PO TABS
1.0000 | ORAL_TABLET | ORAL | Status: DC | PRN
  Administered 2015-01-27: 2 via ORAL
  Filled 2015-01-26: qty 2

## 2015-01-26 MED ORDER — LIDOCAINE-EPINEPHRINE (PF) 1 %-1:200000 IJ SOLN
INTRAMUSCULAR | Status: AC
  Filled 2015-01-26: qty 10

## 2015-01-26 MED ORDER — ARTIFICIAL TEARS OP OINT
TOPICAL_OINTMENT | OPHTHALMIC | Status: AC
  Filled 2015-01-26: qty 3.5

## 2015-01-26 MED ORDER — GLIPIZIDE 5 MG PO TABS
5.0000 mg | ORAL_TABLET | Freq: Two times a day (BID) | ORAL | Status: DC
  Filled 2015-01-26 (×4): qty 1

## 2015-01-26 MED ORDER — LACTATED RINGERS IV SOLN
INTRAVENOUS | Status: DC
  Administered 2015-01-26 (×2): via INTRAVENOUS

## 2015-01-26 MED ORDER — METOPROLOL TARTRATE 50 MG PO TABS
50.0000 mg | ORAL_TABLET | Freq: Two times a day (BID) | ORAL | Status: DC
  Filled 2015-01-26: qty 1

## 2015-01-26 MED ORDER — GLYCOPYRROLATE 0.2 MG/ML IJ SOLN
INTRAMUSCULAR | Status: DC | PRN
  Administered 2015-01-26: 0.2 mg via INTRAVENOUS

## 2015-01-26 MED ORDER — SUCCINYLCHOLINE CHLORIDE 20 MG/ML IJ SOLN
INTRAMUSCULAR | Status: AC
  Filled 2015-01-26: qty 1

## 2015-01-26 MED ORDER — SUCCINYLCHOLINE CHLORIDE 20 MG/ML IJ SOLN
INTRAMUSCULAR | Status: DC | PRN
  Administered 2015-01-26: 140 mg via INTRAVENOUS

## 2015-01-26 MED ORDER — DEXAMETHASONE SODIUM PHOSPHATE 4 MG/ML IJ SOLN
INTRAMUSCULAR | Status: DC | PRN
  Administered 2015-01-26: 4 mg via INTRAVENOUS

## 2015-01-26 MED ORDER — HYDRALAZINE HCL 20 MG/ML IJ SOLN
10.0000 mg | Freq: Four times a day (QID) | INTRAMUSCULAR | Status: DC | PRN
  Administered 2015-01-26: 10 mg via INTRAVENOUS
  Filled 2015-01-26: qty 1

## 2015-01-26 MED ORDER — CHLORHEXIDINE GLUCONATE 4 % EX LIQD: 1.0000 "application " | Freq: Once | CUTANEOUS | Status: DC

## 2015-01-26 MED ORDER — CEFAZOLIN SODIUM-DEXTROSE 2-3 GM-% IV SOLR
2.0000 g | Freq: Three times a day (TID) | INTRAVENOUS | Status: DC
  Administered 2015-01-26 – 2015-01-27 (×2): 2 g via INTRAVENOUS
  Filled 2015-01-26 (×4): qty 50

## 2015-01-26 SURGICAL SUPPLY — 67 items
ADH SKN CLS APL DERMABOND .7 (GAUZE/BANDAGES/DRESSINGS) ×4
APPLIER CLIP 9.375 MED OPEN (MISCELLANEOUS) ×8
APR CLP MED 9.3 20 MLT OPN (MISCELLANEOUS) ×4
BINDER BREAST LRG (GAUZE/BANDAGES/DRESSINGS) IMPLANT
BINDER BREAST XLRG (GAUZE/BANDAGES/DRESSINGS) ×2 IMPLANT
BLADE SURG ROTATE 9660 (MISCELLANEOUS) ×2 IMPLANT
CANISTER SUCTION 2500CC (MISCELLANEOUS) ×4 IMPLANT
CHLORAPREP W/TINT 10.5 ML (MISCELLANEOUS) ×2 IMPLANT
CHLORAPREP W/TINT 26ML (MISCELLANEOUS) ×6 IMPLANT
CLIP APPLIE 9.375 MED OPEN (MISCELLANEOUS) ×2 IMPLANT
COVER SURGICAL LIGHT HANDLE (MISCELLANEOUS) ×4 IMPLANT
DECANTER SPIKE VIAL GLASS SM (MISCELLANEOUS) ×2 IMPLANT
DERMABOND ADVANCED (GAUZE/BANDAGES/DRESSINGS) ×4
DERMABOND ADVANCED .7 DNX12 (GAUZE/BANDAGES/DRESSINGS) ×4 IMPLANT
DEVICE DUBIN SPECIMEN MAMMOGRA (MISCELLANEOUS) ×4 IMPLANT
DRAIN CHANNEL 19F RND (DRAIN) ×4 IMPLANT
DRAPE LAPAROSCOPIC ABDOMINAL (DRAPES) ×2 IMPLANT
DRAPE PED LAPAROTOMY (DRAPES) ×2 IMPLANT
DRAPE UTILITY XL STRL (DRAPES) ×4 IMPLANT
ELECT BLADE 4.0 EZ CLEAN MEGAD (MISCELLANEOUS) ×4
ELECT CAUTERY BLADE 6.4 (BLADE) ×4 IMPLANT
ELECT REM PT RETURN 9FT ADLT (ELECTROSURGICAL) ×4
ELECTRODE BLDE 4.0 EZ CLN MEGD (MISCELLANEOUS) IMPLANT
ELECTRODE REM PT RTRN 9FT ADLT (ELECTROSURGICAL) ×2 IMPLANT
EVACUATOR SILICONE 100CC (DRAIN) ×4 IMPLANT
GAUZE SPONGE 4X4 12PLY STRL (GAUZE/BANDAGES/DRESSINGS) ×4 IMPLANT
GAUZE SPONGE 4X4 16PLY XRAY LF (GAUZE/BANDAGES/DRESSINGS) ×4 IMPLANT
GLOVE BIO SURGEON STRL SZ7.5 (GLOVE) ×2 IMPLANT
GLOVE BIOGEL PI IND STRL 7.0 (GLOVE) IMPLANT
GLOVE BIOGEL PI IND STRL 7.5 (GLOVE) IMPLANT
GLOVE BIOGEL PI INDICATOR 7.0 (GLOVE) ×8
GLOVE BIOGEL PI INDICATOR 7.5 (GLOVE) ×4
GLOVE ECLIPSE 7.5 STRL STRAW (GLOVE) ×2 IMPLANT
GLOVE EUDERMIC 7 POWDERFREE (GLOVE) ×6 IMPLANT
GLOVE SURG SS PI 7.0 STRL IVOR (GLOVE) ×2 IMPLANT
GOWN STRL REUS W/ TWL LRG LVL3 (GOWN DISPOSABLE) ×2 IMPLANT
GOWN STRL REUS W/ TWL XL LVL3 (GOWN DISPOSABLE) ×2 IMPLANT
GOWN STRL REUS W/TWL LRG LVL3 (GOWN DISPOSABLE) ×12
GOWN STRL REUS W/TWL XL LVL3 (GOWN DISPOSABLE) ×8
KIT BASIN OR (CUSTOM PROCEDURE TRAY) ×4 IMPLANT
KIT MARKER MARGIN INK (KITS) ×4 IMPLANT
KIT ROOM TURNOVER OR (KITS) ×4 IMPLANT
MARKER SKIN DUAL TIP RULER LAB (MISCELLANEOUS) ×2 IMPLANT
NDL HYPO 25GX1X1/2 BEV (NEEDLE) ×2 IMPLANT
NEEDLE HYPO 25GX1X1/2 BEV (NEEDLE) ×4 IMPLANT
NS IRRIG 1000ML POUR BTL (IV SOLUTION) ×4 IMPLANT
PACK GENERAL/GYN (CUSTOM PROCEDURE TRAY) ×4 IMPLANT
PACK SURGICAL SETUP 50X90 (CUSTOM PROCEDURE TRAY) ×2 IMPLANT
PAD ARMBOARD 7.5X6 YLW CONV (MISCELLANEOUS) ×4 IMPLANT
PENCIL BUTTON HOLSTER BLD 10FT (ELECTRODE) ×4 IMPLANT
SPECIMEN JAR MEDIUM (MISCELLANEOUS) ×2 IMPLANT
SPONGE LAP 4X18 X RAY DECT (DISPOSABLE) ×4 IMPLANT
SUT ETHILON 2 0 FS 18 (SUTURE) ×4 IMPLANT
SUT MNCRL AB 4-0 PS2 18 (SUTURE) ×8 IMPLANT
SUT SILK 2 0 (SUTURE) ×4
SUT SILK 2 0 SH (SUTURE) ×4 IMPLANT
SUT SILK 2-0 18XBRD TIE 12 (SUTURE) IMPLANT
SUT VIC AB 2-0 SH 18 (SUTURE) ×2 IMPLANT
SUT VIC AB 3-0 SH 18 (SUTURE) ×6 IMPLANT
SUT VICRYL AB 3 0 TIES (SUTURE) ×2 IMPLANT
SYR CONTROL 10ML LL (SYRINGE) ×4 IMPLANT
TAPE CLOTH SURG 6X10 WHT LF (GAUZE/BANDAGES/DRESSINGS) ×2 IMPLANT
TOWEL OR 17X24 6PK STRL BLUE (TOWEL DISPOSABLE) ×2 IMPLANT
TOWEL OR 17X26 10 PK STRL BLUE (TOWEL DISPOSABLE) ×4 IMPLANT
TUBE CONNECTING 12'X1/4 (SUCTIONS)
TUBE CONNECTING 12X1/4 (SUCTIONS) ×2 IMPLANT
YANKAUER SUCT BULB TIP NO VENT (SUCTIONS) ×2 IMPLANT

## 2015-01-26 NOTE — Anesthesia Postprocedure Evaluation (Signed)
  Anesthesia Post-op Note  Patient: Marissa Haney  Procedure(s) Performed: Procedure(s): RIGHT PARTIAL MASTECTOMY WITH DOUBLE WIRE BRACKETED  NEEDLE LOCALIZATION AND RIGHT AXILLARY LYMPH NODE DISSECTION (Right) REMOVAL PORT-A-CATH (Left)  Patient Location: PACU  Anesthesia Type:GA combined with regional for post-op pain  Level of Consciousness: awake, alert , oriented and patient cooperative  Airway and Oxygen Therapy: Patient Spontanous Breathing and Patient connected to nasal cannula oxygen  Post-op Pain: mild  Post-op Assessment: Post-op Vital signs reviewed, Patient's Cardiovascular Status Stable, Respiratory Function Stable, Patent Airway, No signs of Nausea or vomiting and Pain level controlled              Post-op Vital Signs: Reviewed and stable  Last Vitals:  Filed Vitals:   01/26/15 1515  BP: 160/64  Pulse: 48  Temp:   Resp: 16    Complications: No apparent anesthesia complications

## 2015-01-26 NOTE — Anesthesia Procedure Notes (Addendum)
Anesthesia Regional Block:  Pectoralis block  Pre-Anesthetic Checklist: ,, timeout performed, Correct Patient, Correct Site, Correct Laterality, Correct Procedure, Correct Position, site marked, Risks and benefits discussed,  Surgical consent,  Pre-op evaluation,  At surgeon's request and post-op pain management  Laterality: Right  Prep: chloraprep       Needles:  Injection technique: Single-shot     Needle Length: 9cm 9 cm Needle Gauge: 22 and 22 G    Additional Needles:  Procedures: ultrasound guided (picture in chart) Pectoralis block Narrative:  Start time: 01/26/2015 10:40 AM End time: 01/26/2015 10:46 AM Injection made incrementally with aspirations every 5 mL.  Performed by: Personally  Anesthesiologist: Glennon Mac, [place]  Additional Notes: Pt identified in Holding room.  Monitors applied. Working IV access confirmed. Sterile prep and drape R lateral chest wall.  #22ga ECHOgenic needle into subserratus/pec space, ribs 4,5, with US guidance.  30cc 0.5% Bupivacaine with 1:200k epi injected incrementally after negative test dose, good spread of local anesthetic.  Patient asymptomatic, VSS, no heme aspirated, tolerated well.  Jenita Seashore, MD   Procedure Name: Intubation Date/Time: 01/26/2015 11:25 AM Performed by: Barrington Ellison Pre-anesthesia Checklist: Patient identified, Emergency Drugs available, Suction available, Patient being monitored and Timeout performed Patient Re-evaluated:Patient Re-evaluated prior to inductionOxygen Delivery Method: Circle system utilized Preoxygenation: Pre-oxygenation with 100% oxygen Intubation Type: IV induction, Rapid sequence and Cricoid Pressure applied Laryngoscope Size: Glidescope Tube type: Oral Tube size: 7.0 mm Number of attempts: 1 Airway Equipment and Method: Stylet and Video-laryngoscopy Placement Confirmation: ETT inserted through vocal cords under direct vision,  positive ETCO2 and breath sounds checked- equal and  bilateral Secured at: 21 cm Tube secured with: Tape Dental Injury: Teeth and Oropharynx as per pre-operative assessment  Difficulty Due To: Difficult Airway- due to large tongue Future Recommendations: Recommend- induction with short-acting agent, and alternative techniques readily available Comments: First attempt with LMA, unable to obtain good tidal volumes. DL x 1 with Mac 3, Grade IV view, second attempt with Glidescope, grade I view.

## 2015-01-26 NOTE — Progress Notes (Signed)
MD on called paged about patient's BP. RN told to give pain meds due to patient being in pain and then recheck BP.

## 2015-01-26 NOTE — Transfer of Care (Signed)
Immediate Anesthesia Transfer of Care Note  Patient: Conley Canal  Procedure(s) Performed: Procedure(s): RIGHT PARTIAL MASTECTOMY WITH DOUBLE WIRE BRACKETED  NEEDLE LOCALIZATION AND RIGHT AXILLARY LYMPH NODE DISSECTION (Right) REMOVAL PORT-A-CATH (Left)  Patient Location: PACU  Anesthesia Type:General  Level of Consciousness: awake, alert  and oriented  Airway & Oxygen Therapy: Patient Spontanous Breathing and Patient connected to nasal cannula oxygen  Post-op Assessment: Report given to RN  Post vital signs: Reviewed and stable  Last Vitals:  Filed Vitals:   01/26/15 0946  Pulse: 49  Temp: 36.5 C  Resp: 20    Complications: No apparent anesthesia complications

## 2015-01-26 NOTE — Progress Notes (Signed)
Patient arrived via bed. Patient Alert and Oriented X4. VSS with the exception of BP at 240/79. Will notify doctor and continue to monitor patient.

## 2015-01-26 NOTE — Interval H&P Note (Signed)
History and Physical Interval Note:  01/26/2015 10:30 AM  Marissa Haney  has presented today for surgery, with the diagnosis of right breast cancer  The various methods of treatment have been discussed with the patient and family. After consideration of risks, benefits and other options for treatment, the patient has consented to  Procedure(s): RIGHT PARTIAL MASTECTOMY WITH DOUBLE WIRE BRACKETED  NEEDLE LOCALIZATION AND RIGHT AXILLARY LYMPH NODE DISSECTION (Right) REMOVAL PORT-A-CATH (N/A) as a surgical intervention .  The patient's history has been reviewed, patient examined, no change in status, stable for surgery.  I have reviewed the patient's chart and labs.  Questions were answered to the patient's satisfaction.     Adin Hector

## 2015-01-26 NOTE — Op Note (Signed)
Patient Name:           Marissa Haney   Date of Surgery:        01/26/2015  Pre op Diagnosis:      Invasive duct carcinoma right breast, clinical stage T2, N1, M0, (IIB), receptor positive, HER-2 negative Status post neo-adjuvant chemotherapy Status post remote right axillary lymph node biopsy.  Post op Diagnosis:    same  Procedure:                 Removal of Port-A-Cath                                       Extensive right breast lumpectomy with double wire bracketed needle localization                                      Tissue transfer, 13 cm transverse by 8 cm vertical by 4 cm deep                                       Complete right axillary lymph node dissection  Surgeon:                     Edsel Petrin. Dalbert Batman, M.D., FACS  Assistant:                      RNFA  Operative Indications:   This is a pleasant 59 year old [place] American female who returns for surgical decision making regarding her right breast cancer. After lots of discussion, she has decided she would like to keep her breast if possible. We are going to go ahead and schedule her for a right breast partial mastectomy with double wire bracketed needle localization, and complete right axillary lymph node dissection. We had a long talk about this. She is aware that there is a 10-20% chance of positive margins requiring reexcision. We talked about the risks of surgery including bleeding, infection, significant cosmetic deformity. We also talked about the fact that I might find multicentric disease necessitating a mastectomy down the road. She knows that this will be a complete axillary lymph node dissection because of her node-positive disease. She is aware of the risk of arm swelling or numbness. She understands all of this and all of her questions were answered. She agrees with this plan.       History is significant for palpable mass in December 2015. Imaging studies showed a large mass in the right breast  superiorly and abnormal right axillary lymph nodes, both of which were biopsied, both of which showed invasive duct carcinoma, receptor positive, HER-2 negative. I placed a Port-A-Cath and she underwent chemotherapy.      End of treatment MRI shows the tumor is slightly smaller but not much. Original dimensions were 3.4 x 4.6 x 2.7 cm. In the treatment dimensions are 2.3 x 4.9 x 2.7. The left breast shows a small intramammary lymph node. Right axilla looks normal.      She had previously had a right axillary exploration many years ago, which excluded her from the [facility] trial.    Dr. Lindi Adie states that we can take the port out. Marland Kitchen    Operative Findings:  The port was removed from the left infraclavicular area without any difficulty.  The bracketed wires were placed from a craniocaudal orientation which was helpful.  I took the dissection all the way down to the pectoralis fashion, and so the posterior margin is the muscle.  I had to undermine the breast tissue extensively superiorly and inferiorly to partially reconstruct the breast mound.  Because of the tissue defect there was still noticeable volume loss.  This was anticipated.  The right axillary dissection was uneventful, other than the fact the tissues were very scarred and dense from previous actually surgery and from more recent biopsy.  The thoracodorsal neurovascular bundle was preserved and the thoracodorsal nerve was functioning at the end of the case.  I was able to palpate the long thoracic nerve and also appeared to be working to compression.  I did not dissected it out.  Procedure in Detail:          The patient underwent a right  pectoral block by Dr. Glennon Mac.  The patient was taken to the operating room.  General endotracheal anesthesia was induced.  The neck, both chest walls, and the right axilla were prepped and draped in a sterile fashion.  Intravenous antibiotic's were given.  Surgical timeout was performed.  0.5%  Marcaine with epinephrine was used as a local infiltration anesthesia the in the skin and subcutaneous  Tissue.     A transverse incision was made in the left infraclavicular area through the old scar.  Dissection was carried down to the capsule around the port which was incised.  The port was elevated and all 3 proline sutures were cut and removed.  The port and catheter were removed intact.  There was no bleeding.  The subcutaneous tissue was closed with interrupted 3-0 Vicryl sutures and the skin closed with a running subcuticular suture of 4-0 Monocryl and Dermabond.     I reviewed the wire placement in the right breast and the mammographic images.  I performed a curvilinear transverse incision just below the wire insertions.  This turned out to be 12 or 13 cm in length.  Dissection was carried down into the breast tissue and I took the dissection fairly widely in hopes of avoiding a positive margin.  I took the dissection all the way down to the pectoralis fascia.  The specimen was removed and marked with silk sutures and a 6 color ink kit  to orient the pathologist.  Specimen mammogram looked good and it appeared that both wires and the cancer were contained within the specimen with a reasonable margin.       I performed a tissue transfer with the dimensions described above.  I undermined the breast tissue extensively off of the pectoralis major muscle superiorly and inferiorly..  The deeper layers were closed with interrupted sutures of 2-0 vicryl.  The more   superficial layers were closed with interrupted sutures of 2-0 and 3-0 vicryl.   Skin closed with a running subcuticular 4-0 Monocryl and Dermabond.     A transverse incision was made in the  right axilla through the old scar.  Dissection was carried down through the subcutaneous tissue.  The clavipectoral fascia was incised.  There was a lot of scar tissue.  I mobilized the pectoralis major and pectoralis minor's muscles medially.  I carried the  dissection up into the axilla until I identified the axillary vein.  A couple of venous tributaries were divided between metal clips.  Some scar tissue  was dissected.  I dissected the level I and level II lymph nodes down out of the axilla, preserving the thoracodorsal neurovascular bundle.  At least one small sensory nerve was controlled with metal clips and divided.  The axillary contents were then removed and sent to the lab.  Hemostasis was excellent and achieved with electrocautery and metal clips.  The wound was irrigated with saline.  A 19 [place] Blake drain was placed up into the axilla through a separate stab incision and sutured to the skin with a nylon suture.  It was connected to suction bulb.  The clavipectoral fascia was closed with 3-0 vicryl  sutures and the skin closed with a running subcuticular 4-0 Monocryl and Dermabond.    Clean bandages and a breast binder was placed.  The patient tolerated the procedure well was taken to PACU in stable condition.  EBL 30 mL.  Counts correct.  Complications none.              Edsel Petrin. Dalbert Batman, M.D., FACS General and Minimally Invasive Surgery Breast and Colorectal Surgery  01/26/2015 1:00 PM

## 2015-01-26 NOTE — Anesthesia Preprocedure Evaluation (Addendum)
Anesthesia Evaluation  Patient identified by MRN, date of birth, ID band Patient awake    Reviewed: Allergy & Precautions, NPO status , Patient's Chart, lab work & pertinent test results, reviewed documented beta blocker date and time   History of Anesthesia Complications Negative for: history of anesthetic complications  Airway Mallampati: II  TM Distance: >3 FB     Dental  (+) Dental Advisory Given, Poor Dentition   Pulmonary asthma (rarely needs inhaler) ,  breath sounds clear to auscultation        Cardiovascular hypertension, Pt. on home beta blockers and Pt. on medications - anginaRhythm:Regular Rate:Normal  '15 ECHO: LVH with EF 55-60%, mild MR   Neuro/Psych negative neurological ROS     GI/Hepatic negative GI ROS, Neg liver ROS,   Endo/Other  diabetes (glu 132), Type 2Morbid obesity  Renal/GU negative Renal ROS     Musculoskeletal   Abdominal (+) + obese,   Peds  Hematology   Anesthesia Other Findings Breast cancer: s/p chemo  Reproductive/Obstetrics                           Anesthesia Physical Anesthesia Plan  ASA: III  Anesthesia Plan: General   Post-op Pain Management: GA combined w/ Regional for post-op pain   Induction: Intravenous  Airway Management Planned: LMA  Additional Equipment:   Intra-op Plan:   Post-operative Plan:   Informed Consent: I have reviewed the patients History and Physical, chart, labs and discussed the procedure including the risks, benefits and alternatives for the proposed anesthesia with the patient or authorized representative who has indicated his/her understanding and acceptance.   Dental advisory given  Plan Discussed with: CRNA and Surgeon  Anesthesia Plan Comments: (Plan routine monitors, GA- LMA OK, pectoralis block for post op analgesia)        Anesthesia Quick Evaluation

## 2015-01-27 ENCOUNTER — Encounter (HOSPITAL_COMMUNITY): Payer: Self-pay | Admitting: General Surgery

## 2015-01-27 DIAGNOSIS — C50911 Malignant neoplasm of unspecified site of right female breast: Secondary | ICD-10-CM | POA: Diagnosis not present

## 2015-01-27 LAB — HEMOGLOBIN A1C
HEMOGLOBIN A1C: 6.6 % — AB (ref 4.8–5.6)
Mean Plasma Glucose: 143 mg/dL

## 2015-01-27 LAB — GLUCOSE, CAPILLARY: Glucose-Capillary: 138 mg/dL — ABNORMAL HIGH (ref 65–99)

## 2015-01-27 MED ORDER — OXYCODONE-ACETAMINOPHEN 5-325 MG PO TABS: 1.0000 | ORAL_TABLET | ORAL | Status: DC | PRN

## 2015-01-27 NOTE — Discharge Summary (Signed)
Patient ID: Marissa Haney [id number] y.o. 01-Jul-1956  Admit date: 01/26/2015  Discharge date and time: 01/27/2015  Admitting Physician: Adin Hector  Discharge Physician: Adin Hector  Admission Diagnoses: right breast cancer  Discharge Diagnoses: Invasive duct carcinoma right breast, clinical stage T2, N1, M0, (IIB), receptor positive, HER-2 negative Status post neo-adjuvant chemotherapy Status post remote right axillary lymph node biopsy. Diabetes mellitus, type II, non-insulin-dependent Hypertension, fair to poor control Obesity, BMI 37 Remote history TAH and BSO  Operations: Procedure(s): RIGHT PARTIAL MASTECTOMY WITH DOUBLE WIRE BRACKETED  NEEDLE LOCALIZATION AND RIGHT AXILLARY LYMPH NODE DISSECTION REMOVAL PORT-A-CATH  Admission Condition: good  Discharged Condition: good  Indication for Admission: This is a pleasant 59 year old [place] American female who returns for surgical decision making regarding her right breast cancer. After lots of discussion, she has decided she would like to keep her breast if possible. We are going to go ahead and schedule her for a right breast partial mastectomy with double wire bracketed needle localization, and complete right axillary lymph node dissection. We had a long talk about this. She is aware that there is a 10-20% chance of positive margins requiring reexcision. We talked about the risks of surgery including bleeding, infection, significant cosmetic deformity. We also talked about the fact that I might find multicentric disease necessitating a mastectomy down the road. She knows that this will be a complete axillary lymph node dissection because of her node-positive disease. She is aware of the risk of arm swelling or numbness. She understands all of this and all of her questions were answered. She agrees with this plan.   History is significant for palpable mass in December 2015. Imaging studies showed a  large mass in the right breast superiorly and abnormal right axillary lymph nodes, both of which were biopsied, both of which showed invasive duct carcinoma, receptor positive, HER-2 negative. I placed a Port-A-Cath and she underwent chemotherapy.   End of treatment MRI shows the tumor is slightly smaller but not much. Original dimensions were 3.4 x 4.6 x 2.7 cm. In the treatment dimensions are 2.3 x 4.9 x 2.7. The left breast shows a small intramammary lymph node. Right axilla looks normal.   She had previously had a right axillary exploration many years ago, which excluded her from the [facility] trial.  Dr. Lindi Adie states that we can take the port out.   Hospital Course: On the day of admission the patient was evaluated in the holding area.  She had systolic hypertension.  Dr. Glennon Mac of the anesthesia department felt that that's was reasonable to go ahead with surgery.  Her pressure came under good control during anesthesia.     In the operating room we remove the Port-A-Cath, performed a very generous right partial mastectomy with double wire localization, and a complete right axillary lymph node dissection.  The surgery was uneventful.  Please see the operative note for details.    Overnight the patient did well.  Pain was well controlled.  She had no complaints.  Hypertension came under better control during the night.  BP on the morning of discharge was 160/58.  Heart rate 58.  Respirations 18.  SPO2 97%.  Afebrile.  Blood glucose was variable ranging from 132-240.  Controlled with sliding-scale insulin.     The patient was advised of her blood pressure problems and her glucose control.  I felt that it was safe for her to be discharged but I advised her to see her primary care physician in  [place] within the next 24-48 hours for blood pressure check and blood sugar management.  She was strongly advised these need better control.  She stated she would see that her  doctor.     On the morning of discharge the patient's right axilla and right lumpectomy wound and Port-A-Cath removal site all looked good.  No bleeding or hematoma.  She was moving her shoulder around well.  There is no sensory deficit under the right arm.  Drainage was thin and serosanguineous, moderate amount.  I gave her instructions in diet and activities and medical and surgical follow-up.  I told her I need to see her in about one week.  Drain care was discussed.  Wound care was discussed.  She was given a prescription for Percocet for pain.  She was told to continue all of her usual medications.    She is aware that we will call the pathology report to her in 24-48 hours, once it is available.   Consults: None  Significant Diagnostic Studies: Surgical pathology, pending  Treatments: surgery: Removal of Port-A-Cath, right partial mastectomy with double wire localization, right axillary lymph node dissection  Disposition: Home  Patient Instructions:    Medication List    TAKE these medications        ALPRAZolam 0.25 MG tablet  Commonly known as:  XANAX  Take 1 tablet (0.25 mg total) by mouth 2 (two) times daily as needed for anxiety.     glipiZIDE 5 MG tablet  Commonly known as:  GLUCOTROL  Take 5 mg by mouth 2 (two) times daily before a meal.     lisinopril 10 MG tablet  Commonly known as:  PRINIVIL,ZESTRIL  Take 1 tablet (10 mg total) by mouth daily.     LORazepam 0.5 MG tablet  Commonly known as:  ATIVAN  Take 1 tablet (0.5 mg total) by mouth every 6 (six) hours as needed (Nausea or vomiting).     meclizine 25 MG tablet  Commonly known as:  ANTIVERT  Take 1 tablet (25 mg total) by mouth 2 (two) times daily.     metoprolol 50 MG tablet  Commonly known as:  LOPRESSOR  Take 1 tablet (50 mg total) by mouth 2 (two) times daily.     ondansetron 8 MG tablet  Commonly known as:  ZOFRAN  TAKE 1 TABLET BY MOUTH 2 TIMES DAILY AS NEEDED. START ON THE THIRD DAY AFTER  CHEMOTHERAPY.     oxyCODONE-acetaminophen 5-325 MG per tablet  Commonly known as:  PERCOCET/ROXICET  Take 1-2 tablets by mouth every 4 (four) hours as needed for moderate pain.        Activity: Ambulate in-house frequently.  No sports or heavy lifting.  No driving. Diet: diabetic diet Wound Care: as directed  Follow-up:  With Dr. Dalbert Batman  in 1 week.  Signed: Edsel Petrin. Dalbert Batman, M.D., FACS General and minimally invasive surgery Breast and Colorectal Surgery  01/27/2015, 6:30 AM

## 2015-01-27 NOTE — Discharge Instructions (Signed)
See above

## 2015-01-27 NOTE — Progress Notes (Signed)
Discharge paperwork given to patient. Patient educated on drain care and how to change gauze on the incision. Prescription given to patient. IV removed. Patient is ready for discharge.

## 2015-01-27 NOTE — Progress Notes (Signed)
Called Dr. Rosendo Gros about patient's high blood pressure. He gave me an order for hydralizine 10mg . Gave to patient and updated him on follow up blood pressure.

## 2015-02-02 ENCOUNTER — Other Ambulatory Visit: Payer: Self-pay

## 2015-02-02 ENCOUNTER — Ambulatory Visit: Payer: Medicaid Other | Admitting: Hematology and Oncology

## 2015-02-02 NOTE — Assessment & Plan Note (Deleted)
Right breast invasive ductal carcinoma 3.6 cm by MRI with enlarged right axillary lymph node biopsy proven to be positive for cancer , T2 N1 M0 stage 2B clinical stage.ER positive PR positive HER-2 negative Ki-67 22% Chemotherapy summary:Started 07/10/2014 Dose dense Adriamycin and Cytoxan every 2 weeks 4 followed by Taxol weekly 12; Completed 4 cycles of AC; Abraxane completed 9/12 discontinued for toxicities 11/18/2014 Right lumpectomy 01/26/2015: IDC, positive for LVI, 5/12 lymph nodes positive with extracapsular extension, grade 1, multifocal 4 cm, 0.5 cm size E 100%, PR 97%, HER-2 negative, Ki-67 22%, T2 N2 stage IIIa  Pathology counseling: I discussed the final pathology report with the patient and provided her with a copy of this report. Unfortunately she has significant residual disease including large number of positive lymph nodes. This significantly increases the risk of recurrence.   Recommendation: 1. Adjuvant radiation therapy 2. Followed by antiestrogen therapy with anastrozole 1 mg daily (at that time we can discuss participation in [place])  Return to clinic after radiation is complete.

## 2015-02-03 ENCOUNTER — Telehealth: Payer: Self-pay | Admitting: Hematology and Oncology

## 2015-02-03 NOTE — Telephone Encounter (Signed)
lvm for pt regarding to appt on 8.24 mailed pt appt sched and letter

## 2015-02-05 ENCOUNTER — Encounter: Payer: Self-pay | Admitting: Radiation Oncology

## 2015-02-06 ENCOUNTER — Telehealth: Payer: Self-pay | Admitting: *Deleted

## 2015-02-06 NOTE — Telephone Encounter (Signed)
Called patient to inform of [facility] appt. On 02-18-15 - 3:30 pm for the nurse and 4 pm for Dr. Lisbeth Renshaw, spoke with patient and she verified understanding this appt.

## 2015-02-12 ENCOUNTER — Ambulatory Visit: Payer: Medicaid Other | Attending: General Surgery | Admitting: Physical Therapy

## 2015-02-12 DIAGNOSIS — M25511 Pain in right shoulder: Secondary | ICD-10-CM | POA: Diagnosis present

## 2015-02-12 DIAGNOSIS — Z9189 Other specified personal risk factors, not elsewhere classified: Secondary | ICD-10-CM | POA: Diagnosis present

## 2015-02-12 DIAGNOSIS — M25611 Stiffness of right shoulder, not elsewhere classified: Secondary | ICD-10-CM | POA: Insufficient documentation

## 2015-02-12 NOTE — Therapy (Addendum)
[place], [place], [address] Phone: [phone]   Fax:  [phone]  Physical Therapy Evaluation  Patient Details  Name: Marissa Haney MRN: [id number] Date of Birth: [birth year] Referring Provider:  Fanny Skates, MD  Encounter Date: 02/12/2015      PT End of Session - 02/12/15 1135    Visit Number 1   Number of Visits 4   Date for PT Re-Evaluation 04/03/15   PT Start Time 2549   PT Stop Time 1110   PT Time Calculation (min) 52 min   Activity Tolerance Patient tolerated treatment well   Behavior During Therapy [facility] for tasks assessed/performed      Past Medical History  Diagnosis Date  . Hypertension   . Cancer   . Breast cancer     right breast  . Alopecia   . Neuromuscular disorder     neuropathy s/p chemotherapy  . Anxiety   . Family history of adverse reaction to anesthesia     sister's tongue swelled up and could not breathe  . Asthma     only gets inhaler if sick (with flu or cold for example)  . Diabetes mellitus without complication     type 2  . Urinary urgency     Past Surgical History  Procedure Laterality Date  . Abdominal hysterectomy    . Axillary lymph node biopsy    . Colonoscopy    . Portacath placement Left 06/19/2014    Procedure: INSERTION PORT-A-CATH;  Surgeon: Fanny Skates, MD;  Location: [place];  Service: General;  Laterality: Left;  . Dilation and curettage of uterus    . Breast lumpectomy with needle localization and axillary lymph node dissection Right 01/26/2015    Procedure: RIGHT PARTIAL MASTECTOMY WITH DOUBLE WIRE BRACKETED  NEEDLE LOCALIZATION AND RIGHT AXILLARY LYMPH NODE DISSECTION;  Surgeon: Fanny Skates, MD;  Location: [place];  Service: General;  Laterality: Right;  . Port-a-cath removal Left 01/26/2015    Procedure: REMOVAL PORT-A-CATH;  Surgeon: Fanny Skates, MD;  Location: [place];  Service: General;  Laterality: Left;     There were no vitals filed for this visit.  Visit Diagnosis:  Shoulder stiffness, right  At risk for lymphedema  Pain in joint, shoulder region, right      Subjective Assessment - 02/12/15 1037    Subjective "I' think I'm doing pretty good"  She has been doing some light housework    Pertinent History fall on slippery steps Dec 15 resulting in  fractured wrist. Cast for 6 weeks breast cancer with lumpectomy and ALND (12removed , 5 postiive ) January 26, 2015 . She went through chemo first and plans to have radiation in1 late September port has beem removed.   Patient Stated Goals to take my right hand and reach over and touch my left ear.   Currently in Pain? No/denies  not at this moment at rest  has pain with activity    Pain Score 5    Pain Location Breast   Pain Orientation Right   Pain Type Surgical pain            [facility] PT Assessment - 02/12/15 0001    Assessment   Medical Diagnosis breast cancer    Hand Dominance Left   Precautions   Precautions Other (comment)   Precaution Comments cancer    Restrictions   Weight Bearing Restrictions No   Balance Screen   Has the patient fallen in  the past 6 months No   Has the patient had a decrease in activity level because of a fear of falling?  Yes  will not be directly addressed this session    Is the patient reluctant to leave their home because of a fear of falling?  No   Home Social worker Private residence   Living Arrangements Children   Available Help at Discharge Available PRN/intermittently   Type of [place] to enter   Entrance Stairs-Number of Steps 4   Entrance Stairs-[place]ails [place] One level   [place] None   Prior Function   Level of Independence Independent with basic ADLs   Vocation Unemployed   Leisure loves to cook, walking 30 minutes a day    Cognition   Overall Cognitive Status Within Functional Limits for tasks assessed    Observation/Other Assessments   Observations pt obese, she comes in wearing pink elastic post op bandeau. Incisions at top of right breast and right axilla  appear to be healing well with glue intact   Skin Integrity no open areas    Sensation   Light Touch Appears Intact  pt reports intermittent numbness in fingertips from chemo   Coordination   Gross Motor Movements are Fluid and Coordinated Not tested  pt changes position slowly    Posture/Postural Control   Posture/Postural Control Postural limitations   Postural Limitations Rounded Shoulders;Forward head   AROM   Overall AROM Comments pt with guarded acive movement   Right Shoulder Flexion 90 Degrees   Right Shoulder ABduction 77 Degrees   Right Shoulder Internal Rotation 60 Degrees   Right Shoulder External Rotation 32 Degrees   Left Shoulder Flexion 143 Degrees   Left Shoulder ABduction 146 Degrees   Left Shoulder Internal Rotation 65 Degrees   Left Shoulder External Rotation 60 Degrees   Right Wrist Extension 28 Degrees   Right Wrist Flexion 44 Degrees   Left Wrist Extension 51 Degrees   Left Wrist Flexion 70 Degrees   Strength   Overall Strength Comments all right shoulder strength limited by pain    Right Shoulder Flexion 2+/5   Right Shoulder ABduction 2+/5   Right Shoulder External Rotation 2+/5   Left Shoulder Flexion 4/5   Left Shoulder ABduction 4/5   Left Shoulder External Rotation 4/5   Right Wrist Flexion 3/5   Right Wrist Extension 3/5   Left Wrist Flexion 4/5   Left Wrist Extension 4/5           LYMPHEDEMA/ONCOLOGY QUESTIONNAIRE - 02/12/15 1055    Right Upper Extremity Lymphedema   15 cm Proximal to Olecranon Process 40 cm   10 cm Proximal to Olecranon Process 38 cm   Olecranon Process 28.4 cm   15 cm Proximal to Ulnar Styloid Process 29 cm   10 cm Proximal to Ulnar Styloid Process 25.9 cm   Just Proximal to Ulnar Styloid Process 19 cm  increase likely from previous wrist fracture    Across  Hand at [employer] 20.4 cm   At [place] of 2nd Digit 6.5 cm   Left Upper Extremity Lymphedema   15 cm Proximal to Olecranon Process 40.9 cm   10 cm Proximal to Olecranon Process 39 cm   Olecranon Process 29 cm   15 cm Proximal to Ulnar Styloid Process 28 cm   10 cm Proximal to Ulnar Styloid Process 24.4 cm   Just Proximal to Ulnar  Styloid Process 17.5 cm   Across Hand at [employer] 20.5 cm   At [place] of 2nd Digit 6.5 cm           Katina Dung - 02/12/15 0001    Open a tight or new jar Unable   Do heavy household chores (wash walls, wash floors) Moderate difficulty   Carry a shopping bag or briefcase Unable   Wash your back Unable   Use a knife to cut food Severe difficulty   Recreational activities in which you take some force or impact through your arm, shoulder, or hand (golf, hammering, tennis) Unable   During the past week, to what extent has your arm, shoulder or hand problem interfered with your normal social activities with family, friends, neighbors, or groups? Not at all   During the past week, to what extent has your arm, shoulder or hand problem limited your work or other regular daily activities Quite a bit   Arm, shoulder, or hand pain. Moderate   Tingling (pins and needles) in your arm, shoulder, or hand Severe   Difficulty Sleeping Mild difficulty   DASH Score 68.18 %             [facility] Adult PT Treatment/Exercise - 02/12/15 0001    Shoulder Exercises: ROM/Strengthening   Other ROM/Strengthening Exercises dowel rod flexion and external rotation in supine and sitting                PT Education - 02/12/15 1134    Education provided Yes   Education Details beginning shoulder active assisted range of motion   Person(s) Educated Patient   Methods Explanation;Demonstration;Handout;Verbal cues;Tactile cues   Comprehension Verbalized understanding;Returned demonstration;Need further instruction                [facility] Goals -  02/12/15 1151    CC Long Term Goal  #1   Title Patient with verbalize an understanding of lymphedema risk reduction precautions   Baseline no knowledge   Period --  authorization period   Status New   CC Long Term Goal  #2   Title Patient will be independent in a  home exercise program   Baseline no knowledge   Period --  authorizaion period   Status New   CC Long Term Goal  #3   Title Patient will improve right  shoulder abduction to 120 degrees so that she can achieve position needed for radiation therapy.   Baseline 77   Period --  authorizaion period   Status New   CC Long Term Goal  #4   Title Patient will be know how to obtain and use compression garments for lymphedema prophylaxis   Baseline no knowledge   Period --  authorizaion period   Status New            Plan - 02/12/15 1136    Clinical Impression Statement 59 yo female with right breast lumpectomy with significant tissue trasfer per MD report and 12 lymph nodes removed is preparing to have radaiation treatment.  She has significant right shoulder impairment with decreased range of motion and strength and is at risk to develop lymphedema . She has decreased range of motion  and swelling in her wrist from a previous wrist fracture.    Pt will benefit from skilled therapeutic intervention in order to improve on the following deficits Decreased range of motion;Obesity;Impaired UE functional use;Pain;Decreased knowledge of use of DME;Decreased knowledge of precautions;Decreased strength;Increased edema  Rehab Potential Excellent   Clinical Impairments Affecting Rehab Potential previous chemo with fingertip numbness and tingling. previous right wrist fracture   PT Frequency Other (comment)  3 visits   PT Duration Other (comment)  authorization period   PT Treatment/Interventions ADLs/Self Care Home Management;Therapeutic activities;Therapeutic exercise;Manual techniques;DME Instruction;Patient/family  education;Passive range of motion   PT Next Visit Plan AAROM, AROM to right shoulder  Pulleys, wall stretches, Upgrade home exercise plan.  educate about ABC class   Recommended Other Services ABC class    Consulted and Agree with Plan of Care Patient         Problem List Patient Active Problem List   Diagnosis Date Noted  . Malignant neoplasm of upper outer quadrant of female breast 01/26/2015  . Chemotherapy-induced neuropathy 08/26/2014  . Breast cancer of upper-outer quadrant of right female breast 06/11/2014   Donato Heinz. Owens Shark, PT  02/12/2015, 11:59 AM    PHYSICAL THERAPY DISCHARGE SUMMARY  Visits from Start of Care: 1  Current functional level related to goals / functional outcomes: unknown   Remaining deficits: unknown   Education / Equipment: As above  Plan: Patient agrees to discharge.  Patient goals were not met. Patient is being discharged due to not returning since the last visit.  ?????         Maudry Diego, PT 08/12/2015 1:42 PM     [place], [place], [address] Phone: [phone]   Fax:  [phone]

## 2015-02-12 NOTE — Patient Instructions (Signed)
Cane Overhead - Supine  Hold cane at thighs with both hands, extend arms straight over head. Hold _1-2_ seconds. Repeat _5__ times. Do __3_ times per day.  External Rotation (Eccentric), Active-Assist - Supine (Cane)  Lie on back, affected arm out from side, elbow at 90, forearm forward. Use cane to assist in lifting forearm of affected arm to neutral. Slowly lower for 3-5 seconds. _5__ reps per set, ___ sets per day,3  Copyright  VHI. All rights reserved.

## 2015-02-17 NOTE — Progress Notes (Addendum)
Location of Breast Cancer: Follow Up New Consult Right Breast  Upper Outer Quadrant  Histology per Pathology Report:  Dr. Rosealee Albee 01/26/15: Dr. Renelda Loma Ingram,MD 1. Breast, partial mastectomy, right - INVASIVE DUCTAL CARCINOMA, SEE COMMENT. - POSITIVE FOR LYMPHOVASCULAR INVASION. - PREVIOUS BIOPSY SITE IDENTIFIED. - SURGICAL MARGINS, NEGATIVE FOR ATYPIA OR MALIGNANCY. - SEE TUMOR SYNOPTIC TEMPLATE BELOW. 2. Lymph nodes, regional resection, right axillary contents - FIVE LYMPH NODES, POSITIVE FOR METASTATIC MAMMARY CARCINOMA (5/12/). - INTRANODAL TUMOR DEPOSITS ARE 0.8 CM, 1.7 CM, 1.3 CM, 1.1 CM, AND 0.2 CM. - POSITIVE FOR EXTRACAPSULAR TUMOR EXTENSION. (5/12 lymph nodes Positive   Receptor Status: ER( + 100%), PR ( +97% ), Her2-neu (neg KI-67 22%)    Past/Anticipated interventions by surgeon, if any: Dr. Fanny Skates ,MD on  02/03/15 removed jp drain, Referral to Physical therapy right shoulder, f/u 1 month   Past/Anticipated interventions by medical oncology, if any: Chemotherapy :Dr. Lindi Adie appt 02/25/15  Lymphedema issues, if any:   No  Pain issues, if any:  Tenderness right breast, glue on incisions on breast and axilla healing, wearing mesh top secured, cannot raise right arm up , physical therapy stopped due to medicaid  SAFETY ISSUES: NO  Prior radiation? NO  Pacemaker/ICD?  NO  Possible current pregnancy? NO  Is the patient on methotrexate? NO  Current Complaints / other details:  Single 3 children, menses age 4, 62st child birth age 83, no family hx cancer, mother deceased , heart failure and emphysema, father deceased emphysema  Allergies: NKA Rebecca Eaton, RN 02/17/2015,11:35 AM  BP 167/72 mmHg  Pulse 72  Temp(Src) 97.8 F (36.6 C) (Oral)  Resp 20  Ht '5\' 7"'  (1.702 m)  Wt 235 lb 9.6 oz (106.867 kg)  BMI 36.89 kg/m2  Wt Readings from Last 3 Encounters:  02/18/15 235 lb 9.6 oz (106.867 kg)  01/26/15 239 lb (108.41 kg)  01/20/15 239 lb  (108.41 kg)

## 2015-02-18 ENCOUNTER — Ambulatory Visit
Admission: RE | Admit: 2015-02-18 | Discharge: 2015-02-18 | Disposition: A | Discharge status: Home / Self Care | Payer: Medicaid Other | Source: Ambulatory Visit | Attending: Radiation Oncology | Admitting: Radiation Oncology

## 2015-02-18 ENCOUNTER — Encounter: Payer: Self-pay | Admitting: Radiation Oncology

## 2015-02-18 VITALS — BP 167/72 | HR 72 | Temp 97.8°F | Resp 20 | Ht 67.0 in | Wt 235.6 lb

## 2015-02-18 DIAGNOSIS — C50411 Malignant neoplasm of upper-outer quadrant of right female breast: Secondary | ICD-10-CM

## 2015-02-18 DIAGNOSIS — C50911 Malignant neoplasm of unspecified site of right female breast: Secondary | ICD-10-CM | POA: Diagnosis not present

## 2015-02-18 NOTE — Progress Notes (Signed)
Radiation Oncology         [phone] ________________________________  Name: Marissa Haney MRN: [id number]  Date: 02/18/2015  DOB: 1956-06-30  Follow-Up Visit Note  CC: [name], MD  Diagnosis: T2N1 stage IIB Right Breast Cancer of the Upper-Outer Quadrant    ICD-9-CM ICD-10-CM   1. Breast cancer of upper-outer quadrant of right female breast 174.4 C50.411 Ambulatory referral to Social Work    Narrative:  The patient returns today for routine follow-up. Pt states she did well with chemotherapy and is recovering well from lumpectomy of the right breast on 01/26/15. No difficulties with wound healing or infection. She started physical therapy, but is working through an Company secretary with Medicaid. The pt reports tenderness of the right breast and being unable to raise her right arm.  ALLERGIES:  has No Known Allergies.  Meds: Current Outpatient Prescriptions  Medication Sig Dispense Refill  . glipiZIDE (GLUCOTROL) 5 MG tablet Take 5 mg by mouth 2 (two) times daily before a meal.    . HYDROcodone-acetaminophen (NORCO/VICODIN) 5-325 MG per tablet Take 1 tablet by mouth every 6 (six) hours as needed for moderate pain.    Marland Kitchen lisinopril (PRINIVIL,ZESTRIL) 10 MG tablet Take 1 tablet (10 mg total) by mouth daily. 30 tablet 3  . meclizine (ANTIVERT) 25 MG tablet Take 1 tablet (25 mg total) by mouth 2 (two) times daily. 60 tablet 3  . ALPRAZolam (XANAX) 0.25 MG tablet Take 1 tablet (0.25 mg total) by mouth 2 (two) times daily as needed for anxiety. (Patient not taking: Reported on 02/12/2015) 30 tablet 0  . LORazepam (ATIVAN) 0.5 MG tablet Take 1 tablet (0.5 mg total) by mouth every 6 (six) hours as needed (Nausea or vomiting). (Patient not taking: Reported on 12/22/2014) 30 tablet 0  . metoprolol (LOPRESSOR) 50 MG tablet Take 1 tablet (50 mg total) by mouth 2 (two) times daily. (Patient not taking: Reported on 02/12/2015) 60 tablet 1  . ondansetron (ZOFRAN) 8 MG  tablet TAKE 1 TABLET BY MOUTH 2 TIMES DAILY AS NEEDED. START ON THE THIRD DAY AFTER CHEMOTHERAPY. (Patient not taking: Reported on 01/19/2015) 30 tablet 1  . oxyCODONE-acetaminophen (PERCOCET/ROXICET) 5-325 MG per tablet Take 1-2 tablets by mouth every 4 (four) hours as needed for moderate pain. (Patient not taking: Reported on 02/12/2015) 30 tablet 0   No current facility-administered medications for this encounter.    Physical Findings: The patient is in no acute distress. Patient is alert and oriented.  height is '5\' 7"'  (1.702 m) and weight is 235 lb 9.6 oz (106.867 kg). Her oral temperature is 97.8 F (36.6 C). Her blood pressure is 167/72 and her pulse is 72. Her respiration is 20.  Well healing surgical incision on the right breast and axilla.  Lab Findings: Lab Results  Component Value Date   WBC 13.4* 01/26/2015   WBC 6.0 11/18/2014   HGB 13.2 01/26/2015   HGB 12.3 11/18/2014   HCT 39.6 01/26/2015   HCT 36.7 11/18/2014   PLT 189 01/26/2015   PLT 265 11/18/2014    Lab Results  Component Value Date   NA 139 01/20/2015   NA 142 11/18/2014   K 3.3* 01/20/2015   K 3.5 11/18/2014   CHLORIDE 105 11/18/2014   CO2 26 01/20/2015   CO2 26 11/18/2014   GLUCOSE 255* 01/20/2015   GLUCOSE 229* 11/18/2014   BUN 9 01/20/2015   BUN 9.0 11/18/2014   CREATININE 0.82 01/26/2015   CREATININE 0.9 11/18/2014  BILITOT 1.1 01/20/2015   BILITOT 0.57 11/18/2014   ALKPHOS 122 01/20/2015   ALKPHOS 94 11/18/2014   AST 21 01/20/2015   AST 16 11/18/2014   ALT 17 01/20/2015   ALT 15 11/18/2014   PROT 7.5 01/20/2015   PROT 7.0 11/18/2014   ALBUMIN 3.5 01/20/2015   ALBUMIN 3.5 11/18/2014   CALCIUM 8.7* 01/20/2015   CALCIUM 9.1 11/18/2014   ANIONGAP 8 01/20/2015   ANIONGAP 11 11/18/2014    Radiographic Findings: Mm Breast Surgical Specimen  01/26/2015   CLINICAL DATA:  Wire localized lumpectomy of the right breast. The right breast cancer was bracketed with 2 wires prior to lumpectomy.   EXAM: SPECIMEN RADIOGRAPH OF THE RIGHT BREAST  COMPARISON:  Previous exam(s).  FINDINGS: Status post excision of the right breast. The 2 wire tips, mass, and biopsy marker clip are present and are marked for pathology.  IMPRESSION: Specimen radiograph of the right breast.   Electronically Signed   By: Curlene Dolphin M.D.   On: 01/26/2015 14:55   Mm Rt Plc Breast Loc Dev   1st Lesion  Inc Mammo Guide  01/26/2015   CLINICAL DATA:  Right breast cancer diagnosed in November 2015. The patient is status post neoadjuvant therapy. In discussion with Dr. Gillie Manners, we decided to bracket the medial and lateral aspects of the previously biopsied mass with 2 wires, placed the breasts in the CC projection.  EXAM: NEEDLE LOCALIZATION OF THE RIGHT BREAST WITH MAMMO GUIDANCE x 2  COMPARISON:  Previous exams.  FINDINGS: Patient presents for needle localization prior to lumpectomy. I met with the patient and we discussed the procedure of needle localization including benefits and alternatives. We discussed the high likelihood of a successful procedure. We discussed the risks of the procedure, including infection, bleeding, tissue injury, and further surgery. Informed, written consent was given. The usual time-out protocol was performed immediately prior to the procedure.  Using mammographic guidance, sterile technique, 2% lidocaine and a 7 cm modified Kopans needle, the medial aspect of the biopsy-proven malignancy was localized using using a craniocaudal approach.  Using mammographic guidance, sterile technique, 2% lidocaine and a 9 cm modified Kopan's needle, the lateral aspect of the biopsy-proven malignancy was localized using a craniocaudal approach.  The images were marked for Dr. Dalbert Batman.  IMPRESSION: Needle localization with two wires (bracketing) of the right breast. No apparent complications.   Electronically Signed   By: Curlene Dolphin M.D.   On: 01/26/2015 14:53    Impression: Pt is a good candidate for adjuvant  radiation treatment. Pt is doing well after surgery.  Plan: Radiation therapy would be approximately 6 1/2 weeks to the right breast and right supraclavicular region. I explained the process of CT simulation. I discussed possible side effects that may occur; such as skin irritation. I answered any questions that the pt posed regarding radiotherapy. The pt signed a consent form and this was placed in the pt's medical chart.  The patient was seen today for 30 minutes, with the majority of the time spent counseling the patient on his diagnosis of cancer and coordinating his care.   This document serves as a record of services personally performed by Kyung Rudd, MD. It was created on his behalf by Darcus Austin, a trained medical scribe. The creation of this record is based on the scribe's personal observations and the provider's statements to them. This document has been checked and approved by the attending provider.     _____________________________________  Gwynn Burly.  Lisbeth Renshaw, MD, PhD

## 2015-02-18 NOTE — Progress Notes (Signed)
Please see the Nurse Progress Note in the MD Initial Consult Encounter for this patient. 

## 2015-02-19 ENCOUNTER — Encounter: Payer: Self-pay | Admitting: *Deleted

## 2015-02-19 NOTE — Progress Notes (Signed)
[place] Psychosocial Distress Screening Clinical Social Work  Clinical Social Work was referred by distress screening protocol.  The patient scored a 5 on the Psychosocial Distress Thermometer which indicates moderate distress. Clinical Social Worker phoned pt as stated on previous note dated 02/19/15 to address insurance concerns.   ONCBCN DISTRESS SCREENING 02/18/2015  Screening Type Initial Screening  Distress experienced in past week (1-10) 5  Practical problem type Insurance  Emotional problem type   Physician notified of physical symptoms Yes  Referral to clinical social work Yes    Clinical Social Worker follow up needed: No.  If yes, follow up plan:  Loren Racer, [place]  [facility] Phone: [phone] Fax: [phone]

## 2015-02-19 NOTE — Progress Notes (Signed)
[place] Work  Clinical Social Work was referred by patient for assessment of psychosocial needs due to her "medicaid stopping".  Clinical Social Worker  contacted patient at home to offer support and assess for needs. Pt states she found out through [facility] that her MCD has stopped. CSW was able to find out through that her MCD has become inactive. Pt advised to go to [place] to see someone in person. She has left daily messages with no return call to date. Pt educated on [employer] card funds for during radiation as well. Pt plans to go to [place] today or tomorrow to get this sorted out. CSW to follow and continue to advocate for pt on her behalf.   Clinical Social Work interventions: Set designer education  Loren Racer, [place] Worker [place]  [facility] Phone: [phone] Fax: [phone]

## 2015-02-23 ENCOUNTER — Ambulatory Visit: Payer: Medicaid Other | Admitting: Radiation Oncology

## 2015-02-25 ENCOUNTER — Ambulatory Visit: Payer: Medicaid Other | Admitting: Hematology and Oncology

## 2015-02-25 ENCOUNTER — Telehealth: Payer: Self-pay | Admitting: Hematology and Oncology

## 2015-02-25 ENCOUNTER — Ambulatory Visit: Admission: RE | Admit: 2015-02-25 | Payer: Medicaid Other | Source: Ambulatory Visit | Admitting: Radiation Oncology

## 2015-02-25 NOTE — Telephone Encounter (Signed)
Patient came in as she missed her appointment this am and we have rescheduled it and a calendar was printed

## 2015-03-03 ENCOUNTER — Ambulatory Visit (HOSPITAL_BASED_OUTPATIENT_CLINIC_OR_DEPARTMENT_OTHER): Payer: Medicaid Other | Admitting: Hematology and Oncology

## 2015-03-03 ENCOUNTER — Telehealth: Payer: Self-pay | Admitting: Hematology and Oncology

## 2015-03-03 ENCOUNTER — Encounter: Payer: Self-pay | Admitting: Hematology and Oncology

## 2015-03-03 VITALS — BP 162/52 | HR 57 | Temp 98.4°F | Resp 18 | Ht 67.0 in | Wt 242.9 lb

## 2015-03-03 DIAGNOSIS — C773 Secondary and unspecified malignant neoplasm of axilla and upper limb lymph nodes: Secondary | ICD-10-CM | POA: Diagnosis not present

## 2015-03-03 DIAGNOSIS — C50411 Malignant neoplasm of upper-outer quadrant of right female breast: Secondary | ICD-10-CM

## 2015-03-03 DIAGNOSIS — Z170 Estrogen receptor positive status [ER+]: Secondary | ICD-10-CM

## 2015-03-03 DIAGNOSIS — G620 Drug-induced polyneuropathy: Secondary | ICD-10-CM

## 2015-03-03 DIAGNOSIS — T451X5A Adverse effect of antineoplastic and immunosuppressive drugs, initial encounter: Secondary | ICD-10-CM

## 2015-03-03 MED ORDER — ANASTROZOLE 1 MG PO TABS: 1.0000 mg | ORAL_TABLET | Freq: Every day | ORAL | Status: DC

## 2015-03-03 NOTE — Progress Notes (Signed)
Patient Care Team: Cyndi Bender, PA-C as PCP - General (Physician Assistant)  DIAGNOSIS: No matching staging information was found for the patient.  SUMMARY OF ONCOLOGIC HISTORY:   Breast cancer of upper-outer quadrant of right female breast   05/27/2014 Mammogram Right breast mass by mammogram 5.4 cm by ultrasound 4 x 4 X 2 cm   05/28/2014 Initial Biopsy Right breast biopsy 11:30: Invasive ductal carcinoma with lymphovascular invasion grade 1/2, ER 100%, PR 97%, Ki-67 22%, HER-2 negative ratio 1.26 average copy #1.7, lymph node biopsy also positive   06/16/2014 Breast MRI right breast cancer: 3.6 cm at 12:00 position mildly enlarged right axillary lymph node: 5 mm oval enhancing mass in the central left breast could be an intramammary lymph node   07/10/2014 - 11/11/2014 Neo-Adjuvant Chemotherapy Dose dense Adriamycin and Cytoxan 4 followed by Abraxane 12   07/10/2014 Procedure PREVENT clinical trial (Lipitor vs Placebo) in patients undergoing cardiotoxic chemotherapy   12/08/2014 Breast MRI Mass appears smaller, now measuring 2.3 x 4.9 x 2.7 cm. Previously using the same dimensions mass measured 3.4 x 4.6 x 2.7 cm. Improvement of axill LN   01/26/2015 Surgery Right lumpectomy: IDC, positive for LVI, 5/12 lymph nodes positive with extracapsular extension, grade 1, multifocal 4 cm, 0.5 cm size E 100%, PR 97%, HER-2 negative, Ki-67 22%, T2 N2 stage IIIa    CHIEF COMPLIANT: Follow-up after surgery  INTERVAL HISTORY: Latorya I Boucher is a [age]-year-old above-mentioned C right breast cancer with new adjuvant chemotherapy and had lumpectomy. She had extensive lymph node involvement 5 or 12 lymph nodes were positive for cancer. She is still having difficulty with elevation of the arm above her shoulder. Hence radiation is being delayed. She has next appointment with radiation oncology on September 9.  REVIEW OF SYSTEMS:   Constitutional: Denies fevers, chills or abnormal weight loss Eyes: Denies  blurriness of vision Ears, nose, mouth, throat, and face: Denies mucositis or sore throat Respiratory: Denies cough, dyspnea or wheezes Cardiovascular: Denies palpitation, chest discomfort or lower extremity swelling Gastrointestinal:  Denies nausea, heartburn or change in bowel habits Skin: Denies abnormal skin rashes Lymphatics: Denies new lymphadenopathy or easy bruising Neurological Neuropathy in the feet and fingers  Behavioral/Psych: Mood is stable, no new changes  Breast: Slight discomfort under the axilla especially when she lifts her arm or the shoulder  All other systems were reviewed with the patient and are negative.  I have reviewed the past medical history, past surgical history, social history and family history with the patient and they are unchanged from previous note.  ALLERGIES:  has No Known Allergies.  MEDICATIONS:  Current Outpatient Prescriptions  Medication Sig Dispense Refill  . ALPRAZolam (XANAX) 0.25 MG tablet Take 1 tablet (0.25 mg total) by mouth 2 (two) times daily as needed for anxiety. 30 tablet 0  . glipiZIDE (GLUCOTROL) 5 MG tablet Take 5 mg by mouth 2 (two) times daily before a meal.    . lisinopril (PRINIVIL,ZESTRIL) 10 MG tablet Take 1 tablet (10 mg total) by mouth daily. 30 tablet 3  . meclizine (ANTIVERT) 25 MG tablet Take 1 tablet (25 mg total) by mouth 2 (two) times daily. 60 tablet 3  . metoprolol (LOPRESSOR) 50 MG tablet Take 1 tablet (50 mg total) by mouth 2 (two) times daily. 60 tablet 1   No current facility-administered medications for this visit.    PHYSICAL EXAMINATION: ECOG PERFORMANCE STATUS: 1 - Symptomatic but completely ambulatory  Filed Vitals:   03/03/15 1026  BP: 162/52  Pulse: 57  Temp: 98.4 F (36.9 C)  Resp: 18   Filed Weights   03/03/15 1026  Weight: 242 lb 14.4 oz (110.179 kg)    GENERAL:alert, no distress and comfortable SKIN: skin color, texture, turgor are normal, no rashes or significant lesions EYES:  normal, Conjunctiva are pink and non-injected, sclera clear OROPHARYNX:no exudate, no erythema and lips, buccal mucosa, and tongue normal  NECK: supple, thyroid normal size, non-tender, without nodularity LYMPH:  no palpable lymphadenopathy in the cervical, axillary or inguinal LUNGS: clear to auscultation and percussion with normal breathing effort HEART: regular rate & rhythm and no murmurs and no lower extremity edema ABDOMEN:abdomen soft, non-tender and normal bowel sounds Musculoskeletal:no cyanosis of digits and no clubbing  NEURO: alert & oriented x 3 with fluent speech,neuropathy grade 1-2   LABORATORY DATA:  I have reviewed the data as listed   Chemistry      Component Value Date/Time   NA 139 01/20/2015 1045   NA 142 11/18/2014 1406   K 3.3* 01/20/2015 1045   K 3.5 11/18/2014 1406   CL 105 01/20/2015 1045   CO2 26 01/20/2015 1045   CO2 26 11/18/2014 1406   BUN 9 01/20/2015 1045   BUN 9.0 11/18/2014 1406   CREATININE 0.82 01/26/2015 1906   CREATININE 0.9 11/18/2014 1406      Component Value Date/Time   CALCIUM 8.7* 01/20/2015 1045   CALCIUM 9.1 11/18/2014 1406   ALKPHOS 122 01/20/2015 1045   ALKPHOS 94 11/18/2014 1406   AST 21 01/20/2015 1045   AST 16 11/18/2014 1406   ALT 17 01/20/2015 1045   ALT 15 11/18/2014 1406   BILITOT 1.1 01/20/2015 1045   BILITOT 0.57 11/18/2014 1406       Lab Results  Component Value Date   WBC 13.4* 01/26/2015   HGB 13.2 01/26/2015   HCT 39.6 01/26/2015   MCV 94.5 01/26/2015   PLT 189 01/26/2015   NEUTROABS 4.9 01/20/2015   ASSESSMENT & PLAN:  Breast cancer of upper-outer quadrant of right female breast Right breast invasive ductal carcinoma 3.6 cm by MRI with enlarged right axillary lymph node biopsy proven to be positive for cancer , T2 N1 M0 stage 2B clinical stage.ER positive PR positive HER-2 negative Ki-67 22% Right lumpectomy 01/26/2015: IDC, positive for LVI, 5/12 lymph nodes positive with extracapsular extension,  grade 1, multifocal 4 cm, 0.5 cm size E 100%, PR 97%, HER-2 negative, Ki-67 22%, T2 N2 stage IIIa  Pathology counseling: I discussed the final pathology report with the patient and provided her with a copy of this report. Unfortunately she has significant residual disease including large number of positive lymph nodes. This significantly increases the risk of recurrence.   Recommendation: 1. Adjuvant radiation therapy 2. Followed by antiestrogen therapy with anastrozole 1 mg daily   Anastrozole counseling:We discussed the risks and benefits of anti-estrogen therapy with aromatase inhibitors. These include but not limited to insomnia, hot flashes, mood changes, vaginal dryness, bone density loss, and weight gain. Although rare, serious side effects including endometrial cancer, risk of blood clots were also discussed. We strongly believe that the benefits far outweigh the risks. Patient understands these risks and consented to starting treatment. Planned treatment duration is 5 years.  I also discussed with her about participating in [place] clinical trial. She was very interested. Since we are not accruing any clinical trial patient at this time, we will discuss about this when she returns back to see [place] in December.   I  instructed Bruchy to start anastrozole 2 weeks after conclusion of radiation therapy approximately November 15. I will see her back one month after starting antiestrogen therapy.   No orders of the defined types were placed in this encounter.   The patient has a good understanding of the overall plan. she agrees with it. she will call with any problems that may develop before the next visit here.   Rulon Eisenmenger, MD

## 2015-03-03 NOTE — Telephone Encounter (Signed)
Gave avs & calendar for November & December. °

## 2015-03-03 NOTE — Assessment & Plan Note (Signed)
Right breast invasive ductal carcinoma 3.6 cm by MRI with enlarged right axillary lymph node biopsy proven to be positive for cancer , T2 N1 M0 stage 2B clinical stage.ER positive PR positive HER-2 negative Ki-67 22% Right lumpectomy 01/26/2015: IDC, positive for LVI, 5/12 lymph nodes positive with extracapsular extension, grade 1, multifocal 4 cm, 0.5 cm size E 100%, PR 97%, HER-2 negative, Ki-67 22%, T2 N2 stage IIIa  Pathology counseling: I discussed the final pathology report with the patient and provided her with a copy of this report. Unfortunately she has significant residual disease including large number of positive lymph nodes. This significantly increases the risk of recurrence.   Recommendation: 1. Adjuvant radiation therapy 2. Followed by antiestrogen therapy with anastrozole 1 mg daily   Anastrozole counseling:We discussed the risks and benefits of anti-estrogen therapy with aromatase inhibitors. These include but not limited to insomnia, hot flashes, mood changes, vaginal dryness, bone density loss, and weight gain. Although rare, serious side effects including endometrial cancer, risk of blood clots were also discussed. We strongly believe that the benefits far outweigh the risks. Patient understands these risks and consented to starting treatment. Planned treatment duration is 5 years.  I instructed Marissa Haney to start anastrozole 2 weeks after conclusion of radiation therapy. I will see her back one month after starting antiestrogen therapy.

## 2015-03-13 ENCOUNTER — Ambulatory Visit
Admission: RE | Admit: 2015-03-13 | Discharge: 2015-03-13 | Disposition: A | Discharge status: Home / Self Care | Payer: Medicaid Other | Source: Ambulatory Visit | Attending: Radiation Oncology | Admitting: Radiation Oncology

## 2015-03-13 DIAGNOSIS — C50911 Malignant neoplasm of unspecified site of right female breast: Secondary | ICD-10-CM | POA: Diagnosis not present

## 2015-03-13 DIAGNOSIS — C50411 Malignant neoplasm of upper-outer quadrant of right female breast: Secondary | ICD-10-CM

## 2015-03-17 NOTE — Progress Notes (Signed)
  Radiation Oncology         (336) [phone] ________________________________  Name: Marissa Haney MRN: [id number]  Date: 03/13/2015  DOB: 10/25/1955  Diagnosis No matching staging information was found for the patient.   SIMULATION AND TREATMENT PLANNING NOTE  The patient presented for simulation prior to beginning her course of radiation treatment for her diagnosis of right-sided breast cancer. The patient was placed in a supine position on a breast board. A customized vac-lock bag was also constructed and this complex treatment device will be used on a daily basis during her treatment. In this fashion, a CT scan was obtained through the chest area and an isocenter was placed near the chest wall at the upper aspect of the right chest.  The patient will be planned to receive a course of radiation initially to a dose of 50.4 gray. This will consist of a 4 field technique targeting the right chest wall as well as the supraclavicular region. Therefore 2 customized medial and lateral tangent fields have been created targeting the right breast, and also 2 additional customized fields have been designed to treat the supraclavicular region both with a right supraclavicular field and a right posterior axillary boost field. A forward planning/reduced field technique will also be evaluated to determine if this significantly improves the dose homogeneity of the overall plan. Therefore, additional customized blocks/fields may be necessary.  This initial treatment will be accomplished at 1.8 gray per fraction.   The initial plan will consist of a 3-D conformal technique. The target volume, heart and lungs have been contoured and dose volume histograms of each of these structures will be evaluated as part of the 3-D conformal treatment planning process.   It is anticipated that the patient will then receive a 10 gray boost to the surgical scar. This will be accomplished at 2 gray per fraction. The final  anticipated total dose therefore will correspond to 60.4 gray.    _______________________________   Jodelle Gross, MD, PhD

## 2015-03-17 NOTE — Progress Notes (Signed)
  Radiation Oncology         [phone]) [phone] ________________________________  Name: DEZARAY SHIBUYA MRN: [id number]  Date: 03/13/2015  DOB: October 06, 1955  Optical Surface Tracking Plan:  Since intensity modulated radiotherapy (IMRT) and 3D conformal radiation treatment methods are predicated on accurate and precise positioning for treatment, intrafraction motion monitoring is medically necessary to ensure accurate and safe treatment delivery.  The ability to quantify intrafraction motion without excessive ionizing radiation dose can only be performed with optical surface tracking. Accordingly, surface imaging offers the opportunity to obtain 3D measurements of patient position throughout IMRT and 3D treatments without excessive radiation exposure.  I am ordering optical surface tracking for this patient's upcoming course of radiotherapy. ________________________________  Kyung Rudd, MD 03/17/2015 10:10 AM    Reference:   Ursula Alert, J, et al. Surface imaging-based analysis of intrafraction motion for breast radiotherapy patients.Journal of [place], n. 6, nov. 2014. ISSN [id number].   Available at: <http://www.jacmp.org/index.php/jacmp/article/view/4957>.

## 2015-03-18 DIAGNOSIS — C50911 Malignant neoplasm of unspecified site of right female breast: Secondary | ICD-10-CM | POA: Diagnosis not present

## 2015-03-20 ENCOUNTER — Ambulatory Visit: Admission: RE | Admit: 2015-03-20 | Payer: Medicaid Other | Source: Ambulatory Visit

## 2015-03-20 ENCOUNTER — Ambulatory Visit
Admission: RE | Admit: 2015-03-20 | Discharge: 2015-03-20 | Disposition: A | Discharge status: Home / Self Care | Payer: Medicaid Other | Source: Ambulatory Visit | Attending: Radiation Oncology | Admitting: Radiation Oncology

## 2015-03-20 DIAGNOSIS — C50911 Malignant neoplasm of unspecified site of right female breast: Secondary | ICD-10-CM | POA: Diagnosis not present

## 2015-03-23 ENCOUNTER — Ambulatory Visit
Admission: RE | Admit: 2015-03-23 | Discharge: 2015-03-23 | Disposition: A | Discharge status: Home / Self Care | Payer: Medicaid Other | Source: Ambulatory Visit | Attending: Radiation Oncology | Admitting: Radiation Oncology

## 2015-03-23 VITALS — Wt 244.1 lb

## 2015-03-23 DIAGNOSIS — C50411 Malignant neoplasm of upper-outer quadrant of right female breast: Secondary | ICD-10-CM

## 2015-03-23 DIAGNOSIS — C50911 Malignant neoplasm of unspecified site of right female breast: Secondary | ICD-10-CM | POA: Diagnosis not present

## 2015-03-23 MED ORDER — RADIAPLEXRX EX GEL
Freq: Once | CUTANEOUS | Status: AC
  Administered 2015-03-23: 13:00:00 via TOPICAL

## 2015-03-23 MED ORDER — ALRA NON-METALLIC DEODORANT (RAD-ONC)
1.0000 "application " | Freq: Once | TOPICAL | Status: AC
  Administered 2015-03-23: 1 via TOPICAL

## 2015-03-23 NOTE — Progress Notes (Signed)
Pt education done, radiation therapy and you book,my business card, alra and radiaplex gel given to patient,discussed ways to manage side effects,fatigue,skin irritation, pain, swelling tenderness, use radiaplex daily after treatment and bedteime, increase protein in diet, drink plenty fluids, stay hydrated 12:54 PM'

## 2015-03-24 ENCOUNTER — Ambulatory Visit
Admission: RE | Admit: 2015-03-24 | Discharge: 2015-03-24 | Disposition: A | Discharge status: Home / Self Care | Payer: Medicaid Other | Source: Ambulatory Visit | Attending: Radiation Oncology | Admitting: Radiation Oncology

## 2015-03-24 DIAGNOSIS — C50911 Malignant neoplasm of unspecified site of right female breast: Secondary | ICD-10-CM | POA: Diagnosis not present

## 2015-03-25 ENCOUNTER — Ambulatory Visit
Admission: RE | Admit: 2015-03-25 | Discharge: 2015-03-25 | Disposition: A | Discharge status: Home / Self Care | Payer: Medicaid Other | Source: Ambulatory Visit | Attending: Radiation Oncology | Admitting: Radiation Oncology

## 2015-03-25 DIAGNOSIS — C50911 Malignant neoplasm of unspecified site of right female breast: Secondary | ICD-10-CM | POA: Diagnosis not present

## 2015-03-26 ENCOUNTER — Ambulatory Visit
Admission: RE | Admit: 2015-03-26 | Discharge: 2015-03-26 | Disposition: A | Discharge status: Home / Self Care | Payer: Medicaid Other | Source: Ambulatory Visit | Attending: Radiation Oncology | Admitting: Radiation Oncology

## 2015-03-26 DIAGNOSIS — C50911 Malignant neoplasm of unspecified site of right female breast: Secondary | ICD-10-CM | POA: Diagnosis not present

## 2015-03-27 ENCOUNTER — Ambulatory Visit
Admission: RE | Admit: 2015-03-27 | Discharge: 2015-03-27 | Disposition: A | Discharge status: Home / Self Care | Payer: Medicaid Other | Source: Ambulatory Visit | Attending: Radiation Oncology | Admitting: Radiation Oncology

## 2015-03-27 ENCOUNTER — Encounter: Payer: Self-pay | Admitting: Radiation Oncology

## 2015-03-27 VITALS — BP 178/86 | HR 58 | Temp 98.0°F | Resp 20 | Wt 240.9 lb

## 2015-03-27 DIAGNOSIS — C50911 Malignant neoplasm of unspecified site of right female breast: Secondary | ICD-10-CM | POA: Insufficient documentation

## 2015-03-27 DIAGNOSIS — Z510 Encounter for antineoplastic radiation therapy: Secondary | ICD-10-CM | POA: Diagnosis not present

## 2015-03-27 DIAGNOSIS — C50411 Malignant neoplasm of upper-outer quadrant of right female breast: Secondary | ICD-10-CM

## 2015-03-27 NOTE — Progress Notes (Signed)
Weekly rad txs 5/ 33 completd right breast  No skin changes, using radiaplex bid, appetite good  BP 178/86 mmHg  Pulse 58  Temp(Src) 98 F (36.7 C) (Oral)  Resp 20  Wt 240 lb 14.4 oz (109.272 kg)  Wt Readings from Last 3 Encounters:  03/27/15 240 lb 14.4 oz (109.272 kg)  03/23/15 244 lb 1.6 oz (110.723 kg)  03/03/15 242 lb 14.4 oz (110.179 kg)

## 2015-03-27 NOTE — Progress Notes (Signed)
   Department of Radiation Oncology  Phone:  [phone] Fax:        [phone]  Weekly Treatment Note    Name: Marissa Haney Date: 03/27/2015 MRN: [id number] DOB: 1955/08/02   Current dose: 9 Gy  Current fraction:5   MEDICATIONS: Current Outpatient Prescriptions  Medication Sig Dispense Refill  . ALPRAZolam (XANAX) 0.25 MG tablet Take 1 tablet (0.25 mg total) by mouth 2 (two) times daily as needed for anxiety. 30 tablet 0  . glipiZIDE (GLUCOTROL) 5 MG tablet Take 5 mg by mouth 2 (two) times daily before a meal.    . hyaluronate sodium (RADIAPLEXRX) GEL Apply 1 application topically 2 (two) times daily.    Marland Kitchen lisinopril (PRINIVIL,ZESTRIL) 10 MG tablet Take 1 tablet (10 mg total) by mouth daily. 30 tablet 3  . meclizine (ANTIVERT) 25 MG tablet Take 1 tablet (25 mg total) by mouth 2 (two) times daily. 60 tablet 3  . metoprolol (LOPRESSOR) 50 MG tablet Take 1 tablet (50 mg total) by mouth 2 (two) times daily. 60 tablet 1  . non-metallic deodorant (ALRA) MISC Apply 1 application topically daily as needed.    Marland Kitchen anastrozole (ARIMIDEX) 1 MG tablet Take 1 tablet (1 mg total) by mouth daily. Start Nov 15th (Patient not taking: Reported on 03/27/2015) 90 tablet 3   No current facility-administered medications for this encounter.     ALLERGIES: Review of patient's allergies indicates no known allergies.   LABORATORY DATA:  Lab Results  Component Value Date   WBC 13.4* 01/26/2015   HGB 13.2 01/26/2015   HCT 39.6 01/26/2015   MCV 94.5 01/26/2015   PLT 189 01/26/2015   Lab Results  Component Value Date   NA 139 01/20/2015   K 3.3* 01/20/2015   CL 105 01/20/2015   CO2 26 01/20/2015   Lab Results  Component Value Date   ALT 17 01/20/2015   AST 21 01/20/2015   ALKPHOS 122 01/20/2015   BILITOT 1.1 01/20/2015     NARRATIVE: Marissa Haney was seen today for weekly treatment management. The chart was checked and the patient's films were reviewed.  Weekly rad txs 5/ 33  completd right breast  No skin changes, using radiaplex bid, appetite good  BP 178/86 mmHg  Pulse 58  Temp(Src) 98 F (36.7 C) (Oral)  Resp 20  Wt 240 lb 14.4 oz (109.272 kg)  Wt Readings from Last 3 Encounters:  03/27/15 240 lb 14.4 oz (109.272 kg)  03/23/15 244 lb 1.6 oz (110.723 kg)  03/03/15 242 lb 14.4 oz (110.179 kg)    PHYSICAL EXAMINATION: weight is 240 lb 14.4 oz (109.272 kg). Her oral temperature is 98 F (36.7 C). Her blood pressure is 178/86 and her pulse is 58. Her respiration is 20.        ASSESSMENT: The patient is doing satisfactorily with treatment.  PLAN: We will continue with the patient's radiation treatment as planned.

## 2015-03-30 ENCOUNTER — Ambulatory Visit
Admission: RE | Admit: 2015-03-30 | Discharge: 2015-03-30 | Disposition: A | Discharge status: Home / Self Care | Payer: Medicaid Other | Source: Ambulatory Visit | Attending: Radiation Oncology | Admitting: Radiation Oncology

## 2015-03-30 DIAGNOSIS — C50911 Malignant neoplasm of unspecified site of right female breast: Secondary | ICD-10-CM | POA: Diagnosis not present

## 2015-03-31 ENCOUNTER — Ambulatory Visit
Admission: RE | Admit: 2015-03-31 | Discharge: 2015-03-31 | Disposition: A | Discharge status: Home / Self Care | Payer: Medicaid Other | Source: Ambulatory Visit | Attending: Radiation Oncology | Admitting: Radiation Oncology

## 2015-03-31 DIAGNOSIS — C50911 Malignant neoplasm of unspecified site of right female breast: Secondary | ICD-10-CM | POA: Diagnosis not present

## 2015-04-01 ENCOUNTER — Ambulatory Visit
Admission: RE | Admit: 2015-04-01 | Discharge: 2015-04-01 | Disposition: A | Discharge status: Home / Self Care | Payer: Medicaid Other | Source: Ambulatory Visit | Attending: Radiation Oncology | Admitting: Radiation Oncology

## 2015-04-01 DIAGNOSIS — C50911 Malignant neoplasm of unspecified site of right female breast: Secondary | ICD-10-CM | POA: Diagnosis not present

## 2015-04-02 ENCOUNTER — Ambulatory Visit
Admission: RE | Admit: 2015-04-02 | Discharge: 2015-04-02 | Disposition: A | Discharge status: Home / Self Care | Payer: Medicaid Other | Source: Ambulatory Visit | Attending: Radiation Oncology | Admitting: Radiation Oncology

## 2015-04-02 DIAGNOSIS — C50911 Malignant neoplasm of unspecified site of right female breast: Secondary | ICD-10-CM | POA: Diagnosis not present

## 2015-04-03 ENCOUNTER — Encounter: Payer: Self-pay | Admitting: Radiation Oncology

## 2015-04-03 ENCOUNTER — Ambulatory Visit
Admission: RE | Admit: 2015-04-03 | Discharge: 2015-04-03 | Disposition: A | Discharge status: Home / Self Care | Payer: Medicaid Other | Source: Ambulatory Visit | Attending: Radiation Oncology | Admitting: Radiation Oncology

## 2015-04-03 VITALS — BP 186/66 | HR 57 | Temp 98.5°F | Resp 20 | Wt 239.9 lb

## 2015-04-03 DIAGNOSIS — C50411 Malignant neoplasm of upper-outer quadrant of right female breast: Secondary | ICD-10-CM

## 2015-04-03 DIAGNOSIS — C50911 Malignant neoplasm of unspecified site of right female breast: Secondary | ICD-10-CM | POA: Diagnosis not present

## 2015-04-03 NOTE — Progress Notes (Signed)
   Department of Radiation Oncology  Phone:  [phone] Fax:        [phone]  Weekly Treatment Note    Name: Marissa Haney Date: 04/03/2015 MRN: [id number] DOB: 1956-03-05   Current dose: 18 Gy  Current fraction:10   MEDICATIONS: Current Outpatient Prescriptions  Medication Sig Dispense Refill  . ALPRAZolam (XANAX) 0.25 MG tablet Take 1 tablet (0.25 mg total) by mouth 2 (two) times daily as needed for anxiety. 30 tablet 0  . glipiZIDE (GLUCOTROL) 5 MG tablet Take 5 mg by mouth 2 (two) times daily before a meal.    . hyaluronate sodium (RADIAPLEXRX) GEL Apply 1 application topically 2 (two) times daily.    Marland Kitchen lisinopril (PRINIVIL,ZESTRIL) 10 MG tablet Take 1 tablet (10 mg total) by mouth daily. 30 tablet 3  . meclizine (ANTIVERT) 25 MG tablet Take 1 tablet (25 mg total) by mouth 2 (two) times daily. 60 tablet 3  . metoprolol (LOPRESSOR) 50 MG tablet Take 1 tablet (50 mg total) by mouth 2 (two) times daily. 60 tablet 1  . non-metallic deodorant (ALRA) MISC Apply 1 application topically daily as needed.    Marland Kitchen anastrozole (ARIMIDEX) 1 MG tablet Take 1 tablet (1 mg total) by mouth daily. Start Nov 15th (Patient not taking: Reported on 03/27/2015) 90 tablet 3   No current facility-administered medications for this encounter.     ALLERGIES: Review of patient's allergies indicates no known allergies.   LABORATORY DATA:  Lab Results  Component Value Date   WBC 13.4* 01/26/2015   HGB 13.2 01/26/2015   HCT 39.6 01/26/2015   MCV 94.5 01/26/2015   PLT 189 01/26/2015   Lab Results  Component Value Date   NA 139 01/20/2015   K 3.3* 01/20/2015   CL 105 01/20/2015   CO2 26 01/20/2015   Lab Results  Component Value Date   ALT 17 01/20/2015   AST 21 01/20/2015   ALKPHOS 122 01/20/2015   BILITOT 1.1 01/20/2015     NARRATIVE: Marissa Haney was seen today for weekly treatment management. The chart was checked and the patient's films were reviewed.  Weekly rad txs  tight breast 10/33 completed,  Slight tanning, using radiaplex gel bid; some swelling right neck, tightness under right arm, tightness, pain shooting from shoulder down arm  and across breast, asking about lymphedema sleeve, no swelling in right arm noted , asking about breast consent form  Appetite good 11:16 AM BP 186/66 mmHg  Pulse 57  Temp(Src) 98.5 F (36.9 C) (Oral)  Resp 20  Wt 239 lb 14.4 oz (108.818 kg)  Wt Readings from Last 3 Encounters:  04/03/15 239 lb 14.4 oz (108.818 kg)  03/27/15 240 lb 14.4 oz (109.272 kg)  03/23/15 244 lb 1.6 oz (110.723 kg)    PHYSICAL EXAMINATION: weight is 239 lb 14.4 oz (108.818 kg). Her oral temperature is 98.5 F (36.9 C). Her blood pressure is 186/66 and her pulse is 57. Her respiration is 20.      Minimal hyperpigmentation present.  ASSESSMENT: The patient is doing satisfactorily with treatment.  PLAN: We will continue with the patient's radiation treatment as planned.

## 2015-04-03 NOTE — Progress Notes (Signed)
Weekly rad txs tight breast 10/33 completed,  Slight tanning, using radiaplex gel bid; some swelling right neck, tightness under right arm, tightness, pain shooting from shoulder down arm  and across breast, asking about lymphedema sleeve, no swelling in right arm noted , asking about breast consent form  Appetite good 10:48 AM BP 186/66 mmHg  Pulse 57  Temp(Src) 98.5 F (36.9 C) (Oral)  Resp 20  Wt 239 lb 14.4 oz (108.818 kg)  Wt Readings from Last 3 Encounters:  04/03/15 239 lb 14.4 oz (108.818 kg)  03/27/15 240 lb 14.4 oz (109.272 kg)  03/23/15 244 lb 1.6 oz (110.723 kg)

## 2015-04-06 ENCOUNTER — Ambulatory Visit
Admission: RE | Admit: 2015-04-06 | Discharge: 2015-04-06 | Disposition: A | Discharge status: Home / Self Care | Payer: Medicaid Other | Source: Ambulatory Visit | Attending: Radiation Oncology | Admitting: Radiation Oncology

## 2015-04-06 DIAGNOSIS — C50911 Malignant neoplasm of unspecified site of right female breast: Secondary | ICD-10-CM | POA: Diagnosis not present

## 2015-04-06 DIAGNOSIS — Z510 Encounter for antineoplastic radiation therapy: Secondary | ICD-10-CM | POA: Insufficient documentation

## 2015-04-07 ENCOUNTER — Ambulatory Visit
Admission: RE | Admit: 2015-04-07 | Discharge: 2015-04-07 | Disposition: A | Discharge status: Home / Self Care | Payer: Medicaid Other | Source: Ambulatory Visit | Attending: Radiation Oncology | Admitting: Radiation Oncology

## 2015-04-07 DIAGNOSIS — C50911 Malignant neoplasm of unspecified site of right female breast: Secondary | ICD-10-CM | POA: Diagnosis not present

## 2015-04-08 ENCOUNTER — Ambulatory Visit
Admission: RE | Admit: 2015-04-08 | Discharge: 2015-04-08 | Disposition: A | Discharge status: Home / Self Care | Payer: Medicaid Other | Source: Ambulatory Visit | Attending: Radiation Oncology | Admitting: Radiation Oncology

## 2015-04-08 ENCOUNTER — Encounter: Payer: Self-pay | Admitting: Radiation Oncology

## 2015-04-08 VITALS — BP 197/78 | HR 58 | Temp 97.8°F | Wt 246.8 lb

## 2015-04-08 DIAGNOSIS — C50411 Malignant neoplasm of upper-outer quadrant of right female breast: Secondary | ICD-10-CM

## 2015-04-08 DIAGNOSIS — C50911 Malignant neoplasm of unspecified site of right female breast: Secondary | ICD-10-CM | POA: Diagnosis not present

## 2015-04-08 MED ORDER — RADIAPLEXRX EX GEL
Freq: Once | CUTANEOUS | Status: AC
  Administered 2015-04-08: 11:00:00 via TOPICAL

## 2015-04-08 NOTE — Progress Notes (Signed)
   Department of Radiation Oncology  Phone:  [phone] Fax:        [phone]  Weekly Treatment Note    Name: [name] Date: 04/08/2015 MRN: [id number] DOB: 1956-02-20   Current dose: 23.4 Gy  Current fraction:13   MEDICATIONS: Current Outpatient Prescriptions  Medication Sig Dispense Refill  . ALPRAZolam (XANAX) 0.25 MG tablet Take 1 tablet (0.25 mg total) by mouth 2 (two) times daily as needed for anxiety. 30 tablet 0  . glipiZIDE (GLUCOTROL) 5 MG tablet Take 5 mg by mouth 2 (two) times daily before a meal.    . hyaluronate sodium (RADIAPLEXRX) GEL Apply 1 application topically 2 (two) times daily.    Marland Kitchen lisinopril (PRINIVIL,ZESTRIL) 10 MG tablet Take 1 tablet (10 mg total) by mouth daily. 30 tablet 3  . meclizine (ANTIVERT) 25 MG tablet Take 1 tablet (25 mg total) by mouth 2 (two) times daily. 60 tablet 3  . metoprolol (LOPRESSOR) 50 MG tablet Take 1 tablet (50 mg total) by mouth 2 (two) times daily. 60 tablet 1  . anastrozole (ARIMIDEX) 1 MG tablet Take 1 tablet (1 mg total) by mouth daily. Start Nov 15th (Patient not taking: Reported on 03/27/2015) 90 tablet 3  . non-metallic deodorant (ALRA) MISC Apply 1 application topically daily as needed.     No current facility-administered medications for this encounter.     ALLERGIES: Review of patient's allergies indicates no known allergies.   LABORATORY DATA:  Lab Results  Component Value Date   WBC 13.4* 01/26/2015   HGB 13.2 01/26/2015   HCT 39.6 01/26/2015   MCV 94.5 01/26/2015   PLT 189 01/26/2015   Lab Results  Component Value Date   NA 139 01/20/2015   K 3.3* 01/20/2015   CL 105 01/20/2015   CO2 26 01/20/2015   Lab Results  Component Value Date   ALT 17 01/20/2015   AST 21 01/20/2015   ALKPHOS 122 01/20/2015   BILITOT 1.1 01/20/2015     NARRATIVE: Marissa Haney was seen today for weekly treatment management. Marissa chart was checked and Marissa patient's films were reviewed.  Weekly rad txs  rt breast, 13/33 completed, slight tanning, skin intact, using radiaplex bid, gave another tube per request, appetite good, no c/o pain, hasn't taken b/p med as yet, waiting to leave and eat breakfast today.   PHYSICAL EXAMINATION: weight is 246 lb 12.8 oz (111.948 kg). Her oral temperature is 97.8 F (36.6 C). Her blood pressure is 197/78 and her pulse is 58.     Right breast with mild hyperpigmentation with no desquamation.   ASSESSMENT: Marissa patient is doing satisfactorily with treatment.  PLAN: We will continue with Marissa patient's radiation treatment as planned.   ------------------------------------------------  Jodelle Gross, MD, PhD   This document serves as a record of services personally performed by Kyung Rudd, MD. It was created on his behalf by Derek Mound, a trained medical scribe. Marissa creation of this record is based on Marissa scribe's personal observations and Marissa provider's statements to them. This document has been checked and approved by Marissa attending provider.

## 2015-04-08 NOTE — Progress Notes (Signed)
Weekly rad txs rt breast, 13/33 completed, slight tanning, skin intact, using radiaplex bid, gave another tube per request, appetite good, no c/o pain, ,hasn't taken b/p med as yet , waiting to leave and eat breakfast  today 10:37 AM BP 197/78 mmHg  Pulse 58  Temp(Src) 97.8 F (36.6 C) (Oral)  Wt 246 lb 12.8 oz (111.948 kg)  Wt Readings from Last 3 Encounters:  04/08/15 246 lb 12.8 oz (111.948 kg)  04/03/15 239 lb 14.4 oz (108.818 kg)  03/27/15 240 lb 14.4 oz (109.272 kg)

## 2015-04-09 ENCOUNTER — Ambulatory Visit
Admission: RE | Admit: 2015-04-09 | Discharge: 2015-04-09 | Disposition: A | Discharge status: Home / Self Care | Payer: Medicaid Other | Source: Ambulatory Visit | Attending: Radiation Oncology | Admitting: Radiation Oncology

## 2015-04-09 DIAGNOSIS — C50911 Malignant neoplasm of unspecified site of right female breast: Secondary | ICD-10-CM | POA: Diagnosis not present

## 2015-04-10 ENCOUNTER — Ambulatory Visit
Admission: RE | Admit: 2015-04-10 | Discharge: 2015-04-10 | Disposition: A | Discharge status: Home / Self Care | Payer: Medicaid Other | Source: Ambulatory Visit | Attending: Radiation Oncology | Admitting: Radiation Oncology

## 2015-04-10 ENCOUNTER — Other Ambulatory Visit: Payer: Self-pay

## 2015-04-10 DIAGNOSIS — C50911 Malignant neoplasm of unspecified site of right female breast: Secondary | ICD-10-CM | POA: Diagnosis not present

## 2015-04-13 ENCOUNTER — Other Ambulatory Visit: Payer: Self-pay | Admitting: *Deleted

## 2015-04-13 ENCOUNTER — Ambulatory Visit
Admission: RE | Admit: 2015-04-13 | Discharge: 2015-04-13 | Disposition: A | Discharge status: Home / Self Care | Payer: Medicaid Other | Source: Ambulatory Visit | Attending: Radiation Oncology | Admitting: Radiation Oncology

## 2015-04-13 DIAGNOSIS — C50911 Malignant neoplasm of unspecified site of right female breast: Secondary | ICD-10-CM | POA: Diagnosis not present

## 2015-04-13 DIAGNOSIS — C50411 Malignant neoplasm of upper-outer quadrant of right female breast: Secondary | ICD-10-CM

## 2015-04-13 MED ORDER — MECLIZINE HCL 25 MG PO TABS
25.0000 mg | ORAL_TABLET | Freq: Two times a day (BID) | ORAL | Status: DC
Start: 2015-04-13 — End: 2017-05-17

## 2015-04-14 ENCOUNTER — Ambulatory Visit
Admission: RE | Admit: 2015-04-14 | Discharge: 2015-04-14 | Disposition: A | Discharge status: Home / Self Care | Payer: Medicaid Other | Source: Ambulatory Visit | Attending: Radiation Oncology | Admitting: Radiation Oncology

## 2015-04-14 DIAGNOSIS — C50911 Malignant neoplasm of unspecified site of right female breast: Secondary | ICD-10-CM | POA: Diagnosis not present

## 2015-04-15 ENCOUNTER — Ambulatory Visit
Admission: RE | Admit: 2015-04-15 | Discharge: 2015-04-15 | Disposition: A | Discharge status: Home / Self Care | Payer: Medicaid Other | Source: Ambulatory Visit | Attending: Radiation Oncology | Admitting: Radiation Oncology

## 2015-04-15 DIAGNOSIS — C50911 Malignant neoplasm of unspecified site of right female breast: Secondary | ICD-10-CM | POA: Diagnosis not present

## 2015-04-16 ENCOUNTER — Ambulatory Visit
Admission: RE | Admit: 2015-04-16 | Discharge: 2015-04-16 | Disposition: A | Discharge status: Home / Self Care | Payer: Medicaid Other | Source: Ambulatory Visit | Attending: Radiation Oncology | Admitting: Radiation Oncology

## 2015-04-16 DIAGNOSIS — C50911 Malignant neoplasm of unspecified site of right female breast: Secondary | ICD-10-CM | POA: Diagnosis not present

## 2015-04-17 ENCOUNTER — Encounter: Payer: Self-pay | Admitting: Radiation Oncology

## 2015-04-17 ENCOUNTER — Ambulatory Visit
Admission: RE | Admit: 2015-04-17 | Discharge: 2015-04-17 | Disposition: A | Discharge status: Home / Self Care | Payer: Medicaid Other | Source: Ambulatory Visit | Attending: Radiation Oncology | Admitting: Radiation Oncology

## 2015-04-17 VITALS — BP 179/70 | HR 61 | Temp 98.6°F | Resp 16 | Ht 67.0 in | Wt 241.6 lb

## 2015-04-17 DIAGNOSIS — C50911 Malignant neoplasm of unspecified site of right female breast: Secondary | ICD-10-CM | POA: Diagnosis not present

## 2015-04-17 DIAGNOSIS — C50411 Malignant neoplasm of upper-outer quadrant of right female breast: Secondary | ICD-10-CM

## 2015-04-17 NOTE — Progress Notes (Addendum)
Marissa Haney has completed 20 fractions to her right breast.  She reports having burning pain in her right breast.  She reports having fatigue in the afternoons.  She reports having headaches a few days this week.  The skin on her right breast/underarm is red with hyperpigmenation.  She has been giving hydrogel pads to use under her arms for the burning.  BP 179/70 mmHg  Pulse 61  Temp(Src) 98.6 F (37 C) (Oral)  Resp 16  Ht 5\' 7"  (1.702 m)  Wt 241 lb 9.6 oz (109.589 kg)  BMI 37.83 kg/m2

## 2015-04-17 NOTE — Progress Notes (Signed)
   Department of Radiation Oncology  Phone:  [phone] Fax:        [phone]  Weekly Treatment Note    Name: Marissa Haney Date: 04/17/2015 MRN: [id number] DOB: 12/09/1955   Current dose: 36 Gy  Current fraction:20   MEDICATIONS: Current Outpatient Prescriptions  Medication Sig Dispense Refill  . glipiZIDE (GLUCOTROL) 5 MG tablet Take 5 mg by mouth 2 (two) times daily before a meal.    . hyaluronate sodium (RADIAPLEXRX) GEL Apply 1 application topically 2 (two) times daily.    Marland Kitchen lisinopril (PRINIVIL,ZESTRIL) 10 MG tablet Take 1 tablet (10 mg total) by mouth daily. 30 tablet 3  . meclizine (ANTIVERT) 25 MG tablet Take 1 tablet (25 mg total) by mouth 2 (two) times daily. 60 tablet 3  . non-metallic deodorant (ALRA) MISC Apply 1 application topically daily as needed.    . ALPRAZolam (XANAX) 0.25 MG tablet Take 1 tablet (0.25 mg total) by mouth 2 (two) times daily as needed for anxiety. (Patient not taking: Reported on 04/17/2015) 30 tablet 0  . anastrozole (ARIMIDEX) 1 MG tablet Take 1 tablet (1 mg total) by mouth daily. Start Nov 15th (Patient not taking: Reported on 03/27/2015) 90 tablet 3  . metoprolol (LOPRESSOR) 50 MG tablet Take 1 tablet (50 mg total) by mouth 2 (two) times daily. (Patient not taking: Reported on 04/17/2015) 60 tablet 1   No current facility-administered medications for this encounter.     ALLERGIES: Review of patient's allergies indicates no known allergies.   LABORATORY DATA:  Lab Results  Component Value Date   WBC 13.4* 01/26/2015   HGB 13.2 01/26/2015   HCT 39.6 01/26/2015   MCV 94.5 01/26/2015   PLT 189 01/26/2015   Lab Results  Component Value Date   NA 139 01/20/2015   K 3.3* 01/20/2015   CL 105 01/20/2015   CO2 26 01/20/2015   Lab Results  Component Value Date   ALT 17 01/20/2015   AST 21 01/20/2015   ALKPHOS 122 01/20/2015   BILITOT 1.1 01/20/2015     NARRATIVE: Marissa Haney was seen today for weekly treatment  management. The chart was checked and the patient's films were reviewed.  Marissa Haney has completed 20 fractions to her right breast.  She reports having burning pain in her right breast.  She reports having fatigue in the afternoons.  She reports having headaches a few days this week.  The skin on her right breast/underarm is red with hyperpigmenation.  She has been giving hydrogel pads to use under her arms for the burning.  BP 179/70 mmHg  Pulse 61  Temp(Src) 98.6 F (37 C) (Oral)  Resp 16  Ht 5\' 7"  (1.702 m)  Wt 241 lb 9.6 oz (109.589 kg)  BMI 37.83 kg/m2  PHYSICAL EXAMINATION: height is 5\' 7"  (1.702 m) and weight is 241 lb 9.6 oz (109.589 kg). Her oral temperature is 98.6 F (37 C). Her blood pressure is 179/70 and her pulse is 61. Her respiration is 16.      The patient's skin looks quite good although she is beginning to have some dry desquamation in the inframammary region.  ASSESSMENT: The patient is doing satisfactorily with treatment.  PLAN: We will continue with the patient's radiation treatment as planned. The patient will begin using Neosporin underneath the breast.

## 2015-04-20 ENCOUNTER — Ambulatory Visit
Admission: RE | Admit: 2015-04-20 | Discharge: 2015-04-20 | Disposition: A | Discharge status: Home / Self Care | Payer: Medicaid Other | Source: Ambulatory Visit | Attending: Radiation Oncology | Admitting: Radiation Oncology

## 2015-04-20 DIAGNOSIS — C50911 Malignant neoplasm of unspecified site of right female breast: Secondary | ICD-10-CM | POA: Diagnosis not present

## 2015-04-21 ENCOUNTER — Ambulatory Visit
Admission: RE | Admit: 2015-04-21 | Discharge: 2015-04-21 | Disposition: A | Discharge status: Home / Self Care | Payer: Medicaid Other | Source: Ambulatory Visit | Attending: Radiation Oncology | Admitting: Radiation Oncology

## 2015-04-21 DIAGNOSIS — C50911 Malignant neoplasm of unspecified site of right female breast: Secondary | ICD-10-CM | POA: Diagnosis not present

## 2015-04-22 ENCOUNTER — Ambulatory Visit
Admission: RE | Admit: 2015-04-22 | Discharge: 2015-04-22 | Disposition: A | Discharge status: Home / Self Care | Payer: Medicaid Other | Source: Ambulatory Visit | Attending: Radiation Oncology | Admitting: Radiation Oncology

## 2015-04-22 DIAGNOSIS — C50911 Malignant neoplasm of unspecified site of right female breast: Secondary | ICD-10-CM | POA: Diagnosis not present

## 2015-04-23 ENCOUNTER — Ambulatory Visit
Admission: RE | Admit: 2015-04-23 | Discharge: 2015-04-23 | Disposition: A | Discharge status: Home / Self Care | Payer: Medicaid Other | Source: Ambulatory Visit | Attending: Radiation Oncology | Admitting: Radiation Oncology

## 2015-04-23 DIAGNOSIS — C50911 Malignant neoplasm of unspecified site of right female breast: Secondary | ICD-10-CM | POA: Diagnosis not present

## 2015-04-24 ENCOUNTER — Ambulatory Visit
Admission: RE | Admit: 2015-04-24 | Discharge: 2015-04-24 | Disposition: A | Discharge status: Home / Self Care | Payer: Medicaid Other | Source: Ambulatory Visit | Attending: Radiation Oncology | Admitting: Radiation Oncology

## 2015-04-24 ENCOUNTER — Ambulatory Visit: Payer: Medicaid Other | Admitting: Radiation Oncology

## 2015-04-24 DIAGNOSIS — C50911 Malignant neoplasm of unspecified site of right female breast: Secondary | ICD-10-CM | POA: Diagnosis not present

## 2015-04-24 DIAGNOSIS — C50411 Malignant neoplasm of upper-outer quadrant of right female breast: Secondary | ICD-10-CM

## 2015-04-24 NOTE — Progress Notes (Signed)
   Department of Radiation Oncology  Phone:  [phone] Fax:        [phone]  Weekly Treatment Note    Name: Marissa Haney Date: 04/24/2015 MRN: [id number] DOB: Nov 15, 1955   Current dose: 45 Gy  Current fraction: 25   MEDICATIONS: Current Outpatient Prescriptions  Medication Sig Dispense Refill  . glipiZIDE (GLUCOTROL) 5 MG tablet Take 5 mg by mouth 2 (two) times daily before a meal.    . hyaluronate sodium (RADIAPLEXRX) GEL Apply 1 application topically 2 (two) times daily.    Marland Kitchen lisinopril (PRINIVIL,ZESTRIL) 10 MG tablet Take 1 tablet (10 mg total) by mouth daily. 30 tablet 3  . meclizine (ANTIVERT) 25 MG tablet Take 1 tablet (25 mg total) by mouth 2 (two) times daily. 60 tablet 3  . metoprolol (LOPRESSOR) 50 MG tablet Take 1 tablet (50 mg total) by mouth 2 (two) times daily. 60 tablet 1  . non-metallic deodorant (ALRA) MISC Apply 1 application topically daily as needed.    . ALPRAZolam (XANAX) 0.25 MG tablet Take 1 tablet (0.25 mg total) by mouth 2 (two) times daily as needed for anxiety. (Patient not taking: Reported on 04/17/2015) 30 tablet 0  . anastrozole (ARIMIDEX) 1 MG tablet Take 1 tablet (1 mg total) by mouth daily. Start Nov 15th (Patient not taking: Reported on 03/27/2015) 90 tablet 3   No current facility-administered medications for this encounter.     ALLERGIES: Review of patient's allergies indicates no known allergies.   LABORATORY DATA:  Lab Results  Component Value Date   WBC 13.4* 01/26/2015   HGB 13.2 01/26/2015   HCT 39.6 01/26/2015   MCV 94.5 01/26/2015   PLT 189 01/26/2015   Lab Results  Component Value Date   NA 139 01/20/2015   K 3.3* 01/20/2015   CL 105 01/20/2015   CO2 26 01/20/2015   Lab Results  Component Value Date   ALT 17 01/20/2015   AST 21 01/20/2015   ALKPHOS 122 01/20/2015   BILITOT 1.1 01/20/2015     NARRATIVE: Marissa Haney was seen today for weekly treatment management. The chart was checked and the  patient's films were reviewed.  Weekly radiation treatments to right breast, 25/33 completed. Dry desquamation under axilla and inframmary fold, and on subclavicular region tanning, Painful. Using neosporin on those areas. Radiaplex elsewhere on her breast, tanning. Appetite, Energy level mild fatigue. Drinking plenty water. Neosporin samples given. Patient is still using hydrogel pads.   There were no vitals taken for this visit.  PHYSICAL EXAMINATION: vitals were not taken for this visit.     Dry desquamation underneath the right arm and in the inframammary region. No moist desquamation.  ASSESSMENT: The patient is doing satisfactorily with treatment.   PLAN: We will continue with the patient's radiation treatment as planned.   This document serves as a record of services personally performed by Kyung Rudd, MD. It was created on his behalf by Arlyce Harman, a trained medical scribe. The creation of this record is based on the scribe's personal observations and the provider's statements to them. This document has been checked and approved by the attending provider. ------------------------------------------------  Jodelle Gross, MD, PhD

## 2015-04-24 NOTE — Progress Notes (Addendum)
wekly rad trxs right breast 25/33 completed, dry desquamation under axilla and inframmary fold,  And on subclavicular region tanning,  Painful,  Using neosporin ion those areas, radiaplex elsewhere on her breast, tanning,appetite  Energy level mild fatigue, drinking plenty water,  Neosporin samples given ,still using hydrogel pads 10:44 AM There were no vitals taken for this visit.  Wt Readings from Last 3 Encounters:  04/17/15 241 lb 9.6 oz (109.589 kg)  04/08/15 246 lb 12.8 oz (111.948 kg)  04/03/15 239 lb 14.4 oz (108.818 kg)

## 2015-04-27 ENCOUNTER — Ambulatory Visit
Admission: RE | Admit: 2015-04-27 | Discharge: 2015-04-27 | Disposition: A | Discharge status: Home / Self Care | Payer: Medicaid Other | Source: Ambulatory Visit | Attending: Radiation Oncology | Admitting: Radiation Oncology

## 2015-04-27 DIAGNOSIS — C50911 Malignant neoplasm of unspecified site of right female breast: Secondary | ICD-10-CM | POA: Diagnosis not present

## 2015-04-27 DIAGNOSIS — C50411 Malignant neoplasm of upper-outer quadrant of right female breast: Secondary | ICD-10-CM

## 2015-04-27 MED ORDER — RADIAPLEXRX EX GEL
Freq: Once | CUTANEOUS | Status: AC
  Administered 2015-04-27: 11:00:00 via TOPICAL

## 2015-04-27 NOTE — Progress Notes (Signed)
Patient asked for anoother radaiplex cream ran out, c/o skin soreness more and darkening of skin , gave 3rd tube radaiplex since started  Radiation 10:46 AM

## 2015-04-28 ENCOUNTER — Ambulatory Visit
Admission: RE | Admit: 2015-04-28 | Discharge: 2015-04-28 | Disposition: A | Discharge status: Home / Self Care | Payer: Medicaid Other | Source: Ambulatory Visit | Attending: Radiation Oncology | Admitting: Radiation Oncology

## 2015-04-28 DIAGNOSIS — C50911 Malignant neoplasm of unspecified site of right female breast: Secondary | ICD-10-CM | POA: Diagnosis not present

## 2015-04-29 ENCOUNTER — Ambulatory Visit
Admission: RE | Admit: 2015-04-29 | Discharge: 2015-04-29 | Disposition: A | Discharge status: Home / Self Care | Payer: Medicaid Other | Source: Ambulatory Visit | Attending: Radiation Oncology | Admitting: Radiation Oncology

## 2015-04-29 DIAGNOSIS — C50911 Malignant neoplasm of unspecified site of right female breast: Secondary | ICD-10-CM | POA: Diagnosis not present

## 2015-04-30 ENCOUNTER — Ambulatory Visit
Admission: RE | Admit: 2015-04-30 | Discharge: 2015-04-30 | Disposition: A | Discharge status: Home / Self Care | Payer: Medicaid Other | Source: Ambulatory Visit | Attending: Radiation Oncology | Admitting: Radiation Oncology

## 2015-04-30 DIAGNOSIS — C50911 Malignant neoplasm of unspecified site of right female breast: Secondary | ICD-10-CM | POA: Diagnosis not present

## 2015-05-01 ENCOUNTER — Ambulatory Visit
Admission: RE | Admit: 2015-05-01 | Discharge: 2015-05-01 | Disposition: A | Discharge status: Home / Self Care | Payer: Medicaid Other | Source: Ambulatory Visit | Attending: Radiation Oncology | Admitting: Radiation Oncology

## 2015-05-01 ENCOUNTER — Encounter: Payer: Self-pay | Admitting: Radiation Oncology

## 2015-05-01 VITALS — BP 147/65 | HR 66 | Temp 97.9°F | Resp 16 | Wt 239.7 lb

## 2015-05-01 DIAGNOSIS — C50411 Malignant neoplasm of upper-outer quadrant of right female breast: Secondary | ICD-10-CM

## 2015-05-01 DIAGNOSIS — C50911 Malignant neoplasm of unspecified site of right female breast: Secondary | ICD-10-CM | POA: Diagnosis not present

## 2015-05-01 NOTE — Progress Notes (Signed)
   Department of Radiation Oncology  Phone:  [phone] Fax:        [phone]  Weekly Treatment Note    Name: Marissa Haney Date: 05/01/2015 MRN: [id number] DOB: [birth year]   Current dose: 54.4 Gy  Current fraction: 30   MEDICATIONS: Current Outpatient Prescriptions  Medication Sig Dispense Refill  . glipiZIDE (GLUCOTROL) 5 MG tablet Take 5 mg by mouth 2 (two) times daily before a meal.    . hyaluronate sodium (RADIAPLEXRX) GEL Apply 1 application topically 2 (two) times daily.    Marland Kitchen lisinopril (PRINIVIL,ZESTRIL) 10 MG tablet Take 1 tablet (10 mg total) by mouth daily. 30 tablet 3  . meclizine (ANTIVERT) 25 MG tablet Take 1 tablet (25 mg total) by mouth 2 (two) times daily. 60 tablet 3  . metoprolol (LOPRESSOR) 50 MG tablet Take 1 tablet (50 mg total) by mouth 2 (two) times daily. 60 tablet 1  . non-metallic deodorant (ALRA) MISC Apply 1 application topically daily as needed.    . ALPRAZolam (XANAX) 0.25 MG tablet Take 1 tablet (0.25 mg total) by mouth 2 (two) times daily as needed for anxiety. (Patient not taking: Reported on 04/17/2015) 30 tablet 0  . anastrozole (ARIMIDEX) 1 MG tablet Take 1 tablet (1 mg total) by mouth daily. Start Nov 15th (Patient not taking: Reported on 03/27/2015) 90 tablet 3   No current facility-administered medications for this encounter.     ALLERGIES: Review of patient's allergies indicates no known allergies.   LABORATORY DATA:  Lab Results  Component Value Date   WBC 13.4* 01/26/2015   HGB 13.2 01/26/2015   HCT 39.6 01/26/2015   MCV 94.5 01/26/2015   PLT 189 01/26/2015   Lab Results  Component Value Date   NA 139 01/20/2015   K 3.3* 01/20/2015   CL 105 01/20/2015   CO2 26 01/20/2015   Lab Results  Component Value Date   ALT 17 01/20/2015   AST 21 01/20/2015   ALKPHOS 122 01/20/2015   BILITOT 1.1 01/20/2015     NARRATIVE: [name] was seen today for weekly treatment management. The chart was checked and the  patient's films were reviewed.  Weekly rad txs right breast/subclavicular, hyperpigmentation, all areas, moist desquamation peeling under axilla, painful, using radiaplex bid, gave samples neosporin to use , has mesh tube  Already and using hydrogel pads,   Appetite poor, energy level down as well BP 147/65 mmHg  Pulse 66  Temp(Src) 97.9 F (36.6 C) (Oral)  Resp 16  Wt 239 lb 11.2 oz (108.727 kg)  Wt Readings from Last 3 Encounters:  05/01/15 239 lb 11.2 oz (108.727 kg)  04/17/15 241 lb 9.6 oz (109.589 kg)  04/08/15 246 lb 12.8 oz (111.948 kg)    PHYSICAL EXAMINATION: weight is 239 lb 11.2 oz (108.727 kg). Her oral temperature is 97.9 F (36.6 C). Her blood pressure is 147/65 and her pulse is 66. Her respiration is 16.      the patient's skin shows some hyperpigmentation. She has an area of desquamation in the axilla. The patient is applying Neosporin to this.  ASSESSMENT: The patient is doing satisfactorily with treatment.  PLAN: We will continue with the patient's radiation treatment as planned. The patient is proceeding with her boost treatment. The area of greatest irritation is outside of this and should be able to begin healing fairly soon.

## 2015-05-01 NOTE — Progress Notes (Addendum)
Weekly rad txs right breast/subclavicular, hyperpigmentation, all areas, moist desquamation peeling under axilla, painful, using radiaplex bid, gave samples neosporin to use , has mesh tube  Already and using hydrogel pads,   Appetite poor, energy level down as well BP 147/65 mmHg  Pulse 66  Temp(Src) 97.9 F (36.6 C) (Oral)  Resp 16  Wt 239 lb 11.2 oz (108.727 kg)  Wt Readings from Last 3 Encounters:  05/01/15 239 lb 11.2 oz (108.727 kg)  04/17/15 241 lb 9.6 oz (109.589 kg)  04/08/15 246 lb 12.8 oz (111.948 kg)

## 2015-05-01 NOTE — Progress Notes (Signed)
Complex simulation note  Diagnosis: Left-sided breast cancer  Narrative The patient has initially been planned to receive a course of whole breast radiation to a dose of 50.4 Gy in 28 fractions. The patient will now receive an additional boost to the seroma cavity which has been contoured. This will correspond to a boost of 10 Gy at 2 Gy per fraction. To accomplish this, an additional 3 customized blocks have been designed for this purpose. A complex isodose plan is requested to ensure that the target area is adequately covered with radiation dose and that the nearby normal structures such as the lung are adequately spared. The patient's final total dose will be 60.4 Gy.  ------------------------------------------------  Marissa Livingood S. Khyle Goodell, MD, PhD   

## 2015-05-04 ENCOUNTER — Ambulatory Visit
Admission: RE | Admit: 2015-05-04 | Discharge: 2015-05-04 | Disposition: A | Discharge status: Home / Self Care | Payer: Medicaid Other | Source: Ambulatory Visit | Attending: Radiation Oncology | Admitting: Radiation Oncology

## 2015-05-04 DIAGNOSIS — C50911 Malignant neoplasm of unspecified site of right female breast: Secondary | ICD-10-CM | POA: Diagnosis not present

## 2015-05-05 ENCOUNTER — Ambulatory Visit
Admission: RE | Admit: 2015-05-05 | Discharge: 2015-05-05 | Disposition: A | Discharge status: Home / Self Care | Payer: Medicaid Other | Source: Ambulatory Visit | Attending: Radiation Oncology | Admitting: Radiation Oncology

## 2015-05-05 DIAGNOSIS — C50911 Malignant neoplasm of unspecified site of right female breast: Secondary | ICD-10-CM | POA: Diagnosis not present

## 2015-05-06 ENCOUNTER — Ambulatory Visit
Admission: RE | Admit: 2015-05-06 | Discharge: 2015-05-06 | Disposition: A | Discharge status: Home / Self Care | Payer: Medicaid Other | Source: Ambulatory Visit | Attending: Radiation Oncology | Admitting: Radiation Oncology

## 2015-05-06 ENCOUNTER — Encounter: Payer: Self-pay | Admitting: Radiation Oncology

## 2015-05-06 VITALS — BP 215/64 | HR 78 | Temp 97.6°F | Resp 20 | Wt 239.6 lb

## 2015-05-06 DIAGNOSIS — C50411 Malignant neoplasm of upper-outer quadrant of right female breast: Secondary | ICD-10-CM

## 2015-05-06 DIAGNOSIS — C50911 Malignant neoplasm of unspecified site of right female breast: Secondary | ICD-10-CM | POA: Diagnosis not present
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
## 2015-05-06 NOTE — Progress Notes (Signed)
Weekly rad txs 33/33 right breast, dry desquamation,  Dark tanning, using radiaplex and neosporin, appetite good, fatigued, , 1 month f/u appt card given 10:39 AM BP 215/64 mmHg  Pulse 78  Temp(Src) 97.6 F (36.4 C) (Oral)  Resp 20  Wt 239 lb 9.6 oz (108.682 kg)  Wt Readings from Last 3 Encounters:  05/06/15 239 lb 9.6 oz (108.682 kg)  05/01/15 239 lb 11.2 oz (108.727 kg)  04/17/15 241 lb 9.6 oz (109.589 kg)

## 2015-05-07 ENCOUNTER — Ambulatory Visit
Admission: RE | Admit: 2015-05-07 | Discharge: 2015-05-07 | Disposition: A | Discharge status: Home / Self Care | Payer: Medicaid Other | Source: Ambulatory Visit | Attending: Hematology and Oncology | Admitting: Hematology and Oncology

## 2015-05-07 DIAGNOSIS — C50411 Malignant neoplasm of upper-outer quadrant of right female breast: Secondary | ICD-10-CM

## 2015-05-11 ENCOUNTER — Telehealth: Payer: Self-pay

## 2015-05-11 NOTE — Telephone Encounter (Signed)
Pt called asking when to start her anastrazole. She finished XTR last Wed 11/2. Per Dr Lindi Adie note she will start anastrazole 11/16. She was asking about something for weak bones d/t the anastrazole. Explained about Calcium & Vit D OTC.

## 2015-05-12 ENCOUNTER — Other Ambulatory Visit: Payer: Self-pay | Admitting: Adult Health

## 2015-05-12 DIAGNOSIS — C50411 Malignant neoplasm of upper-outer quadrant of right female breast: Secondary | ICD-10-CM

## 2015-05-18 ENCOUNTER — Telehealth: Payer: Self-pay | Admitting: Nurse Practitioner

## 2015-05-18 NOTE — Telephone Encounter (Signed)
Left message for patient re 12/16 SCP visit. Other appointments remain the same. Schedule mailed.

## 2015-05-19 NOTE — Progress Notes (Signed)
  Radiation Oncology         [phone] ________________________________  Name: Marissa Haney MRN: [id number]  Date: 05/06/2015  DOB: 11/04/1955  End of Treatment Note  Diagnosis:   Right-sided breast cancer     Indication for treatment:  Curative       Radiation treatment dates:   03/23/2015 through 05/06/2015  Site/dose:   The patient initially received a dose of 50.4 Gy in 28 fractions to the breast using whole-breast tangent fields as well as 2 additional fields to treat the supraclavicular region. Additionally, to reduce fields were used to improve the dose homogeneity of the plan.. This was delivered using a 3-D conformal technique. The patient then received a boost to the seroma. This delivered an additional 10 Gy in 5 fractions using a three-field technique. The total dose was 60.4 Gy.  Narrative: The patient tolerated radiation treatment relatively well.   The patient had some expected skin irritation as she progressed during treatment. Moist desquamation was not present at the end of treatment.  Plan: The patient has completed radiation treatment. The patient will return to radiation oncology clinic for routine followup in one month. I advised the patient to call or return sooner if they have any questions or concerns related to their recovery or treatment. ________________________________  Jodelle Gross, M.D., Ph.D.

## 2015-06-09 ENCOUNTER — Ambulatory Visit (HOSPITAL_BASED_OUTPATIENT_CLINIC_OR_DEPARTMENT_OTHER): Payer: Medicaid Other | Admitting: Hematology and Oncology

## 2015-06-09 ENCOUNTER — Other Ambulatory Visit: Payer: Self-pay | Admitting: Hematology and Oncology

## 2015-06-09 ENCOUNTER — Encounter: Payer: Self-pay | Admitting: *Deleted

## 2015-06-09 ENCOUNTER — Telehealth: Payer: Self-pay | Admitting: Hematology and Oncology

## 2015-06-09 ENCOUNTER — Encounter: Payer: Self-pay | Admitting: Hematology and Oncology

## 2015-06-09 VITALS — BP 199/54 | HR 61 | Temp 98.2°F | Resp 20 | Ht 67.0 in | Wt 247.9 lb

## 2015-06-09 DIAGNOSIS — Z170 Estrogen receptor positive status [ER+]: Secondary | ICD-10-CM | POA: Diagnosis not present

## 2015-06-09 DIAGNOSIS — C773 Secondary and unspecified malignant neoplasm of axilla and upper limb lymph nodes: Secondary | ICD-10-CM | POA: Diagnosis not present

## 2015-06-09 DIAGNOSIS — T451X5A Adverse effect of antineoplastic and immunosuppressive drugs, initial encounter: Secondary | ICD-10-CM

## 2015-06-09 DIAGNOSIS — I10 Essential (primary) hypertension: Secondary | ICD-10-CM

## 2015-06-09 DIAGNOSIS — G620 Drug-induced polyneuropathy: Secondary | ICD-10-CM

## 2015-06-09 DIAGNOSIS — C50411 Malignant neoplasm of upper-outer quadrant of right female breast: Secondary | ICD-10-CM

## 2015-06-09 NOTE — Addendum Note (Signed)
Addended by: Jonelle Sports K on: 06/09/2015 12:10 PM   Modules accepted: Orders, Medications

## 2015-06-09 NOTE — Assessment & Plan Note (Signed)
Right breast invasive ductal carcinoma 3.6 cm by MRI with enlarged right axillary lymph node biopsy proven to be positive for cancer , T2 N1 M0 stage 2B clinical stage.ER positive PR positive HER-2 negative Ki-67 22% Right lumpectomy 01/26/2015: IDC, positive for LVI, 5/12 lymph nodes positive with extracapsular extension, grade 1, multifocal 4 cm, 0.5 cm size E 100%, PR 97%, HER-2 negative, Ki-67 22%, T2 N2 stage IIIa Adjuvant radiation therapy 03/23/2015 to 05/06/2015  Current treatment: Anastrozole 1 mg daily started 05/19/2015 Anastrozole toxicities:  I recommended that she participate in [facility] clinical trial PALLAS clinical trial counseling: Patients who have completed definitive therapy for breast cancer are randomized to antiestrogen therapy (5+ years) versus antiestrogen therapy plus Palbociclib (2 years).  Palbociclib: If she was randomized to Palbociclib, I discussed the risks and benefits of Ibrance including myelosuppression especially neutropenia and with that risk of infection, there is risk of pulmonary embolism and mild peripheral neuropathy as well. Fatigue, nausea, diarrhea, decreased appetite as well as alopecia and thrombocytopenia are also potential side effects of Palbociclib.  Patient will meet with clinical trials coordinators to discuss this further

## 2015-06-09 NOTE — Telephone Encounter (Signed)
Appointments made and avs printed for patient °

## 2015-06-09 NOTE — Progress Notes (Addendum)
Patient Care Team: Cyndi Bender, PA-C as PCP - General (Physician Assistant)  DIAGNOSIS: No matching staging information was found for the patient.  SUMMARY OF ONCOLOGIC HISTORY:   Breast cancer of upper-outer quadrant of right female breast ([place])   05/27/2014 Mammogram Right breast mass by mammogram 5.4 cm by ultrasound 4 x 4 X 2 cm   05/28/2014 Initial Biopsy Right breast biopsy 11:30: Invasive ductal carcinoma with lymphovascular invasion grade 1/2, ER 100%, PR 97%, Ki-67 22%, HER-2 negative ratio 1.26 average copy #1.7, lymph node biopsy also positive   06/16/2014 Breast MRI right breast cancer: 3.6 cm at 12:00 position mildly enlarged right axillary lymph node: 5 mm oval enhancing mass in the central left breast could be an intramammary lymph node   07/10/2014 - 11/11/2014 Neo-Adjuvant Chemotherapy Dose dense Adriamycin and Cytoxan 4 followed by Abraxane 12   07/10/2014 Procedure PREVENT clinical trial (Lipitor vs Placebo) in patients undergoing cardiotoxic chemotherapy   12/08/2014 Breast MRI Mass appears smaller, now measuring 2.3 x 4.9 x 2.7 cm. Previously using the same dimensions mass measured 3.4 x 4.6 x 2.7 cm. Improvement of axill LN   01/26/2015 Surgery Right lumpectomy: IDC, positive for LVI, 5/12 lymph nodes positive with extracapsular extension, grade 1, multifocal 4 cm, 0.5 cm size E 100%, PR 97%, HER-2 negative, Ki-67 22%, T2 N2 stage IIIa   03/23/2015 - 05/06/2015 Radiation Therapy adjuvant breast radiation:50.4 Gy in 28 fractions to the breast using whole-breast tangent fields as well as 2 additional fields to treat the supraclavicular region   05/19/2015 -  Anti-estrogen oral therapy anastrozole 1 mg daily    CHIEF COMPLIANT: tolerating anastrozole extremely well  INTERVAL HISTORY: Marissa Haney is a 5 with above-mentioned history of right breast cancer was treated with neoadjuvant chemotherapy followed by surgery. She still had extensive amount of residual disease. She  finished radiation therapy and is currently on anastrozole and tolerating it extremely well. She continues to have neuropathy in her hands and feet as well as difficulty with lifting heavy objects especially reaching above her shoulder. She is dropping objects that are heavy. We were not able to get physical therapy because her insurance was not paying for it.  REVIEW OF SYSTEMS:   Constitutional: Denies fevers, chills or abnormal weight loss Eyes: Denies blurriness of vision Ears, nose, mouth, throat, and face: Denies mucositis or sore throat Respiratory: Denies cough, dyspnea or wheezes Cardiovascular: Denies palpitation, chest discomfort or lower extremity swelling Gastrointestinal:  Denies nausea, heartburn or change in bowel habits Skin: Denies abnormal skin rashes Lymphatics: Denies new lymphadenopathy or easy bruising Neurological:grade 1 peripheral neuropathy but decrease in the muscle strength to lift heavy objects especially in the right arm Behavioral/Psych: Mood is stable, no new changes  Breast:  denies any pain or lumps or nodules in either breasts All other systems were reviewed with the patient and are negative.  I have reviewed the past medical history, past surgical history, social history and family history with the patient and they are unchanged from previous note.  ALLERGIES:  has No Known Allergies.  MEDICATIONS:  Current Outpatient Prescriptions  Medication Sig Dispense Refill  . ALPRAZolam (XANAX) 0.25 MG tablet Take 1 tablet (0.25 mg total) by mouth 2 (two) times daily as needed for anxiety. (Patient not taking: Reported on 04/17/2015) 30 tablet 0  . anastrozole (ARIMIDEX) 1 MG tablet Take 1 tablet (1 mg total) by mouth daily. Start Nov 15th (Patient not taking: Reported on 03/27/2015) 90 tablet 3  .  glipiZIDE (GLUCOTROL) 5 MG tablet Take 5 mg by mouth 2 (two) times daily before a meal.    . hyaluronate sodium (RADIAPLEXRX) GEL Apply 1 application topically 2 (two)  times daily.    Marland Kitchen lisinopril (PRINIVIL,ZESTRIL) 10 MG tablet Take 1 tablet (10 mg total) by mouth daily. 30 tablet 3  . meclizine (ANTIVERT) 25 MG tablet Take 1 tablet (25 mg total) by mouth 2 (two) times daily. 60 tablet 3  . metoprolol (LOPRESSOR) 50 MG tablet Take 1 tablet (50 mg total) by mouth 2 (two) times daily. 60 tablet 1  . non-metallic deodorant (ALRA) MISC Apply 1 application topically daily as needed.     No current facility-administered medications for this visit.    PHYSICAL EXAMINATION: ECOG PERFORMANCE STATUS: 1 - Symptomatic but completely ambulatory  Filed Vitals:   06/09/15 0954  BP: 199/54  Pulse: 61  Temp: 98.2 F (36.8 C)  Resp: 20   Filed Weights   06/09/15 0954  Weight: 247 lb 14.4 oz (112.447 kg)    GENERAL:alert, no distress and comfortable SKIN: skin color, texture, turgor are normal, no rashes or significant lesions EYES: normal, Conjunctiva are pink and non-injected, sclera clear OROPHARYNX:no exudate, no erythema and lips, buccal mucosa, and tongue normal  NECK: supple, thyroid normal size, non-tender, without nodularity LYMPH:  no palpable lymphadenopathy in the cervical, axillary or inguinal LUNGS: clear to auscultation and percussion with normal breathing effort HEART: regular rate & rhythm and no murmurs and no lower extremity edema ABDOMEN:abdomen soft, non-tender and normal bowel sounds Musculoskeletal:no cyanosis of digits and no clubbing  NEURO: alert & oriented x 3 with fluent speech, grade 1 peripheral neuropathy   LABORATORY DATA:  I have reviewed the data as listed   Chemistry      Component Value Date/Time   NA 139 01/20/2015 1045   NA 142 11/18/2014 1406   K 3.3* 01/20/2015 1045   K 3.5 11/18/2014 1406   CL 105 01/20/2015 1045   CO2 26 01/20/2015 1045   CO2 26 11/18/2014 1406   BUN 9 01/20/2015 1045   BUN 9.0 11/18/2014 1406   CREATININE 0.82 01/26/2015 1906   CREATININE 0.9 11/18/2014 1406      Component Value  Date/Time   CALCIUM 8.7* 01/20/2015 1045   CALCIUM 9.1 11/18/2014 1406   ALKPHOS 122 01/20/2015 1045   ALKPHOS 94 11/18/2014 1406   AST 21 01/20/2015 1045   AST 16 11/18/2014 1406   ALT 17 01/20/2015 1045   ALT 15 11/18/2014 1406   BILITOT 1.1 01/20/2015 1045   BILITOT 0.57 11/18/2014 1406       Lab Results  Component Value Date   WBC 13.4* 01/26/2015   HGB 13.2 01/26/2015   HCT 39.6 01/26/2015   MCV 94.5 01/26/2015   PLT 189 01/26/2015   NEUTROABS 4.9 01/20/2015   ASSESSMENT & PLAN:  Breast cancer of upper-outer quadrant of right female breast ([facility]) Right breast invasive ductal carcinoma 3.6 cm by MRI with enlarged right axillary lymph node biopsy proven to be positive for cancer , T2 N1 M0 stage 2B clinical stage.ER positive PR positive HER-2 negative Ki-67 22% Right lumpectomy 01/26/2015: IDC, positive for LVI, 5/12 lymph nodes positive with extracapsular extension, grade 1, multifocal 4 cm, 0.5 cm size E 100%, PR 97%, HER-2 negative, Ki-67 22%, T2 N2 stage IIIa Adjuvant radiation therapy 03/23/2015 to 05/06/2015  Current treatment: Anastrozole 1 mg daily started 05/19/2015 Anastrozole toxicities: tolerating it extremely well without any side effects.  Hypertension:  Patient's primary care physician changed her medications which she is yet to pick up at the pharmacy.  I recommended that she participate in PALLAS clinical trial PALLAS clinical trial counseling: Patients who have completed definitive therapy for breast cancer are randomized to antiestrogen therapy (5+ years) versus antiestrogen therapy plus Palbociclib (2 years).  Palbociclib: If she was randomized to Palbociclib, I discussed the risks and benefits of Ibrance including myelosuppression especially neutropenia and with that risk of infection, there is risk of pulmonary embolism and mild peripheral neuropathy as well. Fatigue, nausea, diarrhea, decreased appetite as well as alopecia and thrombocytopenia are also  potential side effects of Palbociclib.  Patient will meet with clinical trials coordinators to discuss this further  Return to clinic based upon clinical trials decision.  No orders of the defined types were placed in this encounter.   The patient has a good understanding of the overall plan. she agrees with it. she will call with any problems that may develop before the next visit here.   Rulon Eisenmenger, MD 06/09/2015   Addendum: Patient is not eligible for the clinical trial because it has been over one year from her diagnosis. She will remain on anastrozole.

## 2015-06-19 ENCOUNTER — Encounter: Payer: Self-pay | Admitting: Radiation Oncology

## 2015-06-19 ENCOUNTER — Telehealth: Payer: Self-pay | Admitting: Hematology and Oncology

## 2015-06-19 ENCOUNTER — Encounter: Payer: Medicaid Other | Admitting: Nurse Practitioner

## 2015-06-19 ENCOUNTER — Ambulatory Visit
Admission: RE | Admit: 2015-06-19 | Discharge: 2015-06-19 | Disposition: A | Discharge status: Home / Self Care | Payer: Medicaid Other | Source: Ambulatory Visit | Attending: Radiation Oncology | Admitting: Radiation Oncology

## 2015-06-19 ENCOUNTER — Encounter: Payer: Self-pay | Admitting: Nurse Practitioner

## 2015-06-19 VITALS — BP 146/58 | HR 118 | Temp 97.7°F | Resp 20 | Ht 67.0 in | Wt 240.2 lb

## 2015-06-19 DIAGNOSIS — C50411 Malignant neoplasm of upper-outer quadrant of right female breast: Secondary | ICD-10-CM

## 2015-06-19 NOTE — Progress Notes (Signed)
Radiation Oncology         [phone] ________________________________  Name: Marissa Haney MRN: [id number]  Date: 06/19/2015  DOB: [birth year]  Follow-Up Visit Note  CC: [name], MD  Diagnosis: Right-sided Breast Cancer  No diagnosis found.  Interval Since Last Radiation:  6 weeks. 03/23/2015 through 05/06/2015  Site/dose:   The patient initially received a dose of 50.4 Gy in 28 fractions to the breast using whole-breast tangent fields as well as 2 additional fields to treat the supraclavicular region. Additionally, to reduce fields were used to improve the dose homogeneity of the plan.. This was delivered using a 3-D conformal technique. The patient then received a boost to the seroma. This delivered an additional 10 Gy in 5 fractions using a three-field technique. The total dose was 60.4 Gy.  Narrative:  The patient returns today for routine follow-up. She has been feeling dizzy lately and needs to get an endocrinologist for her diabetes. She has a good appetite. Fatigued. She has met Chestine Spore, NP for survivorship today. She saw Dr. Lindi Adie on 06/09/15 and will follow up with him on 12/09/15. She still has problems holding and dropping things with her right arm. She is taking Arimadex.  ALLERGIES:  has No Known Allergies.  Meds: Current Outpatient Prescriptions  Medication Sig Dispense Refill  . ALPRAZolam (XANAX) 0.25 MG tablet Take 1 tablet (0.25 mg total) by mouth 2 (two) times daily as needed for anxiety. 30 tablet 0  . anastrozole (ARIMIDEX) 1 MG tablet Take 1 tablet (1 mg total) by mouth daily. Start Nov 15th 90 tablet 3  . glipiZIDE (GLUCOTROL) 5 MG tablet Take 5 mg by mouth 2 (two) times daily before a meal.    . lisinopril (PRINIVIL,ZESTRIL) 10 MG tablet Take 1 tablet (10 mg total) by mouth daily. 30 tablet 3  . meclizine (ANTIVERT) 25 MG tablet Take 1 tablet (25 mg total) by mouth 2 (two) times daily. 60 tablet 3  . metoprolol (LOPRESSOR)  50 MG tablet Take 1 tablet (50 mg total) by mouth 2 (two) times daily. 60 tablet 1   No current facility-administered medications for this encounter.    Physical Findings: The patient is in no acute distress. Patient is alert and oriented.  height is '5\' 7"'  (1.702 m) and weight is 240 lb 3.2 oz (108.954 kg). Her oral temperature is 97.7 F (36.5 C). Her blood pressure is 146/58 and her pulse is 118. Her respiration is 20.   The patient has some residual hyperpigmentation in the treatment area. Skin has healed well with some remaining dryness of skin, including in the supraclavicular region.  Lab Findings: Lab Results  Component Value Date   WBC 13.4* 01/26/2015   WBC 6.0 11/18/2014   HGB 13.2 01/26/2015   HGB 12.3 11/18/2014   HCT 39.6 01/26/2015   HCT 36.7 11/18/2014   PLT 189 01/26/2015   PLT 265 11/18/2014    Lab Results  Component Value Date   NA 139 01/20/2015   NA 142 11/18/2014   K 3.3* 01/20/2015   K 3.5 11/18/2014   CHLORIDE 105 11/18/2014   CO2 26 01/20/2015   CO2 26 11/18/2014   GLUCOSE 255* 01/20/2015   GLUCOSE 229* 11/18/2014   BUN 9 01/20/2015   BUN 9.0 11/18/2014   CREATININE 0.82 01/26/2015   CREATININE 0.9 11/18/2014   BILITOT 1.1 01/20/2015   BILITOT 0.57 11/18/2014   ALKPHOS 122 01/20/2015   ALKPHOS 94 11/18/2014   AST 21  01/20/2015   AST 16 11/18/2014   ALT 17 01/20/2015   ALT 15 11/18/2014   PROT 7.5 01/20/2015   PROT 7.0 11/18/2014   ALBUMIN 3.5 01/20/2015   ALBUMIN 3.5 11/18/2014   CALCIUM 8.7* 01/20/2015   CALCIUM 9.1 11/18/2014   ANIONGAP 8 01/20/2015   ANIONGAP 11 11/18/2014    Radiographic Findings: No results found.  Impression:  The patient is recovering from the effects of radiation.  Plan: We discussed that she could use vitamin E lotion in the treatment area. She is doing well.  She can always call me with questions.  I will follow up with her on an as needed basis.  _____________________________________ Jodelle Gross,  M.D., Ph.D.  This document serves as a record of services personally performed by Kyung Rudd, MD. It was created on his behalf by Darcus Austin, a trained medical scribe. The creation of this record is based on the scribe's personal observations and the provider's statements to them. This document has been checked and approved by the attending provider.

## 2015-06-19 NOTE — Telephone Encounter (Signed)
Called patient and she is aware of her new follow up appointment per pof  anne

## 2015-06-19 NOTE — Progress Notes (Signed)
Met with patient in radiation oncology due to conflict with Survivorship appointment. Provided Marissa Haney a copy of her survivorship care plan reviewing her treatment summary, long and late term side effects, and recommendations for surveillance.  Marissa Haney continues with pain and neuropathy following treatment and I have reinforced that this can take months to resolve.  I attempted to answer her questions regarding post-treatment changes and offer support.  I encouraged her to review her survivorship care plan and reach out to me with questions.  I spoke with Jonelle Sports RN with Dr. Geralyn Flash office and we will facilitate an earlier follow up appointment for Marissa Haney with our schedulers.   I will not be placing any follow-up appointments to the Survivorship Clinic for Marissa Haney, but I am happy to see her at any time in the future for any survivorship concerns that may arise. Thank you for allowing me to participate in her care!  Kenn File, [place] [phone]

## 2015-06-19 NOTE — Progress Notes (Addendum)
Follow up  Right breast rad txs,   03/23/15, 05/06/15  Well healed, dryness and tanning still,  Has been feling dizzy recently needs to get an endocrinologist for her Diabetes,  appetite good, fatigue has survivorship appt 06/19/15 with Chestine Spore,  Saw Dr. Lindi Adie 06/09/15 f/u 12/09/15  Still has problems holding things and dropping things right arm 2:39 PM BP 146/58 mmHg  Pulse 118  Temp(Src) 97.7 F (36.5 C) (Oral)  Resp 20  Ht 5\' 7"  (1.702 m)  Wt 240 lb 3.2 oz (108.954 kg)  BMI 37.61 kg/m2  Wt Readings from Last 3 Encounters:  06/19/15 240 lb 3.2 oz (108.954 kg)  06/09/15 247 lb 14.4 oz (112.447 kg)  05/06/15 239 lb 9.6 oz (108.682 kg)

## 2015-06-23 ENCOUNTER — Telehealth: Payer: Self-pay | Admitting: Hematology and Oncology

## 2015-06-23 NOTE — Telephone Encounter (Signed)
Patient had called in and left a message to cancel 12/23 as she will be seeing dr Dalbert Batman

## 2015-06-26 ENCOUNTER — Ambulatory Visit: Payer: Medicaid Other | Admitting: Hematology and Oncology

## 2015-08-05 ENCOUNTER — Encounter: Payer: Self-pay | Admitting: Adult Health

## 2015-08-05 NOTE — Progress Notes (Signed)
A birthday card was mailed to the patient today on behalf of the Survivorship Program at [facility].   Affan Callow, NP Survivorship Program [facility] [phone]  

## 2015-08-13 ENCOUNTER — Telehealth: Payer: Self-pay | Admitting: Nurse Practitioner

## 2015-08-13 NOTE — Telephone Encounter (Signed)
Patient called wanting name of endocrinologist to see about better managing her DM.  Gave her name for [facility] along with contact number and she will call to set up appointment with them.  Next appointment with Dr. Lindi Adie in June 2017.  Advised that if new symptoms developed or changes noted that necessitated her to be seen sooner, to please call office.  Pt voiced understanding - no other questions at this time.

## 2015-11-11 ENCOUNTER — Other Ambulatory Visit: Payer: Self-pay | Admitting: General Surgery

## 2015-11-11 DIAGNOSIS — Z853 Personal history of malignant neoplasm of breast: Secondary | ICD-10-CM

## 2015-11-24 ENCOUNTER — Ambulatory Visit
Admission: RE | Admit: 2015-11-24 | Discharge: 2015-11-24 | Disposition: A | Discharge status: Home / Self Care | Payer: Medicaid Other | Source: Ambulatory Visit | Attending: General Surgery | Admitting: General Surgery

## 2015-11-24 DIAGNOSIS — Z853 Personal history of malignant neoplasm of breast: Secondary | ICD-10-CM

## 2015-12-09 ENCOUNTER — Ambulatory Visit (HOSPITAL_BASED_OUTPATIENT_CLINIC_OR_DEPARTMENT_OTHER): Payer: Medicaid Other | Admitting: Hematology and Oncology

## 2015-12-09 ENCOUNTER — Telehealth: Payer: Self-pay | Admitting: Hematology and Oncology

## 2015-12-09 ENCOUNTER — Encounter: Payer: Self-pay | Admitting: Hematology and Oncology

## 2015-12-09 VITALS — BP 142/60 | HR 76 | Temp 98.0°F | Resp 19 | Ht 67.0 in | Wt 244.5 lb

## 2015-12-09 DIAGNOSIS — Z923 Personal history of irradiation: Secondary | ICD-10-CM

## 2015-12-09 DIAGNOSIS — Z170 Estrogen receptor positive status [ER+]: Secondary | ICD-10-CM | POA: Diagnosis not present

## 2015-12-09 DIAGNOSIS — Z79811 Long term (current) use of aromatase inhibitors: Secondary | ICD-10-CM | POA: Diagnosis not present

## 2015-12-09 DIAGNOSIS — Z9221 Personal history of antineoplastic chemotherapy: Secondary | ICD-10-CM

## 2015-12-09 DIAGNOSIS — I10 Essential (primary) hypertension: Secondary | ICD-10-CM | POA: Diagnosis not present

## 2015-12-09 DIAGNOSIS — C50411 Malignant neoplasm of upper-outer quadrant of right female breast: Secondary | ICD-10-CM

## 2015-12-09 DIAGNOSIS — M25611 Stiffness of right shoulder, not elsewhere classified: Secondary | ICD-10-CM

## 2015-12-09 NOTE — Telephone Encounter (Signed)
appt made and avs printed °

## 2015-12-09 NOTE — Progress Notes (Signed)
Patient Care Team: Cyndi Bender, PA-C as PCP - General (Physician Assistant) Nicholas Lose, MD as Consulting Physician (Hematology and Oncology) Fanny Skates, MD as Consulting Physician (General Surgery) Kyung Rudd, MD as Consulting Physician (Radiation Oncology) Sylvan Cheese, NP as Nurse Practitioner (Hematology and Oncology)  DIAGNOSIS: Breast cancer of upper-outer quadrant of right female breast [facility])   Staging form: Breast, AJCC 7th Edition     Clinical stage from 06/16/2014: Stage IIB (T2, N1, M0) - Unsigned     Pathologic stage from 01/26/2015: Stage IIIA (T2, N2, cM0) - Unsigned   SUMMARY OF ONCOLOGIC HISTORY:   Breast cancer of upper-outer quadrant of right female breast ([place])   05/27/2014 Mammogram Right breast mass by mammogram 5.4 cm by ultrasound 4 x 4 X 2 cm   05/28/2014 Initial Biopsy Right breast biopsy 11:30: Invasive ductal carcinoma with lymphovascular invasion grade 1/2, ER 100%, PR 97%, Ki-67 22%, HER-2 negative ratio 1.26 average copy #1.7, lymph node biopsy also positive   06/16/2014 Breast MRI right breast cancer: 3.6 cm at 12:00 position mildly enlarged right axillary lymph node: 5 mm oval enhancing mass in the central left breast could be an intramammary lymph node   06/16/2014 Clinical Stage Stage IIB (T2 N1)     07/10/2014 - 11/11/2014 Neo-Adjuvant Chemotherapy Dose dense Adriamycin and Cytoxan 4 followed by Abraxane 12   07/10/2014 Procedure PREVENT clinical trial (Lipitor vs Placebo) in patients undergoing cardiotoxic chemotherapy   12/08/2014 Breast MRI Mass appears smaller, now measuring 2.3 x 4.9 x 2.7 cm. Previously using the same dimensions mass measured 3.4 x 4.6 x 2.7 cm. Improvement of axill LN   01/26/2015 Definitive Surgery Right lumpectomy: IDC, positive for LVI, 5/12 lymph nodes positive with extracapsular extension, grade 1, multifocal 4 cm, 0.5 cm size E 100%, PR 97%, HER-2 negative, Ki-67 22%   01/26/2015 Pathologic Stage Stage IIIA: T2 N2     03/23/2015 - 05/06/2015 Radiation Therapy Adjuvant breast radiation [facility]): Right breast 50.4 Gy over 28 fractions; right supraclavicular 50.4 Gy; right breast boost: 10 Gy over 5 fractions.   05/19/2015 -  Anti-estrogen oral therapy Anastrozole 1 mg daily   06/19/2015 Survivorship Survivorship care plan given to patient    CHIEF COMPLIANT: Follow-up on anastrozole  INTERVAL HISTORY: Marissa Haney is a 60 year old with above-mentioned history of right breast cancer currently on anastrozole adjuvant therapy. She appears to be tolerating it moderately well. She does have intermittent hot flashes and intermittent myalgias. Her major complaint today is inability to lift the arm above shoulder level. She continues to have difficulty with lifting heavy objects. She sometimes requires help with activities of daily living.  REVIEW OF SYSTEMS:   Constitutional: Denies fevers, chills or abnormal weight loss Eyes: Denies blurriness of vision Ears, nose, mouth, throat, and face: Denies mucositis or sore throat Respiratory: Denies cough, dyspnea or wheezes Cardiovascular: Denies palpitation, chest discomfort Gastrointestinal:  Denies nausea, heartburn or change in bowel habits Skin: Denies abnormal skin rashes Lymphatics: Denies new lymphadenopathy or easy bruising Neurological:Denies numbness, tingling or new weaknesses Behavioral/Psych: Mood is stable, no new changes  Extremities: Difficulty with elevation of the right arm over the shoulder. Breast:  denies any pain or lumps or nodules in either breasts All other systems were reviewed with the patient and are negative.  I have reviewed the past medical history, past surgical history, social history and family history with the patient and they are unchanged from previous note.  ALLERGIES:  has No Known Allergies.  MEDICATIONS:  Current Outpatient Prescriptions  Medication Sig Dispense Refill  . ALPRAZolam (XANAX) 0.25 MG tablet Take 1 tablet  (0.25 mg total) by mouth 2 (two) times daily as needed for anxiety. 30 tablet 0  . anastrozole (ARIMIDEX) 1 MG tablet Take 1 tablet (1 mg total) by mouth daily. Start Nov 15th 90 tablet 3  . glipiZIDE (GLUCOTROL) 5 MG tablet Take 5 mg by mouth 2 (two) times daily before a meal.    . lisinopril (PRINIVIL,ZESTRIL) 10 MG tablet Take 1 tablet (10 mg total) by mouth daily. 30 tablet 3  . meclizine (ANTIVERT) 25 MG tablet Take 1 tablet (25 mg total) by mouth 2 (two) times daily. 60 tablet 3  . metoprolol (LOPRESSOR) 50 MG tablet Take 1 tablet (50 mg total) by mouth 2 (two) times daily. 60 tablet 1   No current facility-administered medications for this visit.    PHYSICAL EXAMINATION: ECOG PERFORMANCE STATUS: 1 - Symptomatic but completely ambulatory  Filed Vitals:   12/09/15 1026  BP: 142/60  Pulse: 76  Temp: 98 F (36.7 C)  Resp: 19   Filed Weights   12/09/15 1026  Weight: 244 lb 8 oz (110.904 kg)    GENERAL:alert, no distress and comfortable SKIN: skin color, texture, turgor are normal, no rashes or significant lesions EYES: normal, Conjunctiva are pink and non-injected, sclera clear OROPHARYNX:no exudate, no erythema and lips, buccal mucosa, and tongue normal  NECK: supple, thyroid normal size, non-tender, without nodularity LYMPH:  no palpable lymphadenopathy in the cervical, axillary or inguinal LUNGS: clear to auscultation and percussion with normal breathing effort HEART: regular rate & rhythm and no murmurs and no lower extremity edema ABDOMEN:abdomen soft, non-tender and normal bowel sounds MUSCULOSKELETAL:no cyanosis of digits and no clubbing  NEURO: alert & oriented x 3 with fluent speech, no focal motor/sensory deficits EXTREMITIES: No lower extremity edema BREAST: No palpable masses or nodules in either right or left breasts. No palpable axillary supraclavicular or infraclavicular adenopathy no breast tenderness or nipple discharge. (exam performed in the presence of a  chaperone)  LABORATORY DATA:  I have reviewed the data as listed   Chemistry      Component Value Date/Time   NA 139 01/20/2015 1045   NA 142 11/18/2014 1406   K 3.3* 01/20/2015 1045   K 3.5 11/18/2014 1406   CL 105 01/20/2015 1045   CO2 26 01/20/2015 1045   CO2 26 11/18/2014 1406   BUN 9 01/20/2015 1045   BUN 9.0 11/18/2014 1406   CREATININE 0.82 01/26/2015 1906   CREATININE 0.9 11/18/2014 1406      Component Value Date/Time   CALCIUM 8.7* 01/20/2015 1045   CALCIUM 9.1 11/18/2014 1406   ALKPHOS 122 01/20/2015 1045   ALKPHOS 94 11/18/2014 1406   AST 21 01/20/2015 1045   AST 16 11/18/2014 1406   ALT 17 01/20/2015 1045   ALT 15 11/18/2014 1406   BILITOT 1.1 01/20/2015 1045   BILITOT 0.57 11/18/2014 1406       Lab Results  Component Value Date   WBC 13.4* 01/26/2015   HGB 13.2 01/26/2015   HCT 39.6 01/26/2015   MCV 94.5 01/26/2015   PLT 189 01/26/2015   NEUTROABS 4.9 01/20/2015   ASSESSMENT & PLAN:  Breast cancer of upper-outer quadrant of right female breast ([place]) Right breast invasive ductal carcinoma 3.6 cm by MRI with enlarged right axillary lymph node biopsy proven to be positive for cancer , T2 N1 M0 stage 2B clinical stage.ER positive PR  positive HER-2 negative Ki-67 22% Right lumpectomy 01/26/2015: IDC, positive for LVI, 5/12 lymph nodes positive with extracapsular extension, grade 1, multifocal 4 cm, 0.5 cm size E 100%, PR 97%, HER-2 negative, Ki-67 22%, T2 N2 stage IIIa Adjuvant radiation therapy 03/23/2015 to 05/06/2015  Current treatment: Anastrozole 1 mg daily started 05/19/2015 Anastrozole toxicities: tolerating it extremely well without any side effects.  Hypertension: Patient's primary care physician changed her medications which she is yet to pick up at the pharmacy.  Breast Cancer Surveillance: 1. Breast exam 12/09/2015: Normal 2. Mammogram 11/24/2015 No abnormalities. Postsurgical changes. Breast Density Category B. I recommended that she get  3-D mammograms for surveillance. Discussed the differences between different breast density categories.  Difficulty with range of motion of right arm: I will consult physical therapy.  I also provided her with information regarding the [employer] live strong program. I discussed with her the importance of losing weight. Gave her the information of the weight loss program.  Return to clinic in 6 months for follow up.  No orders of the defined types were placed in this encounter.   The patient has a good understanding of the overall plan. she agrees with it. she will call with any problems that may develop before the next visit here.   Rulon Eisenmenger, MD 12/09/2015

## 2015-12-09 NOTE — Assessment & Plan Note (Signed)
Right breast invasive ductal carcinoma 3.6 cm by MRI with enlarged right axillary lymph node biopsy proven to be positive for cancer , T2 N1 M0 stage 2B clinical stage.ER positive PR positive HER-2 negative Ki-67 22% Right lumpectomy 01/26/2015: IDC, positive for LVI, 5/12 lymph nodes positive with extracapsular extension, grade 1, multifocal 4 cm, 0.5 cm size E 100%, PR 97%, HER-2 negative, Ki-67 22%, T2 N2 stage IIIa Adjuvant radiation therapy 03/23/2015 to 05/06/2015  Current treatment: Anastrozole 1 mg daily started 05/19/2015 Anastrozole toxicities: tolerating it extremely well without any side effects.  Hypertension: Patient's primary care physician changed her medications which she is yet to pick up at the pharmacy.  Breast Cancer Surveillance: 1. Breast exam 12/09/2015: Normal 2. Mammogram 11/24/2015 No abnormalities. Postsurgical changes. Breast Density Category B. I recommended that she get 3-D mammograms for surveillance. Discussed the differences between different breast density categories.    Return to clinic in 6 months for follow

## 2015-12-22 ENCOUNTER — Ambulatory Visit: Payer: Medicaid Other | Attending: Hematology and Oncology | Admitting: Physical Therapy

## 2015-12-22 DIAGNOSIS — I890 Lymphedema, not elsewhere classified: Secondary | ICD-10-CM | POA: Insufficient documentation

## 2015-12-22 DIAGNOSIS — L599 Disorder of the skin and subcutaneous tissue related to radiation, unspecified: Secondary | ICD-10-CM | POA: Diagnosis present

## 2015-12-22 DIAGNOSIS — M25611 Stiffness of right shoulder, not elsewhere classified: Secondary | ICD-10-CM | POA: Insufficient documentation

## 2015-12-22 NOTE — Therapy (Signed)
[place], [place], [address] Phone: [phone]   Fax:  [phone]  Physical Therapy Evaluation  Patient Details  Name: Marissa Haney MRN: [id number] Date of Birth: [birth year] Referring Provider: Dr. Lindi Adie  Encounter Date: 12/22/2015      PT End of Session - 12/22/15 1747    Visit Number 1   Number of Visits 4   Date for PT Re-Evaluation 02/21/16   PT Start Time [id number]   PT Stop Time 1100   PT Time Calculation (min) 45 min   Activity Tolerance Patient tolerated treatment well   Behavior During Therapy [facility] for tasks assessed/performed      Past Medical History  Diagnosis Date  . Hypertension   . Cancer ([place])   . Breast cancer ([place])     right breast  . Alopecia   . Neuromuscular disorder ([facility])     neuropathy s/p chemotherapy  . Anxiety   . Family history of adverse reaction to anesthesia     sister's tongue swelled up and could not breathe  . Asthma     only gets inhaler if sick (with flu or cold for example)  . Diabetes mellitus without complication ([place])     type 2  . Urinary urgency     Past Surgical History  Procedure Laterality Date  . Abdominal hysterectomy    . Axillary lymph node biopsy    . Colonoscopy    . Portacath placement Left 06/19/2014    Procedure: INSERTION PORT-A-CATH;  Surgeon: Fanny Skates, MD;  Location: [place];  Service: General;  Laterality: Left;  . Dilation and curettage of uterus    . Breast lumpectomy with needle localization and axillary lymph node dissection Right 01/26/2015    Procedure: RIGHT PARTIAL MASTECTOMY WITH DOUBLE WIRE BRACKETED  NEEDLE LOCALIZATION AND RIGHT AXILLARY LYMPH NODE DISSECTION;  Surgeon: Fanny Skates, MD;  Location: [place];  Service: General;  Laterality: Right;  . Port-a-cath removal Left 01/26/2015    Procedure: REMOVAL PORT-A-CATH;  Surgeon: Fanny Skates, MD;  Location: [place];  Service: General;   Laterality: Left;    There were no vitals filed for this visit.       Subjective Assessment - 12/22/15 1034    Subjective pt is here because she cannot use her right arm to help with taking care of herself  she cannot lift anything heavy with her arm    Pertinent History breast cancer with chemotherapy, lumpectomy with 12 lymph nodes removed  and radiation therapy.    Patient Stated Goals to get the right arm stronger    Currently in Pain? No/denies  has pain when she tries to lay on it or move a certain way             [facility] PT Assessment - 12/22/15 0001    Assessment   Medical Diagnosis right breast cancer    Referring Provider Dr. Lindi Adie   Onset Date/Surgical Date 05/27/14   Hand Dominance Left   Precautions   Precautions Other (comment)   Precaution Comments cancer history with chemo and radiation    Restrictions   Weight Bearing Restrictions No   Balance Screen   Has the patient fallen in the past 6 months No   Has the patient had a decrease in activity level because of a fear of falling?  No   Is the patient reluctant to leave their home because of a fear of falling?  No  Home Environment   Living Environment Private residence   Prior Function   Level of Independence Needs assistance with ADLs  difficult as pt can't use right hand very well    Leisure regularly walks about 30 minutes a day, wants to start livestrong proggram and a weight loss program    Comments pt is limited by fatigue    Cognition   Overall Cognitive Status Within Functional Limits for tasks assessed   Observation/Other Assessments   Observations pt has healed incisions in right axilla and right breast.  right breast is smaller than left and she wears a partial prosthesis  Visible fullness at right lateral  chest and assymmetrical excursion of scapula with incrased movment of right with bilateral UE elevation    Skin Integrity intact   Other Surveys  --  lymphedemd life impact scale 42 or 62%  impaired    Observation/Other Assessments-Edema    Edema --  visible edema in right UE and lateral trunk    Coordination   Gross Motor Movements are Fluid and Coordinated No  decreased right scapular stabalzation    Posture/Postural Control   Posture/Postural Control Postural limitations   Postural Limitations Rounded Shoulders;Forward head   Posture Comments pt obese   ROM / Strength   AROM / PROM / Strength AROM   AROM   Right/Left Shoulder Right   Right Shoulder Flexion 130 Degrees   Right Shoulder ABduction 75 Degrees   Right Shoulder External Rotation 30 Degrees   Strength   Overall Strength Deficits   Overall Strength Comments pt has difficulty with supine to sit due to generalized weakness   Left Shoulder Flexion 3-/5   Left Shoulder ABduction 3-/5   Left Shoulder External Rotation 3-/5           LYMPHEDEMA/ONCOLOGY QUESTIONNAIRE - 12/22/15 1040    Type   Cancer Type right breast    Surgeries   Lumpectomy Date 01/26/15   Treatment   Past Chemotherapy Treatment Yes   Date 11/11/14   Past Radiation Treatment Yes   Date 05/06/15   Body Site right upper quadrant    Right Upper Extremity Lymphedema   15 cm Proximal to Olecranon Process 43 cm   10 cm Proximal to Olecranon Process 39.8 cm   Olecranon Process 29 cm   15 cm Proximal to Ulnar Styloid Process 29.5 cm   10 cm Proximal to Ulnar Styloid Process 26 cm   Just Proximal to Ulnar Styloid Process 19 cm   Across Hand at [employer] 21 cm   At [place] of 2nd Digit 6.8 cm   Left Upper Extremity Lymphedema   15 cm Proximal to Olecranon Process 41 cm   10 cm Proximal to Olecranon Process 39 cm   Olecranon Process 29 cm   15 cm Proximal to Ulnar Styloid Process 28 cm   10 cm Proximal to Ulnar Styloid Process 24.9 cm   Just Proximal to Ulnar Styloid Process 17.5 cm   Across Hand at [employer] 21 cm   At [place] of 2nd Digit 6.5 cm           Quick Dash - 12/22/15 0001    Open a tight or new jar Severe  difficulty   Do heavy household chores (wash walls, wash floors) Severe difficulty   Carry a shopping bag or briefcase Unable   Wash your back Unable   Use a knife to cut food Severe difficulty   Recreational activities in which you  take some force or impact through your arm, shoulder, or hand (golf, hammering, tennis) Severe difficulty   During the past week, to what extent has your arm, shoulder or hand problem interfered with your normal social activities with family, friends, neighbors, or groups? Slightly   During the past week, to what extent has your arm, shoulder or hand problem limited your work or other regular daily activities Extremely   Arm, shoulder, or hand pain. Severe   Tingling (pins and needles) in your arm, shoulder, or hand Moderate   Difficulty Sleeping Moderate difficulty   DASH Score 72.73 %                             Long Term Clinic Goals - 12/22/15 1755    CC Long Term Goal  #1   Title Patient with verbalize an understanding of lymphedema risk reduction precautions   Baseline no knowledge   Period --  authorizaion period    Status New   CC Long Term Goal  #2   Title Patient will be independent in a  home exercise program   Baseline no knowledge   Period --  authorizaion period    Status New   CC Long Term Goal  #3   Title Patient will be know how to obtain and use compression garments /pump for lymphedema management    Baseline no knowledge   Period --  authorizaion period    CC Long Term Goal  #4   Title --   Baseline --   Period --            Plan - 12/22/15 1748    Clinical Impression Statement 60 yo female who has undergone chemo, radiation and lumpectomy with 12 nodes removed for right breast cancer.  She is experiencing decresed range of motion and strength in right UE with decresed scapular stabalization and has visible and measureable increased in right upper quadrant circumfernces.    Rehab Potential Excellent    Clinical Impairments Affecting Rehab Potential previous radiation , 12 nodes removed    PT Frequency Other (comment)  3 visits over 2 months    PT Treatment/Interventions ADLs/Self Care Home Management;Manual lymph drainage;Compression bandaging;Neuromuscular re-education;Passive range of motion;Patient/family education;Therapeutic activities;Therapeutic exercise;Manual techniques;Taping   PT Next Visit Plan Instruct in supine scapular series and scapular protraction strength.  Make sure pt comes to [facility] for lymphedema risk reduction education. perform manual lymph drianage if time , consider compression bandaging . if can coordinate with fitter to measure when bandages come off. consider compression pump send demographics to fitter for sleeve, glove, bra, and pump  company   Recommended Other Services [facility]    Consulted and Agree with Plan of Care Patient      Patient will benefit from skilled therapeutic intervention in order to improve the following deficits and impairments:  Decreased range of motion, Impaired UE functional use, Decreased endurance, Decreased knowledge of precautions, Impaired perceived functional ability, Decreased knowledge of use of DME, Decreased strength, Increased edema, Postural dysfunction  Visit Diagnosis: Lymphedema, not elsewhere classified - Plan: PT plan of care cert/re-cert  Stiffness of right shoulder, not elsewhere classified - Plan: PT plan of care cert/re-cert  Disorder of the skin and subcutaneous tissue related to radiation, unspecified - Plan: PT plan of care cert/re-cert     Problem List Patient Active Problem List   Diagnosis Date Noted  . Chemotherapy-induced neuropathy ([place]) 08/26/2014  .  Breast cancer of upper-outer quadrant of right female breast ([place]) 06/11/2014   Donato Heinz. Owens Shark PT  [place] Levo 12/22/2015, 6:00 PM  [place], [place],  [address] Phone: [phone]   Fax:  [phone]  Name: Marissa Haney MRN: [id number] Date of Birth: 05/12/1956

## 2016-01-12 ENCOUNTER — Ambulatory Visit: Payer: Medicaid Other | Attending: Hematology and Oncology

## 2016-01-12 DIAGNOSIS — M25611 Stiffness of right shoulder, not elsewhere classified: Secondary | ICD-10-CM | POA: Insufficient documentation

## 2016-01-12 DIAGNOSIS — I890 Lymphedema, not elsewhere classified: Secondary | ICD-10-CM | POA: Insufficient documentation

## 2016-01-12 DIAGNOSIS — L599 Disorder of the skin and subcutaneous tissue related to radiation, unspecified: Secondary | ICD-10-CM | POA: Insufficient documentation

## 2016-01-12 DIAGNOSIS — M25511 Pain in right shoulder: Secondary | ICD-10-CM | POA: Insufficient documentation

## 2016-01-12 DIAGNOSIS — Z9189 Other specified personal risk factors, not elsewhere classified: Secondary | ICD-10-CM

## 2016-01-12 NOTE — Therapy (Addendum)
[place], [place], [address] Phone: [phone]   Fax:  [phone]  Physical Therapy Treatment  Patient Details  Name: Marissa Haney MRN: [id number] Date of Birth: 1955-10-28 Referring Provider: Dr. Lindi Adie  Encounter Date: 01/12/2016      PT End of Session - 01/12/16 1155    Visit Number 2   Number of Visits 4   Date for PT Re-Evaluation 02/21/16   PT Start Time 1112   PT Stop Time 1157   PT Time Calculation (min) 45 min   Activity Tolerance Patient tolerated treatment well   Behavior During Therapy [facility] for tasks assessed/performed      Past Medical History  Diagnosis Date  . Hypertension   . Cancer ([place])   . Breast cancer ([place])     right breast  . Alopecia   . Neuromuscular disorder ([facility])     neuropathy s/p chemotherapy  . Anxiety   . Family history of adverse reaction to anesthesia     sister's tongue swelled up and could not breathe  . Asthma     only gets inhaler if sick (with flu or cold for example)  . Diabetes mellitus without complication ([place])     type 2  . Urinary urgency     Past Surgical History  Procedure Laterality Date  . Abdominal hysterectomy    . Axillary lymph node biopsy    . Colonoscopy    . Portacath placement Left 06/19/2014    Procedure: INSERTION PORT-A-CATH;  Surgeon: Fanny Skates, MD;  Location: [place];  Service: General;  Laterality: Left;  . Dilation and curettage of uterus    . Breast lumpectomy with needle localization and axillary lymph node dissection Right 01/26/2015    Procedure: RIGHT PARTIAL MASTECTOMY WITH DOUBLE WIRE BRACKETED  NEEDLE LOCALIZATION AND RIGHT AXILLARY LYMPH NODE DISSECTION;  Surgeon: Fanny Skates, MD;  Location: [place];  Service: General;  Laterality: Right;  . Port-a-cath removal Left 01/26/2015    Procedure: REMOVAL PORT-A-CATH;  Surgeon: Fanny Skates, MD;  Location: [place];  Service: General;   Laterality: Left;    There were no vitals filed for this visit.      Subjective Assessment - 01/12/16 1115    Subjective Nothing new since last visit. Going to try to come to ABC class.    Pertinent History breast cancer with chemotherapy, lumpectomy with 12 lymph nodes removed  and radiation therapy.    Patient Stated Goals to get the right arm stronger    Currently in Pain? No/denies            [facility] PT Assessment - 01/12/16 0001    AROM   Right Shoulder Flexion 138 Degrees   Right Shoulder ABduction 112 Degrees   Right Shoulder External Rotation 75 Degrees                     [facility] Adult PT Treatment/Exercise - 01/12/16 0001    Shoulder Exercises: Supine   Horizontal ABduction Strengthening;Both;10 reps;Theraband   Theraband Level (Shoulder Horizontal ABduction) Level 2 (Red)   External Rotation Strengthening;Both;10 reps;Theraband   Theraband Level (Shoulder External Rotation) Level 2 (Red)   Flexion Strengthening;Both;10 reps;Theraband  Narrow and wide grip 10 times each   Theraband Level (Shoulder Flexion) Level 2 (Red)   Flexion Limitations VC througout for slower pace    Other Supine Exercises Bil D2 with red theraband 10 times each side returning therapist demonstration for  all supine exercises today.    Manual Therapy   Passive ROM In Supine to Rt shoulder in to flexion, abduction, er, and D2 all to pts tolerance.                 PT Education - 01/12/16 1130    Education provided Yes   Education Details Supine scapular series with red theraband; also began instruction for lymphedema risk reduction/infection prevention but pt plans to attend ABC class 01/18/16.    Person(s) Educated Patient   Methods Explanation;Demonstration;Handout   Comprehension Verbalized understanding;Returned demonstration;Need further instruction                [facility] Goals - 01/12/16 1228    CC Long Term Goal  #1   Title Patient with verbalize  an understanding of lymphedema risk reduction precautions   Status On-going   CC Long Term Goal  #2   Title Patient will be independent in a  home exercise program   Status On-going   CC Long Term Goal  #3   Title Patient will be know how to obtain and use compression garments /pump for lymphedema management    Status On-going   CC Long Term Goal  #4   Title Patient will be know how to obtain and use compression garments for lymphedema prophylaxis   Status On-going            Plan - 01/12/16 1209    Clinical Impression Statement Pt has been doing well with her initial HEP for AA/ROM and in fact her A/ROM measurements have improved greatly isnce her last visit, especially in abduction and er.    Rehab Potential Excellent   Clinical Impairments Affecting Rehab Potential previous radiation , 12 nodes removed    PT Frequency Other (comment)  3 visits over 2 months   PT Treatment/Interventions ADLs/Self Care Home Management;Manual lymph drainage;Compression bandaging;Neuromuscular re-education;Passive range of motion;Patient/family education;Therapeutic activities;Therapeutic exercise;Manual techniques;Taping   PT Next Visit Plan Perform manual lymph drianage, consider compression bandaging; then if can coordinate with fitter to measure when bandages come off.    PT Home Exercise Plan Cont cane exercises and begin supine scapular series with red theraband   Recommended Other Services Demographics faxed to [employer] for a compression pump and [employer] for compression garments (sleeve, glove and bra)      Patient will benefit from skilled therapeutic intervention in order to improve the following deficits and impairments:  Decreased range of motion, Impaired UE functional use, Decreased endurance, Decreased knowledge of precautions, Impaired perceived functional ability, Decreased knowledge of use of DME, Decreased strength, Increased edema, Postural dysfunction  Visit  Diagnosis: Lymphedema, not elsewhere classified  Stiffness of right shoulder, not elsewhere classified  Disorder of the skin and subcutaneous tissue related to radiation, unspecified  Shoulder stiffness, right  At risk for lymphedema  Pain in joint, shoulder region, right     Problem List Patient Active Problem List   Diagnosis Date Noted  . Chemotherapy-induced neuropathy ([place]) 08/26/2014  . Breast cancer of upper-outer quadrant of right female breast ([place]) 06/11/2014    Otelia Limes, PTA 01/12/2016, 12:29 PM  [place], [place], [address] Phone: [phone]   Fax:  [phone]  Name: Marissa Haney MRN: [id number] Date of Birth: [birth year]    PHYSICAL THERAPY DISCHARGE SUMMARY  Visits from Start of Care: 2  Current functional level related to goals / functional outcomes: unknown  Remaining deficits: Unknown    Education / Equipment: Home exercise  Plan: Patient agrees to discharge.  Patient goals were partially met. Patient is being discharged due to not returning since the last visit.  ?????    Maudry Diego, PT 08/22/17 11:44 AM

## 2016-01-12 NOTE — Patient Instructions (Signed)
Over Head Pull: Narrow and Wide Grip     Cancer Rehab [phone]   On back, knees bent, feet flat, band across thighs, elbows straight but relaxed. Pull hands apart (start). Keeping elbows straight, bring arms up and over head, hands toward floor. Keep pull steady on band. Hold momentarily. Return slowly, keeping pull steady, back to start. Then do with a wider grip. Repeat _10__ times. Band color __red____   Side Pull: Double Arm   On back, knees bent, feet flat. Arms perpendicular to body, shoulder level, elbows straight but relaxed. Pull arms out to sides, elbows straight. Resistance band comes across collarbones, hands toward floor. Hold momentarily. Slowly return to starting position. Repeat _10__ times. Band color _red____   Sword   On back, knees bent, feet flat, left hand on left hip, right hand above left. Pull right arm DIAGONALLY (hip to shoulder) across chest. Bring right arm along head toward floor. Hold momentarily. Slowly return to starting position. Repeat _10__ times. Do with left arm. Band color __red____   Shoulder Rotation: Double Arm   On back, knees bent, feet flat, elbows tucked at sides, bent 90, hands palms up. Pull hands apart and down toward floor, keeping elbows near sides. Hold momentarily. Slowly return to starting position. Repeat _10__ times. Band color __red____

## 2016-03-11 ENCOUNTER — Inpatient Hospital Stay: Admission: RE | Admit: 2016-03-11 | Payer: Self-pay | Source: Ambulatory Visit | Admitting: Radiation Oncology

## 2016-03-11 ENCOUNTER — Ambulatory Visit: Payer: Self-pay | Admitting: Radiation Oncology

## 2016-06-06 ENCOUNTER — Other Ambulatory Visit: Payer: Self-pay | Admitting: Hematology and Oncology

## 2016-06-06 DIAGNOSIS — C50411 Malignant neoplasm of upper-outer quadrant of right female breast: Secondary | ICD-10-CM

## 2016-06-08 NOTE — Assessment & Plan Note (Signed)
Right breast invasive ductal carcinoma 3.6 cm by MRI with enlarged right axillary lymph node biopsy proven to be positive for cancer , T2 N1 M0 stage 2B clinical stage.ER positive PR positive HER-2 negative Ki-67 22% Right lumpectomy 01/26/2015: IDC, positive for LVI, 5/12 lymph nodes positive with extracapsular extension, grade 1, multifocal 4 cm, 0.5 cm size E 100%, PR 97%, HER-2 negative, Ki-67 22%, T2 N2 stage IIIa Adjuvant radiation therapy 03/23/2015 to 05/06/2015  Current treatment: Anastrozole 1 mg daily started 05/19/2015 Anastrozole toxicities: tolerating it extremely well without any side effects.  Hypertension: Patient's primary care physician changed her medications which she is yet to pick up at the pharmacy.  Breast Cancer Surveillance: 1. Breast exam 06/09/2016: Normal 2. Mammogram 11/24/2015 No abnormalities. Postsurgical changes. Breast Density Category B. I recommended that she get 3-D mammograms for surveillance. Discussed the differences between different breast density categories.  Difficulty with range of motion of right arm: got physical therapy.   Return to clinic in 1 yr for follow up.

## 2016-06-09 ENCOUNTER — Encounter: Payer: Self-pay | Admitting: Hematology and Oncology

## 2016-06-09 ENCOUNTER — Ambulatory Visit (HOSPITAL_BASED_OUTPATIENT_CLINIC_OR_DEPARTMENT_OTHER): Payer: Medicaid Other | Admitting: Hematology and Oncology

## 2016-06-09 VITALS — BP 124/50 | HR 62 | Temp 97.7°F | Resp 18 | Ht 67.0 in | Wt 230.5 lb

## 2016-06-09 DIAGNOSIS — C773 Secondary and unspecified malignant neoplasm of axilla and upper limb lymph nodes: Secondary | ICD-10-CM | POA: Diagnosis not present

## 2016-06-09 DIAGNOSIS — G620 Drug-induced polyneuropathy: Secondary | ICD-10-CM

## 2016-06-09 DIAGNOSIS — I10 Essential (primary) hypertension: Secondary | ICD-10-CM | POA: Diagnosis not present

## 2016-06-09 DIAGNOSIS — Z170 Estrogen receptor positive status [ER+]: Secondary | ICD-10-CM | POA: Diagnosis not present

## 2016-06-09 DIAGNOSIS — C50411 Malignant neoplasm of upper-outer quadrant of right female breast: Secondary | ICD-10-CM | POA: Diagnosis not present

## 2016-06-09 DIAGNOSIS — T451X5A Adverse effect of antineoplastic and immunosuppressive drugs, initial encounter: Principal | ICD-10-CM

## 2016-06-09 MED ORDER — ANASTROZOLE 1 MG PO TABS: 1.0000 mg | ORAL_TABLET | Freq: Every day | ORAL | 3 refills | Status: DC

## 2016-06-09 NOTE — Progress Notes (Signed)
Patient Care Team: Cyndi Bender, PA-C as PCP - General (Physician Assistant) Nicholas Lose, MD as Consulting Physician (Hematology and Oncology) Fanny Skates, MD as Consulting Physician (General Surgery) Kyung Rudd, MD as Consulting Physician (Radiation Oncology) Sylvan Cheese, NP as Nurse Practitioner (Hematology and Oncology)  DIAGNOSIS:  Encounter Diagnoses  Name Primary?  . Malignant neoplasm of upper-outer quadrant of right breast in female, estrogen receptor positive ([place])   . Chemotherapy-induced neuropathy ([place]) Yes    SUMMARY OF ONCOLOGIC HISTORY:   Breast cancer of upper-outer quadrant of right female breast ([place])   05/27/2014 Mammogram    Right breast mass by mammogram 5.4 cm by ultrasound 4 x 4 X 2 cm      05/28/2014 Initial Biopsy    Right breast biopsy 11:30: Invasive ductal carcinoma with lymphovascular invasion grade 1/2, ER 100%, PR 97%, Ki-67 22%, HER-2 negative ratio 1.26 average copy #1.7, lymph node biopsy also positive      06/16/2014 Breast MRI    right breast cancer: 3.6 cm at 12:00 position mildly enlarged right axillary lymph node: 5 mm oval enhancing mass in the central left breast could be an intramammary lymph node      06/16/2014 Clinical Stage    Stage IIB (T2 N1)        07/10/2014 - 11/11/2014 Neo-Adjuvant Chemotherapy    Dose dense Adriamycin and Cytoxan 4 followed by Abraxane 12      07/10/2014 Procedure    PREVENT clinical trial (Lipitor vs Placebo) in patients undergoing cardiotoxic chemotherapy      12/08/2014 Breast MRI    Mass appears smaller, now measuring 2.3 x 4.9 x 2.7 cm. Previously using the same dimensions mass measured 3.4 x 4.6 x 2.7 cm. Improvement of axill LN      01/26/2015 Definitive Surgery    Right lumpectomy: IDC, positive for LVI, 5/12 lymph nodes positive with extracapsular extension, grade 1, multifocal 4 cm, 0.5 cm size E 100%, PR 97%, HER-2 negative, Ki-67 22%      01/26/2015 Pathologic Stage   Stage IIIA: T2 N2      03/23/2015 - 05/06/2015 Radiation Therapy    Adjuvant breast radiation [facility]): Right breast 50.4 Gy over 28 fractions; right supraclavicular 50.4 Gy; right breast boost: 10 Gy over 5 fractions.      05/19/2015 -  Anti-estrogen oral therapy    Anastrozole 1 mg daily      06/19/2015 Survivorship    Survivorship care plan given to patient       CHIEF COMPLIANT: Follow-up on anastrozole  INTERVAL HISTORY: Marissa Haney is a 60 year old with above-mentioned history of right breast cancer treated with lumpectomy and adjuvant radiation and she is currently on anastrozole since November 2016. She is tolerating anastrozole extremely well without any major problems. She has difficulty with range of motion of her right arm and she is doing much better after having undergone physical therapy. She denies any hot flashes. Denies any lumps or nodules in breast.  REVIEW OF SYSTEMS:   Constitutional: Denies fevers, chills or abnormal weight loss Eyes: Denies blurriness of vision Ears, nose, mouth, throat, and face: Denies mucositis or sore throat Respiratory: Denies cough, dyspnea or wheezes Cardiovascular: Denies palpitation, chest discomfort Gastrointestinal:  Denies nausea, heartburn or change in bowel habits Skin: Denies abnormal skin rashes Lymphatics: Denies new lymphadenopathy or easy bruising Neurological:Denies numbness, tingling or new weaknesses Behavioral/Psych: Mood is stable, no new changes  Extremities: No lower extremity edema Breast:  denies any pain or  lumps or nodules in either breasts All other systems were reviewed with the patient and are negative.  I have reviewed the past medical history, past surgical history, social history and family history with the patient and they are unchanged from previous note.  ALLERGIES:  has No Known Allergies.  MEDICATIONS:  Current Outpatient Prescriptions  Medication Sig Dispense Refill  . ALPRAZolam (XANAX)  0.25 MG tablet Take 1 tablet (0.25 mg total) by mouth 2 (two) times daily as needed for anxiety. 30 tablet 0  . anastrozole (ARIMIDEX) 1 MG tablet Take 1 tablet (1 mg total) by mouth daily. 90 tablet 3  . glipiZIDE (GLUCOTROL) 5 MG tablet Take 5 mg by mouth 2 (two) times daily before a meal.    . lisinopril (PRINIVIL,ZESTRIL) 10 MG tablet Take 1 tablet (10 mg total) by mouth daily. 30 tablet 3  . meclizine (ANTIVERT) 25 MG tablet Take 1 tablet (25 mg total) by mouth 2 (two) times daily. 60 tablet 3  . metoprolol (LOPRESSOR) 50 MG tablet Take 1 tablet (50 mg total) by mouth 2 (two) times daily. 60 tablet 1   No current facility-administered medications for this visit.     PHYSICAL EXAMINATION: ECOG PERFORMANCE STATUS: 1 - Symptomatic but completely ambulatory  Vitals:   06/09/16 0943  BP: (!) 124/50  Pulse: 62  Resp: 18  Temp: 97.7 F (36.5 C)   Filed Weights   06/09/16 0943  Weight: 230 lb 8 oz (104.6 kg)    GENERAL:alert, no distress and comfortable SKIN: skin color, texture, turgor are normal, no rashes or significant lesions EYES: normal, Conjunctiva are pink and non-injected, sclera clear OROPHARYNX:no exudate, no erythema and lips, buccal mucosa, and tongue normal  NECK: supple, thyroid normal size, non-tender, without nodularity LYMPH:  no palpable lymphadenopathy in the cervical, axillary or inguinal LUNGS: clear to auscultation and percussion with normal breathing effort HEART: regular rate & rhythm and no murmurs and no lower extremity edema ABDOMEN:abdomen soft, non-tender and normal bowel sounds MUSCULOSKELETAL:no cyanosis of digits and no clubbing  NEURO: alert & oriented x 3 with fluent speech, no focal motor/sensory deficits EXTREMITIES: No lower extremity edema BREAST: No palpable masses or nodules in either right or left breasts. Postsurgical changes in the left breast. No palpable axillary supraclavicular or infraclavicular adenopathy no breast tenderness or  nipple discharge. (exam performed in the presence of a chaperone)  LABORATORY DATA:  I have reviewed the data as listed   Chemistry      Component Value Date/Time   NA 139 01/20/2015 1045   NA 142 11/18/2014 1406   K 3.3 (L) 01/20/2015 1045   K 3.5 11/18/2014 1406   CL 105 01/20/2015 1045   CO2 26 01/20/2015 1045   CO2 26 11/18/2014 1406   BUN 9 01/20/2015 1045   BUN 9.0 11/18/2014 1406   CREATININE 0.82 01/26/2015 1906   CREATININE 0.9 11/18/2014 1406      Component Value Date/Time   CALCIUM 8.7 (L) 01/20/2015 1045   CALCIUM 9.1 11/18/2014 1406   ALKPHOS 122 01/20/2015 1045   ALKPHOS 94 11/18/2014 1406   AST 21 01/20/2015 1045   AST 16 11/18/2014 1406   ALT 17 01/20/2015 1045   ALT 15 11/18/2014 1406   BILITOT 1.1 01/20/2015 1045   BILITOT 0.57 11/18/2014 1406       Lab Results  Component Value Date   WBC 13.4 (H) 01/26/2015   HGB 13.2 01/26/2015   HCT 39.6 01/26/2015   MCV 94.5 01/26/2015  PLT 189 01/26/2015   NEUTROABS 4.9 01/20/2015    ASSESSMENT & PLAN:  Breast cancer of upper-outer quadrant of right female breast ([place]) Right breast invasive ductal carcinoma 3.6 cm by MRI with enlarged right axillary lymph node biopsy proven to be positive for cancer , T2 N1 M0 stage 2B clinical stage.ER positive PR positive HER-2 negative Ki-67 22% Right lumpectomy 01/26/2015: IDC, positive for LVI, 5/12 lymph nodes positive with extracapsular extension, grade 1, multifocal 4 cm, 0.5 cm size E 100%, PR 97%, HER-2 negative, Ki-67 22%, T2 N2 stage IIIa Adjuvant radiation therapy 03/23/2015 to 05/06/2015  Current treatment: Anastrozole 1 mg daily started 05/19/2015 Anastrozole toxicities: tolerating it extremely well without any side effects.  Hypertension: Doing much better Patient and job and W and is very happy about it.  Breast Cancer Surveillance: 1. Breast exam 06/09/2016: Normal 2. Mammogram 11/24/2015 No abnormalities. Postsurgical changes. Breast Density  Category B. I recommended that she get 3-D mammograms for surveillance. Discussed the differences between different breast density categories.  Difficulty with range of motion of right arm: got physical therapy.   Return to clinic in 1 yr for follow up.   No orders of the defined types were placed in this encounter.  The patient has a good understanding of the overall plan. she agrees with it. she will call with any problems that may develop before the next visit here.   Rulon Eisenmenger, MD 06/09/16

## 2016-07-12 ENCOUNTER — Telehealth: Payer: Self-pay | Admitting: *Deleted
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
## 2016-08-22 ENCOUNTER — Other Ambulatory Visit: Payer: Self-pay | Admitting: Student

## 2016-08-22 DIAGNOSIS — M7582 Other shoulder lesions, left shoulder: Secondary | ICD-10-CM

## 2016-08-23 ENCOUNTER — Other Ambulatory Visit: Payer: Self-pay | Admitting: Student

## 2016-08-23 DIAGNOSIS — M7582 Other shoulder lesions, left shoulder: Secondary | ICD-10-CM

## 2016-12-12 DIAGNOSIS — S4352XD Sprain of left acromioclavicular joint, subsequent encounter: Secondary | ICD-10-CM | POA: Diagnosis not present

## 2016-12-12 DIAGNOSIS — M7550 Bursitis of unspecified shoulder: Secondary | ICD-10-CM | POA: Diagnosis not present

## 2016-12-12 DIAGNOSIS — M7582 Other shoulder lesions, left shoulder: Secondary | ICD-10-CM | POA: Diagnosis not present

## 2017-01-13 DIAGNOSIS — M7550 Bursitis of unspecified shoulder: Secondary | ICD-10-CM | POA: Diagnosis not present

## 2017-01-13 DIAGNOSIS — M7582 Other shoulder lesions, left shoulder: Secondary | ICD-10-CM | POA: Diagnosis not present

## 2017-02-10 DIAGNOSIS — M7582 Other shoulder lesions, left shoulder: Secondary | ICD-10-CM | POA: Diagnosis not present

## 2017-02-10 DIAGNOSIS — M7550 Bursitis of unspecified shoulder: Secondary | ICD-10-CM | POA: Diagnosis not present

## 2017-02-10 DIAGNOSIS — S4352XD Sprain of left acromioclavicular joint, subsequent encounter: Secondary | ICD-10-CM | POA: Diagnosis not present

## 2017-02-24 ENCOUNTER — Telehealth: Payer: Self-pay

## 2017-02-24 NOTE — Telephone Encounter (Signed)
Spoke with pt regarding the need for financial assistance with her anastrozole prescription. Pt states that its been 3-4 days since she took a pill. Pt lost her [employer] and waiting for medicare to take effect. Requested information for possible resources from financial specialist and was provided with outreach patient assistance application. Told pt that we will be sending this out in the mail today and she will need to provide all her information in order to obtain anastrozole medication through mail order. It will be $30.00 for 90 days. Pt was very grateful for the resource and will get back on anastrozole as soon as its available. Told pt to call with any more questions or concerns.

## 2017-04-05 DIAGNOSIS — S4352XD Sprain of left acromioclavicular joint, subsequent encounter: Secondary | ICD-10-CM | POA: Diagnosis not present

## 2017-04-05 DIAGNOSIS — M7582 Other shoulder lesions, left shoulder: Secondary | ICD-10-CM | POA: Diagnosis not present

## 2017-04-05 DIAGNOSIS — M7550 Bursitis of unspecified shoulder: Secondary | ICD-10-CM | POA: Diagnosis not present

## 2017-05-22 ENCOUNTER — Other Ambulatory Visit: Payer: Self-pay

## 2017-05-22 ENCOUNTER — Encounter
Admission: RE | Admit: 2017-05-22 | Discharge: 2017-05-22 | Disposition: A | Discharge status: Home / Self Care | Payer: Medicare Other | Source: Ambulatory Visit | Attending: Surgery | Admitting: Surgery

## 2017-05-22 DIAGNOSIS — I10 Essential (primary) hypertension: Secondary | ICD-10-CM | POA: Insufficient documentation

## 2017-05-22 DIAGNOSIS — E119 Type 2 diabetes mellitus without complications: Secondary | ICD-10-CM | POA: Insufficient documentation

## 2017-05-22 DIAGNOSIS — Z01818 Encounter for other preprocedural examination: Secondary | ICD-10-CM | POA: Insufficient documentation

## 2017-05-22 DIAGNOSIS — M7582 Other shoulder lesions, left shoulder: Secondary | ICD-10-CM | POA: Diagnosis not present

## 2017-05-22 LAB — BASIC METABOLIC PANEL
Anion gap: 8 (ref 5–15)
BUN: 15 mg/dL (ref 6–20)
CALCIUM: 8.7 mg/dL — AB (ref 8.9–10.3)
CO2: 28 mmol/L (ref 22–32)
CREATININE: 0.9 mg/dL (ref 0.44–1.00)
Chloride: 102 mmol/L (ref 101–111)
GFR calc non Af Amer: >60 mL/min (ref >60)
Glucose, Bld: 269 mg/dL — ABNORMAL HIGH (ref 65–99)
Potassium: 3.4 mmol/L — ABNORMAL LOW (ref 3.5–5.1)
SODIUM: 138 mmol/L (ref 135–145)

## 2017-05-22 NOTE — Patient Instructions (Signed)
Your procedure is scheduled on:05/30/17 Report to Day Surgery. Medical mall second floor To find out your arrival time please call [phone] between 1PM - 3PM on 05/29/17.  Remember: Instructions that are not followed completely may result in serious medical risk, up to and including death, or upon the discretion of your surgeon and anesthesiologist your surgery may need to be rescheduled.     _X__ 1. Do not eat food after midnight the night before your procedure.                 No gum chewing or hard candies. You may drink clear liquids up to 2 hours                 before you are scheduled to arrive for your surgery- DO not drink clear                 liquids within 2 hours of the start of your surgery.                 Clear Liquids include:  water, apple juice without pulp, clear carbohydrate                 drink such as Clearfast of Gartorade, Black Coffee or Tea (Do not add                 anything to coffee or tea).  ONLY WATER AFTER MIDNIGHT SINCE YOU ARE DIABETIC  _X__ 2.  No Alcohol for 24 hours before or after surgery.   _X__ 3.  Do Not Smoke or use e-cigarettes For 24 Hours Prior to Your Surgery.                 Do not use any chewable tobacco products for at least 6 hours prior to                 surgery.  ____  4.  Bring all medications with you on the day of surgery if instructed.   ____  5.  Notify your doctor if there is any change in your medical condition      (cold, fever, infections).     Do not wear jewelry, make-up, hairpins, clips or nail polish. Do not wear lotions, powders, or perfumes. You may wear deodorant. Do not shave 48 hours prior to surgery. Men may shave face and neck. Do not bring valuables to the hospital.    [facility] is not responsible for any belongings or valuables.  Contacts, dentures or bridgework may not be worn into surgery. Leave your suitcase in the car. After surgery it may be brought to your room. For  patients admitted to the hospital, discharge time is determined by your treatment team.   Patients discharged the day of surgery will not be allowed to drive home.   Please read over the following fact sheets that you were given:   Surgical Site Infection Prevention          ____ Take these medicines the morning of surgery with A SIP OF WATER:    1. NONE  2.   3.   4.  5.  6.  ____ Fleet Enema (as directed)   _X___ Use CHG Soap as directed  ____ Use inhalers on the day of surgery  _X___ Stop metformin 2 days prior to surgery    ____ Take 1/2 of usual insulin dose the night before surgery. No insulin the morning  of surgery.   ____ Stop Coumadin/Plavix/aspirin on ____ Stop Anti-inflammatories on   ____ Stop supplements until after surgery.    ____ Bring C-Pap to the hospital.

## 2017-05-23 NOTE — Pre-Procedure Instructions (Signed)
Met B sent to Dr. Roland Rack and Anesthesia for review.

## 2017-05-29 MED ORDER — CEFAZOLIN SODIUM-DEXTROSE 2-4 GM/100ML-% IV SOLN
2.0000 g | Freq: Once | INTRAVENOUS | Status: AC
  Administered 2017-05-30: 2 g via INTRAVENOUS

## 2017-05-30 ENCOUNTER — Encounter: Payer: Self-pay | Admitting: Anesthesiology

## 2017-05-30 ENCOUNTER — Ambulatory Visit
Admission: RE | Admit: 2017-05-30 | Discharge: 2017-05-30 | Disposition: A | Discharge status: Home Health | Payer: Medicare Other | Source: Ambulatory Visit | Attending: Surgery | Admitting: Surgery

## 2017-05-30 ENCOUNTER — Encounter
Admission: RE | Disposition: A | Discharge status: Home Health | Payer: Self-pay | Source: Ambulatory Visit | Attending: Surgery

## 2017-05-30 ENCOUNTER — Ambulatory Visit: Payer: Medicare Other | Admitting: Anesthesiology

## 2017-05-30 DIAGNOSIS — E119 Type 2 diabetes mellitus without complications: Secondary | ICD-10-CM | POA: Diagnosis not present

## 2017-05-30 DIAGNOSIS — Z79899 Other long term (current) drug therapy: Secondary | ICD-10-CM | POA: Diagnosis not present

## 2017-05-30 DIAGNOSIS — M7582 Other shoulder lesions, left shoulder: Secondary | ICD-10-CM | POA: Diagnosis not present

## 2017-05-30 DIAGNOSIS — Z853 Personal history of malignant neoplasm of breast: Secondary | ICD-10-CM | POA: Insufficient documentation

## 2017-05-30 DIAGNOSIS — G8918 Other acute postprocedural pain: Secondary | ICD-10-CM | POA: Diagnosis not present

## 2017-05-30 DIAGNOSIS — Z7984 Long term (current) use of oral hypoglycemic drugs: Secondary | ICD-10-CM | POA: Diagnosis not present

## 2017-05-30 DIAGNOSIS — M7552 Bursitis of left shoulder: Secondary | ICD-10-CM | POA: Diagnosis not present

## 2017-05-30 DIAGNOSIS — M67912 Unspecified disorder of synovium and tendon, left shoulder: Secondary | ICD-10-CM | POA: Insufficient documentation

## 2017-05-30 DIAGNOSIS — M7542 Impingement syndrome of left shoulder: Secondary | ICD-10-CM | POA: Diagnosis not present

## 2017-05-30 DIAGNOSIS — M25512 Pain in left shoulder: Secondary | ICD-10-CM | POA: Diagnosis not present

## 2017-05-30 DIAGNOSIS — M19012 Primary osteoarthritis, left shoulder: Secondary | ICD-10-CM | POA: Insufficient documentation

## 2017-05-30 DIAGNOSIS — M7522 Bicipital tendinitis, left shoulder: Secondary | ICD-10-CM | POA: Diagnosis not present

## 2017-05-30 DIAGNOSIS — I10 Essential (primary) hypertension: Secondary | ICD-10-CM | POA: Diagnosis not present

## 2017-05-30 HISTORY — PX: SHOULDER ARTHROSCOPY WITH ROTATOR CUFF REPAIR AND OPEN BICEPS TENODESIS: SHX6677

## 2017-05-30 LAB — GLUCOSE, CAPILLARY
GLUCOSE-CAPILLARY: 212 mg/dL — AB (ref 65–99)
GLUCOSE-CAPILLARY: 186 mg/dL — AB (ref 65–99)

## 2017-05-30 SURGERY — SHOULDER ARTHROSCOPY WITH ROTATOR CUFF REPAIR AND OPEN BICEPS TENODESIS
Anesthesia: General | Laterality: Left

## 2017-05-30 MED ORDER — MIDAZOLAM HCL 2 MG/2ML IJ SOLN
INTRAMUSCULAR | Status: DC | PRN
  Administered 2017-05-30: 2 mg via INTRAVENOUS

## 2017-05-30 MED ORDER — SUCCINYLCHOLINE CHLORIDE 20 MG/ML IJ SOLN
INTRAMUSCULAR | Status: DC | PRN
  Administered 2017-05-30: 100 mg via INTRAVENOUS

## 2017-05-30 MED ORDER — PHENYLEPHRINE HCL 10 MG/ML IJ SOLN
INTRAMUSCULAR | Status: DC | PRN
  Administered 2017-05-30: 100 ug via INTRAVENOUS

## 2017-05-30 MED ORDER — LABETALOL HCL 5 MG/ML IV SOLN
10.0000 mg | INTRAVENOUS | Status: DC | PRN
  Administered 2017-05-30: 10 mg via INTRAVENOUS

## 2017-05-30 MED ORDER — ONDANSETRON HCL 4 MG/2ML IJ SOLN
INTRAMUSCULAR | Status: DC | PRN
  Administered 2017-05-30: 4 mg via INTRAVENOUS

## 2017-05-30 MED ORDER — PROPOFOL 10 MG/ML IV BOLUS
INTRAVENOUS | Status: AC
  Filled 2017-05-30: qty 20

## 2017-05-30 MED ORDER — SUGAMMADEX SODIUM 500 MG/5ML IV SOLN
INTRAVENOUS | Status: AC
  Filled 2017-05-30: qty 5

## 2017-05-30 MED ORDER — ROPIVACAINE HCL 5 MG/ML IJ SOLN
INTRAMUSCULAR | Status: AC
  Filled 2017-05-30: qty 30

## 2017-05-30 MED ORDER — ROPIVACAINE HCL 5 MG/ML IJ SOLN
INTRAMUSCULAR | Status: DC | PRN
  Administered 2017-05-30: 30 mL via PERINEURAL

## 2017-05-30 MED ORDER — FAMOTIDINE 20 MG PO TABS
ORAL_TABLET | ORAL | Status: AC
  Administered 2017-05-30: 20 mg via ORAL
  Filled 2017-05-30: qty 1

## 2017-05-30 MED ORDER — ONDANSETRON HCL 4 MG PO TABS: 4.0000 mg | ORAL_TABLET | Freq: Four times a day (QID) | ORAL | Status: DC | PRN

## 2017-05-30 MED ORDER — BUPIVACAINE-EPINEPHRINE 0.5% -1:200000 IJ SOLN
INTRAMUSCULAR | Status: DC | PRN
Start: 2017-05-30 — End: 2017-05-30
  Administered 2017-05-30: 30 mL

## 2017-05-30 MED ORDER — METOCLOPRAMIDE HCL 10 MG PO TABS: 5.0000 mg | ORAL_TABLET | Freq: Three times a day (TID) | ORAL | Status: DC | PRN

## 2017-05-30 MED ORDER — FENTANYL CITRATE (PF) 100 MCG/2ML IJ SOLN
INTRAMUSCULAR | Status: AC
  Filled 2017-05-30: qty 2

## 2017-05-30 MED ORDER — PROPOFOL 10 MG/ML IV BOLUS
INTRAVENOUS | Status: DC | PRN
  Administered 2017-05-30: 200 mg via INTRAVENOUS

## 2017-05-30 MED ORDER — FAMOTIDINE 20 MG PO TABS
20.0000 mg | ORAL_TABLET | Freq: Once | ORAL | Status: AC
  Administered 2017-05-30: 20 mg via ORAL

## 2017-05-30 MED ORDER — ROCURONIUM BROMIDE 100 MG/10ML IV SOLN
INTRAVENOUS | Status: DC | PRN
  Administered 2017-05-30: 50 mg via INTRAVENOUS

## 2017-05-30 MED ORDER — SODIUM CHLORIDE 0.9 % IV SOLN
INTRAVENOUS | Status: DC
  Administered 2017-05-30: 12:00:00 via INTRAVENOUS

## 2017-05-30 MED ORDER — LACTATED RINGERS IV SOLN
INTRAVENOUS | Status: DC | PRN
  Administered 2017-05-30: 2 mL

## 2017-05-30 MED ORDER — ONDANSETRON HCL 4 MG/2ML IJ SOLN: 4.0000 mg | Freq: Four times a day (QID) | INTRAMUSCULAR | Status: DC | PRN

## 2017-05-30 MED ORDER — BUPIVACAINE-EPINEPHRINE (PF) 0.5% -1:200000 IJ SOLN
INTRAMUSCULAR | Status: AC
  Filled 2017-05-30: qty 30

## 2017-05-30 MED ORDER — EPINEPHRINE PF 1 MG/ML IJ SOLN
INTRAMUSCULAR | Status: AC
  Filled 2017-05-30: qty 1

## 2017-05-30 MED ORDER — OXYCODONE HCL 5 MG PO TABS: 5.0000 mg | ORAL_TABLET | ORAL | 0 refills | Status: DC | PRN

## 2017-05-30 MED ORDER — FENTANYL CITRATE (PF) 100 MCG/2ML IJ SOLN: 25.0000 ug | INTRAMUSCULAR | Status: DC | PRN

## 2017-05-30 MED ORDER — ROCURONIUM BROMIDE 50 MG/5ML IV SOLN
INTRAVENOUS | Status: AC
  Filled 2017-05-30: qty 1

## 2017-05-30 MED ORDER — FENTANYL CITRATE (PF) 100 MCG/2ML IJ SOLN
INTRAMUSCULAR | Status: DC | PRN
  Administered 2017-05-30 (×2): 100 ug via INTRAVENOUS

## 2017-05-30 MED ORDER — LIDOCAINE HCL (PF) 1 % IJ SOLN
INTRAMUSCULAR | Status: DC | PRN
  Administered 2017-05-30: 5 mL via SUBCUTANEOUS

## 2017-05-30 MED ORDER — LIDOCAINE HCL (PF) 2 % IJ SOLN
INTRAMUSCULAR | Status: AC
  Filled 2017-05-30: qty 10

## 2017-05-30 MED ORDER — LABETALOL HCL 5 MG/ML IV SOLN
INTRAVENOUS | Status: AC
  Filled 2017-05-30: qty 4

## 2017-05-30 MED ORDER — ONDANSETRON HCL 4 MG/2ML IJ SOLN
INTRAMUSCULAR | Status: AC
  Filled 2017-05-30: qty 2

## 2017-05-30 MED ORDER — POTASSIUM CHLORIDE IN NACL 20-0.9 MEQ/L-% IV SOLN: INTRAVENOUS | Status: DC

## 2017-05-30 MED ORDER — METOCLOPRAMIDE HCL 5 MG/ML IJ SOLN: 5.0000 mg | Freq: Three times a day (TID) | INTRAMUSCULAR | Status: DC | PRN

## 2017-05-30 MED ORDER — SUGAMMADEX SODIUM 500 MG/5ML IV SOLN
INTRAVENOUS | Status: DC | PRN
  Administered 2017-05-30: 210 mg via INTRAVENOUS

## 2017-05-30 MED ORDER — MIDAZOLAM HCL 2 MG/2ML IJ SOLN
INTRAMUSCULAR | Status: AC
  Administered 2017-05-30: 1 mg
  Filled 2017-05-30: qty 2

## 2017-05-30 MED ORDER — LIDOCAINE HCL (PF) 1 % IJ SOLN
INTRAMUSCULAR | Status: AC
  Filled 2017-05-30: qty 2

## 2017-05-30 MED ORDER — FENTANYL CITRATE (PF) 100 MCG/2ML IJ SOLN: 50.0000 ug | Freq: Once | INTRAMUSCULAR | Status: DC

## 2017-05-30 MED ORDER — LIDOCAINE HCL (PF) 1 % IJ SOLN
INTRAMUSCULAR | Status: AC
  Filled 2017-05-30: qty 5

## 2017-05-30 MED ORDER — LIDOCAINE HCL (CARDIAC) 20 MG/ML IV SOLN
INTRAVENOUS | Status: DC | PRN
  Administered 2017-05-30: 50 mg via INTRAVENOUS

## 2017-05-30 MED ORDER — METOPROLOL TARTRATE 5 MG/5ML IV SOLN
INTRAVENOUS | Status: AC
  Filled 2017-05-30: qty 5

## 2017-05-30 MED ORDER — MIDAZOLAM HCL 2 MG/2ML IJ SOLN
INTRAMUSCULAR | Status: AC
  Filled 2017-05-30: qty 2

## 2017-05-30 MED ORDER — OXYCODONE HCL 5 MG PO TABS: 5.0000 mg | ORAL_TABLET | ORAL | Status: DC | PRN

## 2017-05-30 MED ORDER — MIDAZOLAM HCL 2 MG/2ML IJ SOLN: 1.0000 mg | Freq: Once | INTRAMUSCULAR | Status: DC

## 2017-05-30 MED ORDER — PROMETHAZINE HCL 25 MG/ML IJ SOLN: 6.2500 mg | INTRAMUSCULAR | Status: DC | PRN

## 2017-05-30 MED ORDER — FENTANYL CITRATE (PF) 100 MCG/2ML IJ SOLN
INTRAMUSCULAR | Status: AC
  Administered 2017-05-30: 50 ug
  Filled 2017-05-30: qty 2

## 2017-05-30 MED ORDER — CEFAZOLIN SODIUM-DEXTROSE 2-4 GM/100ML-% IV SOLN
INTRAVENOUS | Status: AC
Start: 2017-05-30 — End: 2017-05-30
  Filled 2017-05-30: qty 100

## 2017-05-30 SURGICAL SUPPLY — 45 items
BIT DRILL JUGRKNT W/NDL BIT2.9 (DRILL) IMPLANT
BLADE FULL RADIUS 3.5 (BLADE) ×3 IMPLANT
BUR ACROMIONIZER 4.0 (BURR) ×3 IMPLANT
CANNULA SHAVER 8MMX76MM (CANNULA) ×3 IMPLANT
CHLORAPREP W/TINT 26ML (MISCELLANEOUS) ×3 IMPLANT
COVER MAYO STAND STRL (DRAPES) ×3 IMPLANT
DRAPE IMP U-DRAPE 54X76 (DRAPES) ×6 IMPLANT
DRILL JUGGERKNOT W/NDL BIT 2.9 (DRILL)
DRSG OPSITE POSTOP 4X8 (GAUZE/BANDAGES/DRESSINGS) ×3 IMPLANT
ELECT REM PT RETURN 9FT ADLT (ELECTROSURGICAL) ×3
ELECTRODE REM PT RTRN 9FT ADLT (ELECTROSURGICAL) ×1 IMPLANT
GAUZE PETRO XEROFOAM 1X8 (MISCELLANEOUS) ×3 IMPLANT
GAUZE SPONGE 4X4 12PLY STRL (GAUZE/BANDAGES/DRESSINGS) ×3 IMPLANT
GLOVE BIO SURGEON STRL SZ7.5 (GLOVE) ×6 IMPLANT
GLOVE BIO SURGEON STRL SZ8 (GLOVE) ×6 IMPLANT
GLOVE BIOGEL PI IND STRL 8 (GLOVE) ×1 IMPLANT
GLOVE BIOGEL PI INDICATOR 8 (GLOVE) ×2
GLOVE INDICATOR 8.0 STRL GRN (GLOVE) ×3 IMPLANT
GOWN STRL REUS W/ TWL LRG LVL3 (GOWN DISPOSABLE) ×1 IMPLANT
GOWN STRL REUS W/ TWL XL LVL3 (GOWN DISPOSABLE) ×1 IMPLANT
GOWN STRL REUS W/TWL LRG LVL3 (GOWN DISPOSABLE) ×3
GOWN STRL REUS W/TWL XL LVL3 (GOWN DISPOSABLE) ×3
GRASPER SUT 15 45D LOW PRO (SUTURE) IMPLANT
IV LACTATED RINGER IRRG 3000ML (IV SOLUTION) ×15
IV LR IRRIG 3000ML ARTHROMATIC (IV SOLUTION) ×2 IMPLANT
MANIFOLD NEPTUNE II (INSTRUMENTS) ×3 IMPLANT
MASK FACE SPIDER DISP (MASK) ×3 IMPLANT
MAT BLUE FLOOR 46X72 FLO (MISCELLANEOUS) ×3 IMPLANT
NDL MAYO CATGUT SZ5 (NEEDLE)
NDL REVERSE CUT 1/2 CRC (NEEDLE) IMPLANT
NDL SUT 5 .5 CRC TPR PNT MAYO (NEEDLE) IMPLANT
NEEDLE REVERSE CUT 1/2 CRC (NEEDLE) IMPLANT
PACK ARTHROSCOPY SHOULDER (MISCELLANEOUS) ×3 IMPLANT
SLING ARM LRG DEEP (SOFTGOODS) ×3 IMPLANT
SLING ULTRA II LG (MISCELLANEOUS) ×3 IMPLANT
STAPLER SKIN PROX 35W (STAPLE) ×3 IMPLANT
STRAP SAFETY BODY (MISCELLANEOUS) ×3 IMPLANT
SUT ETHIBOND 0 MO6 C/R (SUTURE) ×3 IMPLANT
SUT VIC AB 2-0 CT1 27 (SUTURE) ×6
SUT VIC AB 2-0 CT1 TAPERPNT 27 (SUTURE) ×2 IMPLANT
TAPE MICROFOAM 4IN (TAPE) ×3 IMPLANT
TUBING ARTHRO INFLOW-ONLY STRL (TUBING) ×3 IMPLANT
TUBING CONNECTING 10 (TUBING) ×2 IMPLANT
TUBING CONNECTING 10' (TUBING) ×1
WAND HAND CNTRL MULTIVAC 90 (MISCELLANEOUS) ×3 IMPLANT

## 2017-05-30 NOTE — Progress Notes (Signed)
Last blood pressure 181/93  Dr piscitello good with this

## 2017-05-30 NOTE — Transfer of Care (Signed)
Immediate Anesthesia Transfer of Care Note  Patient: Conley Canal  Procedure(s) Performed: SHOULDER ARTHROSCOPY WITH ROTATOR CUFF REPAIR AND OPEN BICEPS TENODESIS (Left )  Patient Location: PACU  Anesthesia Type:GA combined with regional for post-op pain  Level of Consciousness: awake, oriented and patient cooperative  Airway & Oxygen Therapy: Patient Spontanous Breathing and Patient connected to face mask oxygen  Post-op Assessment: Report given to RN and Post -op Vital signs reviewed and stable  Post vital signs: Reviewed and stable  Last Vitals:  Vitals:   05/30/17 1335 05/30/17 1557  BP: (!) 150/73 (!) (P) 179/94  Pulse: 67   Resp: 17   Temp:  (P) 36.4 C  SpO2: 100%     Last Pain:  Vitals:   05/30/17 1219  TempSrc: Oral         Complications: No apparent anesthesia complications

## 2017-05-30 NOTE — H&P (Signed)
Paper H&P to be scanned into permanent record. H&P reviewed and patient re-examined. No changes. 

## 2017-05-30 NOTE — OR Nursing (Signed)
Per Dr Rosey Bath placed IV #[id number] in left foot. Surgery is posted for left side and patient states she has history of lymphedema on right arm as a result of breast surgery.

## 2017-05-30 NOTE — Anesthesia Postprocedure Evaluation (Signed)
Anesthesia Post Note  Patient: ZISSY HAMLETT  Procedure(s) Performed: SHOULDER ARTHROSCOPY WITH ROTATOR CUFF REPAIR AND OPEN BICEPS TENODESIS (Left )  Patient location during evaluation: PACU Anesthesia Type: General Level of consciousness: awake and alert Pain management: pain level controlled Vital Signs Assessment: post-procedure vital signs reviewed and stable Respiratory status: spontaneous breathing, nonlabored ventilation, respiratory function stable and patient connected to nasal cannula oxygen Cardiovascular status: blood pressure returned to baseline and stable Postop Assessment: no apparent nausea or vomiting Anesthetic complications: no     Last Vitals:  Vitals:   05/30/17 1652 05/30/17 1705  BP: (!) 181/93 (!) 194/94  Pulse: 62 61  Resp: (!) 23 18  Temp: (!) 36.1 C (!) 35.9 C  SpO2: 95% 97%    Last Pain:  Vitals:   05/30/17 1705  TempSrc: Temporal                 Precious Haws [place]

## 2017-05-30 NOTE — Anesthesia Post-op Follow-up Note (Signed)
Anesthesia QCDR form completed.        
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
## 2017-05-30 NOTE — Anesthesia Procedure Notes (Signed)
Procedure Name: Intubation Date/Time: 05/30/2017 2:00 PM Performed by: Jonna Clark, CRNA Pre-anesthesia Checklist: Patient identified, Patient being monitored, Timeout performed, Emergency Drugs available and Suction available Patient Re-evaluated:Patient Re-evaluated prior to induction Oxygen Delivery Method: Circle system utilized Preoxygenation: Pre-oxygenation with 100% oxygen Induction Type: IV induction Ventilation: Mask ventilation without difficulty Laryngoscope Size: 3 and McGraph Grade View: Grade II Tube type: Oral Tube size: 7.0 mm Number of attempts: 2 Airway Equipment and Method: Stylet Placement Confirmation: ETT inserted through vocal cords under direct vision,  positive ETCO2 and breath sounds checked- equal and bilateral Secured at: 21 cm Tube secured with: Tape Dental Injury: Teeth and Oropharynx as per pre-operative assessment  Difficulty Due To: Difficulty was anticipated and Difficult Airway- due to anterior larynx Future Recommendations: Recommend- induction with short-acting agent, and alternative techniques readily available Comments: Initail view grade III. Use of Mcgrath improved view to a I-II

## 2017-05-30 NOTE — Anesthesia Preprocedure Evaluation (Signed)
Anesthesia Evaluation  Patient identified by MRN, date of birth, ID band Patient awake    Reviewed: Allergy & Precautions, H&P , NPO status , Patient's Chart, lab work & pertinent test results, reviewed documented beta blocker date and time   History of Anesthesia Complications Negative for: history of anesthetic complications  Airway Mallampati: III  TM Distance: >3 FB Neck ROM: full    Dental  (+) Dental Advidsory Given, Teeth Intact   Pulmonary neg shortness of breath, asthma , neg sleep apnea, neg COPD, neg recent URI,           Cardiovascular Exercise Tolerance: Good hypertension, (-) angina(-) CAD, (-) Past MI, (-) Cardiac Stents and (-) CABG (-) dysrhythmias (-) Valvular Problems/Murmurs     Neuro/Psych negative neurological ROS  negative psych ROS   GI/Hepatic negative GI ROS, Neg liver ROS,   Endo/Other  diabetes, Well Controlled, Type 2, Oral Hypoglycemic Agents  Renal/GU negative Renal ROS  negative genitourinary   Musculoskeletal   Abdominal   Peds  Hematology negative hematology ROS (+)   Anesthesia Other Findings Past Medical History: No date: Alopecia No date: Anxiety No date: Asthma     Comment:  only gets inhaler if sick (with flu or cold for example) No date: Breast cancer ([place])     Comment:  right breast No date: Cancer ([place]) No date: Diabetes mellitus without complication ([place])     Comment:  type 2 No date: Family history of adverse reaction to anesthesia     Comment:  sister's tongue swelled up and could not breathe No date: Hypertension No date: Neuromuscular disorder ([facility])     Comment:  neuropathy s/p chemotherapy No date: Urinary urgency   Reproductive/Obstetrics negative OB ROS                             Anesthesia Physical Anesthesia Plan  ASA: III  Anesthesia Plan: General   Post-op Pain Management: GA combined w/ Regional for post-op pain    Induction: Intravenous  PONV Risk Score and Plan: 3 and Ondansetron and Dexamethasone  Airway Management Planned: Oral ETT  Additional Equipment:   Intra-op Plan:   Post-operative Plan: Extubation in OR  Informed Consent: I have reviewed the patients History and Physical, chart, labs and discussed the procedure including the risks, benefits and alternatives for the proposed anesthesia with the patient or authorized representative who has indicated his/her understanding and acceptance.   Dental Advisory Given  Plan Discussed with: Anesthesiologist, CRNA and Surgeon  Anesthesia Plan Comments:         Anesthesia Quick Evaluation

## 2017-05-30 NOTE — Anesthesia Procedure Notes (Signed)
Anesthesia Regional Block: Interscalene brachial plexus block   Pre-Anesthetic Checklist: ,, timeout performed, Correct Patient, Correct Site, Correct Laterality, Correct Procedure, Correct Position, site marked, Risks and benefits discussed,  Surgical consent,  Pre-op evaluation,  At surgeon's request and post-op pain management  Laterality: Left and Upper  Prep: chloraprep       Needles:  Injection technique: Single-shot  Needle Type: Stimiplex     Needle Length: 5cm  Needle Gauge: 22     Additional Needles:   Procedures:,,,, ultrasound used (permanent image in chart),,,,  Narrative:  Start time: 05/30/2017 1:16 PM End time: 05/30/2017 1:19 PM Injection made incrementally with aspirations every 5 mL.  Performed by: Personally  Anesthesiologist: Martha Clan, MD  Additional Notes: Functioning IV was confirmed and monitors were applied.  A 26mm 22ga Stimuplex needle was used. Sterile prep and drape,hand hygiene and sterile gloves were used.  Negative aspiration and negative test dose prior to incremental administration of local anesthetic. The patient tolerated the procedure well.

## 2017-05-30 NOTE — Progress Notes (Signed)
Labetalol 10mg  given for blood pressure 209/84

## 2017-05-30 NOTE — Discharge Instructions (Addendum)
Keep dressing dry and intact.  May shower after dressing changed on post-op day #4 (Saturday).  Cover staples with Band-Aids after drying off. Apply ice frequently to shoulder. Take ibuprofen 600-800 mg THREE times per day with meals for 7-10 days, then as necessary. Take oxycodone as prescribed when needed.  May supplement with ES Tylenol if necessary. Keep shoulder immobilizer on at all times except may remove for bathing purposes. Follow-up in 10-14 days or as scheduled.  AMBULATORY SURGERY  DISCHARGE INSTRUCTIONS   1) The drugs that you were given will stay in your system until tomorrow so for the next 24 hours you should not:  A) Drive an automobile B) Make any legal decisions C) Drink any alcoholic beverage   2) You may resume regular meals tomorrow.  Today it is better to start with liquids and gradually work up to solid foods.  You may eat anything you prefer, but it is better to start with liquids, then soup and crackers, and gradually work up to solid foods.   3) Please notify your doctor immediately if you have any unusual bleeding, trouble breathing, redness and pain at the surgery site, drainage, fever, or pain not relieved by medication.  4) Additional Instructions:   Please contact your physician with any problems or Same Day Surgery at [phone], Monday through Friday 6 am to 4 pm, or [place] at [facility] number at [phone].

## 2017-05-30 NOTE — Progress Notes (Signed)
Last BP reading is 190/90.  Dr. Amie Critchley is ok with this reading

## 2017-05-31 ENCOUNTER — Encounter: Payer: Self-pay | Admitting: Surgery

## 2017-06-05 DIAGNOSIS — M6281 Muscle weakness (generalized): Secondary | ICD-10-CM | POA: Diagnosis not present

## 2017-06-05 DIAGNOSIS — G8929 Other chronic pain: Secondary | ICD-10-CM | POA: Diagnosis not present

## 2017-06-05 DIAGNOSIS — Z9889 Other specified postprocedural states: Secondary | ICD-10-CM | POA: Diagnosis not present

## 2017-06-05 DIAGNOSIS — M25612 Stiffness of left shoulder, not elsewhere classified: Secondary | ICD-10-CM | POA: Diagnosis not present

## 2017-06-05 DIAGNOSIS — M25512 Pain in left shoulder: Secondary | ICD-10-CM | POA: Diagnosis not present

## 2017-06-08 ENCOUNTER — Ambulatory Visit: Payer: Medicare Other | Admitting: Hematology and Oncology

## 2017-06-08 NOTE — Assessment & Plan Note (Deleted)
Right breast invasive ductal carcinoma 3.6 cm by MRI with enlarged right axillary lymph node biopsy proven to be positive for cancer , T2 N1 M0 stage 2B clinical stage.ER positive PR positive HER-2 negative Ki-67 22% Right lumpectomy 01/26/2015: IDC, positive for LVI, 5/12 lymph nodes positive with extracapsular extension, grade 1, multifocal 4 cm, 0.5 cm size E 100%, PR 97%, HER-2 negative, Ki-67 22%, T2 N2 stage IIIa Adjuvant radiation therapy 03/23/2015 to 05/06/2015  Current treatment: Anastrozole 1 mg daily started 05/19/2015 Anastrozole toxicities: tolerating it extremely well without any side effects.  Hypertension: Patient's primary care physician changed her medications which she is yet to pick up at the pharmacy.  Breast Cancer Surveillance: 1. Breast exam 06/08/2017: Normal 2. Mammogram 11/24/2015 No abnormalities.   Difficulty with range of motion of right arm: got physical therapy.   Return to clinic in 1 yr for follow up.

## 2017-06-19 DIAGNOSIS — Z9889 Other specified postprocedural states: Secondary | ICD-10-CM | POA: Diagnosis not present

## 2017-06-19 DIAGNOSIS — M25612 Stiffness of left shoulder, not elsewhere classified: Secondary | ICD-10-CM | POA: Diagnosis not present

## 2017-06-19 DIAGNOSIS — G8929 Other chronic pain: Secondary | ICD-10-CM | POA: Diagnosis not present

## 2017-06-19 DIAGNOSIS — M25512 Pain in left shoulder: Secondary | ICD-10-CM | POA: Diagnosis not present

## 2017-06-19 DIAGNOSIS — M6281 Muscle weakness (generalized): Secondary | ICD-10-CM | POA: Diagnosis not present

## 2017-06-28 DIAGNOSIS — Z9889 Other specified postprocedural states: Secondary | ICD-10-CM | POA: Diagnosis not present

## 2017-06-28 DIAGNOSIS — M25512 Pain in left shoulder: Secondary | ICD-10-CM | POA: Diagnosis not present

## 2017-07-03 DIAGNOSIS — G8929 Other chronic pain: Secondary | ICD-10-CM | POA: Diagnosis not present

## 2017-07-03 DIAGNOSIS — M25612 Stiffness of left shoulder, not elsewhere classified: Secondary | ICD-10-CM | POA: Diagnosis not present

## 2017-07-03 DIAGNOSIS — M25512 Pain in left shoulder: Secondary | ICD-10-CM | POA: Diagnosis not present

## 2017-07-03 DIAGNOSIS — M6281 Muscle weakness (generalized): Secondary | ICD-10-CM | POA: Diagnosis not present

## 2017-07-03 DIAGNOSIS — Z9889 Other specified postprocedural states: Secondary | ICD-10-CM | POA: Diagnosis not present

## 2017-07-07 DIAGNOSIS — Z9889 Other specified postprocedural states: Secondary | ICD-10-CM | POA: Diagnosis not present

## 2017-07-07 DIAGNOSIS — M25612 Stiffness of left shoulder, not elsewhere classified: Secondary | ICD-10-CM | POA: Diagnosis not present

## 2017-07-07 DIAGNOSIS — M25512 Pain in left shoulder: Secondary | ICD-10-CM | POA: Diagnosis not present

## 2017-07-07 DIAGNOSIS — M6281 Muscle weakness (generalized): Secondary | ICD-10-CM | POA: Diagnosis not present

## 2017-07-07 DIAGNOSIS — G8929 Other chronic pain: Secondary | ICD-10-CM | POA: Diagnosis not present

## 2017-07-12 DIAGNOSIS — G8929 Other chronic pain: Secondary | ICD-10-CM | POA: Diagnosis not present

## 2017-07-12 DIAGNOSIS — M6281 Muscle weakness (generalized): Secondary | ICD-10-CM | POA: Diagnosis not present

## 2017-07-12 DIAGNOSIS — M25512 Pain in left shoulder: Secondary | ICD-10-CM | POA: Diagnosis not present

## 2017-07-12 DIAGNOSIS — Z9889 Other specified postprocedural states: Secondary | ICD-10-CM | POA: Diagnosis not present

## 2017-07-12 DIAGNOSIS — M25612 Stiffness of left shoulder, not elsewhere classified: Secondary | ICD-10-CM | POA: Diagnosis not present

## 2017-07-14 DIAGNOSIS — Z9889 Other specified postprocedural states: Secondary | ICD-10-CM | POA: Diagnosis not present

## 2017-07-14 DIAGNOSIS — M25512 Pain in left shoulder: Secondary | ICD-10-CM | POA: Diagnosis not present

## 2017-07-17 DIAGNOSIS — G8929 Other chronic pain: Secondary | ICD-10-CM | POA: Diagnosis not present

## 2017-07-17 DIAGNOSIS — Z9889 Other specified postprocedural states: Secondary | ICD-10-CM | POA: Diagnosis not present

## 2017-07-17 DIAGNOSIS — M25512 Pain in left shoulder: Secondary | ICD-10-CM | POA: Diagnosis not present

## 2017-07-17 DIAGNOSIS — M25612 Stiffness of left shoulder, not elsewhere classified: Secondary | ICD-10-CM | POA: Diagnosis not present

## 2017-07-17 DIAGNOSIS — M6281 Muscle weakness (generalized): Secondary | ICD-10-CM | POA: Diagnosis not present

## 2017-07-19 DIAGNOSIS — Z9889 Other specified postprocedural states: Secondary | ICD-10-CM | POA: Diagnosis not present

## 2017-07-19 DIAGNOSIS — M25512 Pain in left shoulder: Secondary | ICD-10-CM | POA: Diagnosis not present

## 2017-07-19 DIAGNOSIS — G8929 Other chronic pain: Secondary | ICD-10-CM | POA: Diagnosis not present

## 2017-07-19 DIAGNOSIS — M25612 Stiffness of left shoulder, not elsewhere classified: Secondary | ICD-10-CM | POA: Diagnosis not present

## 2017-07-19 DIAGNOSIS — M6281 Muscle weakness (generalized): Secondary | ICD-10-CM | POA: Diagnosis not present

## 2017-07-28 DIAGNOSIS — Z9889 Other specified postprocedural states: Secondary | ICD-10-CM | POA: Diagnosis not present

## 2017-07-28 DIAGNOSIS — M6281 Muscle weakness (generalized): Secondary | ICD-10-CM | POA: Diagnosis not present

## 2017-07-28 DIAGNOSIS — M25512 Pain in left shoulder: Secondary | ICD-10-CM | POA: Diagnosis not present

## 2017-07-28 DIAGNOSIS — G8929 Other chronic pain: Secondary | ICD-10-CM | POA: Diagnosis not present

## 2017-08-04 DIAGNOSIS — Z9889 Other specified postprocedural states: Secondary | ICD-10-CM | POA: Diagnosis not present

## 2017-08-04 DIAGNOSIS — M6281 Muscle weakness (generalized): Secondary | ICD-10-CM | POA: Diagnosis not present

## 2017-08-07 DIAGNOSIS — Z9889 Other specified postprocedural states: Secondary | ICD-10-CM | POA: Diagnosis not present

## 2017-08-07 DIAGNOSIS — M25612 Stiffness of left shoulder, not elsewhere classified: Secondary | ICD-10-CM | POA: Diagnosis not present

## 2017-08-07 DIAGNOSIS — M25512 Pain in left shoulder: Secondary | ICD-10-CM | POA: Diagnosis not present

## 2017-08-07 DIAGNOSIS — M6281 Muscle weakness (generalized): Secondary | ICD-10-CM | POA: Diagnosis not present

## 2017-08-07 DIAGNOSIS — G8929 Other chronic pain: Secondary | ICD-10-CM | POA: Diagnosis not present

## 2017-09-13 ENCOUNTER — Other Ambulatory Visit: Payer: Self-pay | Admitting: Hematology and Oncology

## 2017-09-13 DIAGNOSIS — Z170 Estrogen receptor positive status [ER+]: Principal | ICD-10-CM

## 2017-09-13 DIAGNOSIS — C50411 Malignant neoplasm of upper-outer quadrant of right female breast: Secondary | ICD-10-CM

## 2017-09-20 DIAGNOSIS — E1165 Type 2 diabetes mellitus with hyperglycemia: Secondary | ICD-10-CM | POA: Diagnosis not present

## 2017-09-20 DIAGNOSIS — I10 Essential (primary) hypertension: Secondary | ICD-10-CM | POA: Diagnosis not present

## 2017-09-20 DIAGNOSIS — E7800 Pure hypercholesterolemia, unspecified: Secondary | ICD-10-CM | POA: Diagnosis not present

## 2017-09-20 DIAGNOSIS — G4700 Insomnia, unspecified: Secondary | ICD-10-CM | POA: Diagnosis not present

## 2017-09-20 DIAGNOSIS — Z6834 Body mass index (BMI) 34.0-34.9, adult: Secondary | ICD-10-CM | POA: Diagnosis not present

## 2017-09-20 DIAGNOSIS — Z1331 Encounter for screening for depression: Secondary | ICD-10-CM | POA: Diagnosis not present

## 2017-09-27 ENCOUNTER — Other Ambulatory Visit: Payer: Self-pay

## 2017-09-27 DIAGNOSIS — Z170 Estrogen receptor positive status [ER+]: Principal | ICD-10-CM

## 2017-09-27 DIAGNOSIS — C50411 Malignant neoplasm of upper-outer quadrant of right female breast: Secondary | ICD-10-CM

## 2017-09-27 MED ORDER — ANASTROZOLE 1 MG PO TABS: 1.0000 mg | ORAL_TABLET | Freq: Every day | ORAL | 0 refills | Status: DC

## 2017-09-27 NOTE — Telephone Encounter (Signed)
Pt called to request refill on her anastrozole. She has been out for 3 weeks now. Pt has not seen MD for 1 yr follow up. Will refill medication for 30 days and pt will need to see MD for her annual follow up soon. Pt wants to set up appt for first available. Next appt set for 10/03/17. Pt confirmed time/date. No further questions or concerns at this time.   Gave pt phone number to call [facility] to schedule her annual mammogram.

## 2017-10-03 ENCOUNTER — Telehealth: Payer: Self-pay | Admitting: Hematology and Oncology

## 2017-10-03 ENCOUNTER — Ambulatory Visit: Payer: Medicare Other | Admitting: Hematology and Oncology

## 2017-10-03 NOTE — Telephone Encounter (Signed)
Per 4/2 sch message - left message for patient to call back to schedule appt -

## 2017-10-03 NOTE — Progress Notes (Deleted)
Patient Care Team: Cyndi Bender, PA-C as PCP - General (Physician Assistant) Nicholas Lose, MD as Consulting Physician (Hematology and Oncology) Fanny Skates, MD as Consulting Physician (General Surgery) Kyung Rudd, MD as Consulting Physician (Radiation Oncology) Sylvan Cheese, NP as Nurse Practitioner (Hematology and Oncology)  DIAGNOSIS:  Encounter Diagnosis  Name Primary?  . Malignant neoplasm of upper-outer quadrant of right breast in female, estrogen receptor positive ([place])     SUMMARY OF ONCOLOGIC HISTORY:   Breast cancer of upper-outer quadrant of right female breast ([place])   05/27/2014 Mammogram    Right breast mass by mammogram 5.4 cm by ultrasound 4 x 4 X 2 cm      05/28/2014 Initial Biopsy    Right breast biopsy 11:30: Invasive ductal carcinoma with lymphovascular invasion grade 1/2, ER 100%, PR 97%, Ki-67 22%, HER-2 negative ratio 1.26 average copy #1.7, lymph node biopsy also positive      06/16/2014 Breast MRI    right breast cancer: 3.6 cm at 12:00 position mildly enlarged right axillary lymph node: 5 mm oval enhancing mass in the central left breast could be an intramammary lymph node      06/16/2014 Clinical Stage    Stage IIB (T2 N1)        07/10/2014 - 11/11/2014 Neo-Adjuvant Chemotherapy    Dose dense Adriamycin and Cytoxan 4 followed by Abraxane 12      07/10/2014 Procedure    PREVENT clinical trial (Lipitor vs Placebo) in patients undergoing cardiotoxic chemotherapy      12/08/2014 Breast MRI    Mass appears smaller, now measuring 2.3 x 4.9 x 2.7 cm. Previously using the same dimensions mass measured 3.4 x 4.6 x 2.7 cm. Improvement of axill LN      01/26/2015 Definitive Surgery    Right lumpectomy: IDC, positive for LVI, 5/12 lymph nodes positive with extracapsular extension, grade 1, multifocal 4 cm, 0.5 cm size E 100%, PR 97%, HER-2 negative, Ki-67 22%      01/26/2015 Pathologic Stage    Stage IIIA: T2 N2      03/23/2015 -  05/06/2015 Radiation Therapy    Adjuvant breast radiation [facility]): Right breast 50.4 Gy over 28 fractions; right supraclavicular 50.4 Gy; right breast boost: 10 Gy over 5 fractions.      05/19/2015 -  Anti-estrogen oral therapy    Anastrozole 1 mg daily      06/19/2015 Survivorship    Survivorship care plan given to patient       CHIEF COMPLIANT: Follow-up on anastrozole therapy  INTERVAL HISTORY: Marissa Haney is a 63 year old with above-mentioned history of right breast cancer treated with lumpectomy followed by adjuvant radiation and is currently on anastrozole therapy.  She appears to be tolerating anastrozole extremely well.  She does not have any significant hot flashes or arthralgias or myalgias.  She denies any lumps or nodules in the breast.  She has not had a mammogram in 2018.  REVIEW OF SYSTEMS:   Constitutional: Denies fevers, chills or abnormal weight loss Eyes: Denies blurriness of vision Ears, nose, mouth, throat, and face: Denies mucositis or sore throat Respiratory: Denies cough, dyspnea or wheezes Cardiovascular: Denies palpitation, chest discomfort Gastrointestinal:  Denies nausea, heartburn or change in bowel habits Skin: Denies abnormal skin rashes Lymphatics: Denies new lymphadenopathy or easy bruising Neurological:Denies numbness, tingling or new weaknesses Behavioral/Psych: Mood is stable, no new changes  Extremities: No lower extremity edema Breast:  denies any pain or lumps or nodules in either breasts All other  systems were reviewed with the patient and are negative.  I have reviewed the past medical history, past surgical history, social history and family history with the patient and they are unchanged from previous note.  ALLERGIES:  has No Known Allergies.  MEDICATIONS:  Current Outpatient Medications  Medication Sig Dispense Refill  . anastrozole (ARIMIDEX) 1 MG tablet Take 1 tablet (1 mg total) by mouth daily. 30 tablet 0  . cyclobenzaprine  (FLEXERIL) 10 MG tablet Take 10 mg at bedtime as needed by mouth for muscle spasms or sleep.  2  . lisinopril-hydrochlorothiazide (PRINZIDE,ZESTORETIC) 20-12.5 MG tablet Take 1 tablet 2 (two) times daily by mouth.  5  . metFORMIN (GLUCOPHAGE-XR) 500 MG 24 hr tablet Take 500 mg daily by mouth.  5  . oxyCODONE (ROXICODONE) 5 MG immediate release tablet Take 1-2 tablets (5-10 mg total) by mouth every 4 (four) hours as needed. 50 tablet 0   No current facility-administered medications for this visit.     PHYSICAL EXAMINATION: ECOG PERFORMANCE STATUS: 1 - Symptomatic but completely ambulatory  There were no vitals filed for this visit. There were no vitals filed for this visit.  GENERAL:alert, no distress and comfortable SKIN: skin color, texture, turgor are normal, no rashes or significant lesions EYES: normal, Conjunctiva are pink and non-injected, sclera clear OROPHARYNX:no exudate, no erythema and lips, buccal mucosa, and tongue normal  NECK: supple, thyroid normal size, non-tender, without nodularity LYMPH:  no palpable lymphadenopathy in the cervical, axillary or inguinal LUNGS: clear to auscultation and percussion with normal breathing effort HEART: regular rate & rhythm and no murmurs and no lower extremity edema ABDOMEN:abdomen soft, non-tender and normal bowel sounds MUSCULOSKELETAL:no cyanosis of digits and no clubbing  NEURO: alert & oriented x 3 with fluent speech, no focal motor/sensory deficits EXTREMITIES: No lower extremity edema BREAST: No palpable masses or nodules in either right or left breasts. No palpable axillary supraclavicular or infraclavicular adenopathy no breast tenderness or nipple discharge. (exam performed in the presence of a chaperone)  LABORATORY DATA:  I have reviewed the data as listed CMP Latest Ref Rng & Units 05/22/2017 01/26/2015 01/20/2015  Glucose 65 - 99 mg/dL 269(H) - 255(H)  BUN 6 - 20 mg/dL 15 - 9  Creatinine 0.44 - 1.00 mg/dL 0.90 0.82  1.10(H)  Sodium 135 - 145 mmol/L 138 - 139  Potassium 3.5 - 5.1 mmol/L 3.4(L) - 3.3(L)  Chloride 101 - 111 mmol/L 102 - 105  CO2 22 - 32 mmol/L 28 - 26  Calcium 8.9 - 10.3 mg/dL 8.7(L) - 8.7(L)  Total Protein 6.5 - 8.1 g/dL - - 7.5  Total Bilirubin 0.3 - 1.2 mg/dL - - 1.1  Alkaline Phos 38 - 126 U/L - - 122  AST 15 - 41 U/L - - 21  ALT 14 - 54 U/L - - 17    Lab Results  Component Value Date   WBC 13.4 (H) 01/26/2015   HGB 13.2 01/26/2015   HCT 39.6 01/26/2015   MCV 94.5 01/26/2015   PLT 189 01/26/2015   NEUTROABS 4.9 01/20/2015    ASSESSMENT & PLAN:  Breast cancer of upper-outer quadrant of right female breast ([facility]) Right breast invasive ductal carcinoma 3.6 cm by MRI with enlarged right axillary lymph node biopsy proven to be positive for cancer , T2 N1 M0 stage 2B clinical stage.ER positive PR positive HER-2 negative Ki-67 22% Right lumpectomy07/25/2016: IDC, positive for LVI, 5/12 lymph nodes positive with extracapsular extension, grade 1, multifocal 4 cm, 0.5 cm  size E 100%, PR 97%, HER-2 negative, Ki-67 22%, T2 N2 stage IIIa Adjuvant radiation therapy 03/23/2015 to 05/06/2015  Current treatment: Anastrozole 1 mg daily started 05/19/2015 Anastrozole toxicities: tolerating it extremely well without any side effects.  Hypertension: Doing much better   Breast Cancer Surveillance: 1. Breast exam  10/03/2017: No palpable lumps or nodules of concern 2. Mammogram 11/24/2015 No abnormalities. Postsurgical changes. Breast Density Category B. Patient will need new mammograms 3. Bone density 05/07/2015: T score -0.7: Normal Patient will need a bone density test every 2 years.  Difficulty with range of motion of right arm: got physical therapy.   Return to clinic in 1 yr for follow up.      No orders of the defined types were placed in this encounter.  The patient has a good understanding of the overall plan. she agrees with it. she will call with any problems that may  develop before the next visit here.   Harriette Ohara, MD 10/03/17

## 2017-10-03 NOTE — Assessment & Plan Note (Deleted)
Right breast invasive ductal carcinoma 3.6 cm by MRI with enlarged right axillary lymph node biopsy proven to be positive for cancer , T2 N1 M0 stage 2B clinical stage.ER positive PR positive HER-2 negative Ki-67 22% Right lumpectomy07/25/2016: IDC, positive for LVI, 5/12 lymph nodes positive with extracapsular extension, grade 1, multifocal 4 cm, 0.5 cm size E 100%, PR 97%, HER-2 negative, Ki-67 22%, T2 N2 stage IIIa Adjuvant radiation therapy 03/23/2015 to 05/06/2015  Current treatment: Anastrozole 1 mg daily started 05/19/2015 Anastrozole toxicities: tolerating it extremely well without any side effects.  Hypertension: Doing much better   Breast Cancer Surveillance: 1. Breast exam  10/03/2017: No palpable lumps or nodules of concern 2. Mammogram 11/24/2015 No abnormalities. Postsurgical changes. Breast Density Category B. Patient will need new mammograms 3. Bone density 05/07/2015: T score -0.7: Normal Patient will need a bone density test every 2 years.  Difficulty with range of motion of right arm: got physical therapy.   Return to clinic in 1 yr for follow up.

## 2017-10-09 ENCOUNTER — Ambulatory Visit
Admission: RE | Admit: 2017-10-09 | Discharge: 2017-10-09 | Disposition: A | Discharge status: Home / Self Care | Payer: Medicare Other | Source: Ambulatory Visit | Attending: Hematology and Oncology | Admitting: Hematology and Oncology

## 2017-10-09 ENCOUNTER — Ambulatory Visit: Payer: Medicare Other

## 2017-10-09 DIAGNOSIS — C50411 Malignant neoplasm of upper-outer quadrant of right female breast: Secondary | ICD-10-CM

## 2017-10-09 DIAGNOSIS — R928 Other abnormal and inconclusive findings on diagnostic imaging of breast: Secondary | ICD-10-CM | POA: Diagnosis not present

## 2017-10-09 DIAGNOSIS — Z170 Estrogen receptor positive status [ER+]: Principal | ICD-10-CM

## 2017-11-02 ENCOUNTER — Inpatient Hospital Stay: Payer: Medicare Other | Admitting: Hematology and Oncology

## 2017-11-02 ENCOUNTER — Telehealth: Payer: Self-pay | Admitting: Hematology and Oncology

## 2017-11-02 NOTE — Progress Notes (Deleted)
Patient Care Team: Cyndi Bender, PA-C as PCP - General (Physician Assistant) Nicholas Lose, MD as Consulting Physician (Hematology and Oncology) Fanny Skates, MD as Consulting Physician (General Surgery) Kyung Rudd, MD as Consulting Physician (Radiation Oncology) Sylvan Cheese, NP as Nurse Practitioner (Hematology and Oncology)  DIAGNOSIS:  Encounter Diagnosis  Name Primary?  . Malignant neoplasm of upper-outer quadrant of right breast in female, estrogen receptor positive ([place])     SUMMARY OF ONCOLOGIC HISTORY:   Breast cancer of upper-outer quadrant of right female breast ([place])   05/27/2014 Mammogram    Right breast mass by mammogram 5.4 cm by ultrasound 4 x 4 X 2 cm      05/28/2014 Initial Biopsy    Right breast biopsy 11:30: Invasive ductal carcinoma with lymphovascular invasion grade 1/2, ER 100%, PR 97%, Ki-67 22%, HER-2 negative ratio 1.26 average copy #1.7, lymph node biopsy also positive      06/16/2014 Breast MRI    right breast cancer: 3.6 cm at 12:00 position mildly enlarged right axillary lymph node: 5 mm oval enhancing mass in the central left breast could be an intramammary lymph node      06/16/2014 Clinical Stage    Stage IIB (T2 N1)        07/10/2014 - 11/11/2014 Neo-Adjuvant Chemotherapy    Dose dense Adriamycin and Cytoxan 4 followed by Abraxane 12      07/10/2014 Procedure    PREVENT clinical trial (Lipitor vs Placebo) in patients undergoing cardiotoxic chemotherapy      12/08/2014 Breast MRI    Mass appears smaller, now measuring 2.3 x 4.9 x 2.7 cm. Previously using the same dimensions mass measured 3.4 x 4.6 x 2.7 cm. Improvement of axill LN      01/26/2015 Definitive Surgery    Right lumpectomy: IDC, positive for LVI, 5/12 lymph nodes positive with extracapsular extension, grade 1, multifocal 4 cm, 0.5 cm size E 100%, PR 97%, HER-2 negative, Ki-67 22%      01/26/2015 Pathologic Stage    Stage IIIA: T2 N2      03/23/2015 -  05/06/2015 Radiation Therapy    Adjuvant breast radiation [facility]): Right breast 50.4 Gy over 28 fractions; right supraclavicular 50.4 Gy; right breast boost: 10 Gy over 5 fractions.      05/19/2015 -  Anti-estrogen oral therapy    Anastrozole 1 mg daily      06/19/2015 Survivorship    Survivorship care plan given to patient       CHIEF COMPLIANT: Follow-up on anastrozole therapy  INTERVAL HISTORY: Marissa Haney is a 62 year old with above-mentioned history of right breast cancer treated with lumpectomy after undergoing neoadjuvant chemotherapy.  She then underwent radiation and is currently on antiestrogen therapy with anastrozole.  She appears to be tolerating it extremely well.  She denies any hot flashes or myalgias.  Denies any lumps or nodules in the breast.  REVIEW OF SYSTEMS:   Constitutional: Denies fevers, chills or abnormal weight loss Eyes: Denies blurriness of vision Ears, nose, mouth, throat, and face: Denies mucositis or sore throat Respiratory: Denies cough, dyspnea or wheezes Cardiovascular: Denies palpitation, chest discomfort Gastrointestinal:  Denies nausea, heartburn or change in bowel habits Skin: Denies abnormal skin rashes Lymphatics: Denies new lymphadenopathy or easy bruising Neurological:Denies numbness, tingling or new weaknesses Behavioral/Psych: Mood is stable, no new changes  Extremities: No lower extremity edema Breast:  denies any pain or lumps or nodules in either breasts All other systems were reviewed with the patient and are  negative.  I have reviewed the past medical history, past surgical history, social history and family history with the patient and they are unchanged from previous note.  ALLERGIES:  has No Known Allergies.  MEDICATIONS:  Current Outpatient Medications  Medication Sig Dispense Refill  . anastrozole (ARIMIDEX) 1 MG tablet Take 1 tablet (1 mg total) by mouth daily. 30 tablet 0  . cyclobenzaprine (FLEXERIL) 10 MG tablet  Take 10 mg at bedtime as needed by mouth for muscle spasms or sleep.  2  . lisinopril-hydrochlorothiazide (PRINZIDE,ZESTORETIC) 20-12.5 MG tablet Take 1 tablet 2 (two) times daily by mouth.  5  . metFORMIN (GLUCOPHAGE-XR) 500 MG 24 hr tablet Take 500 mg daily by mouth.  5  . oxyCODONE (ROXICODONE) 5 MG immediate release tablet Take 1-2 tablets (5-10 mg total) by mouth every 4 (four) hours as needed. 50 tablet 0   No current facility-administered medications for this visit.     PHYSICAL EXAMINATION: ECOG PERFORMANCE STATUS: 1 - Symptomatic but completely ambulatory  There were no vitals filed for this visit. There were no vitals filed for this visit.  GENERAL:alert, no distress and comfortable SKIN: skin color, texture, turgor are normal, no rashes or significant lesions EYES: normal, Conjunctiva are pink and non-injected, sclera clear OROPHARYNX:no exudate, no erythema and lips, buccal mucosa, and tongue normal  NECK: supple, thyroid normal size, non-tender, without nodularity LYMPH:  no palpable lymphadenopathy in the cervical, axillary or inguinal LUNGS: clear to auscultation and percussion with normal breathing effort HEART: regular rate & rhythm and no murmurs and no lower extremity edema ABDOMEN:abdomen soft, non-tender and normal bowel sounds MUSCULOSKELETAL:no cyanosis of digits and no clubbing  NEURO: alert & oriented x 3 with fluent speech, no focal motor/sensory deficits EXTREMITIES: No lower extremity edema BREAST: No palpable masses or nodules in either right or left breasts. No palpable axillary supraclavicular or infraclavicular adenopathy no breast tenderness or nipple discharge. (exam performed in the presence of a chaperone)  LABORATORY DATA:  I have reviewed the data as listed CMP Latest Ref Rng & Units 05/22/2017 01/26/2015 01/20/2015  Glucose 65 - 99 mg/dL 269(H) - 255(H)  BUN 6 - 20 mg/dL 15 - 9  Creatinine 0.44 - 1.00 mg/dL 0.90 0.82 1.10(H)  Sodium 135 - 145  mmol/L 138 - 139  Potassium 3.5 - 5.1 mmol/L 3.4(L) - 3.3(L)  Chloride 101 - 111 mmol/L 102 - 105  CO2 22 - 32 mmol/L 28 - 26  Calcium 8.9 - 10.3 mg/dL 8.7(L) - 8.7(L)  Total Protein 6.5 - 8.1 g/dL - - 7.5  Total Bilirubin 0.3 - 1.2 mg/dL - - 1.1  Alkaline Phos 38 - 126 U/L - - 122  AST 15 - 41 U/L - - 21  ALT 14 - 54 U/L - - 17    Lab Results  Component Value Date   WBC 13.4 (H) 01/26/2015   HGB 13.2 01/26/2015   HCT 39.6 01/26/2015   MCV 94.5 01/26/2015   PLT 189 01/26/2015   NEUTROABS 4.9 01/20/2015    ASSESSMENT & PLAN:  Breast cancer of upper-outer quadrant of right female breast (HCC) Right breast invasive ductal carcinoma 3.6 cm by MRI with enlarged right axillary lymph node biopsy proven to be positive for cancer , T2 N1 M0 stage 2B clinical stage.ER positive PR positive HER-2 negative Ki-67 22% Right lumpectomy07/25/2016: IDC, positive for LVI, 5/12 lymph nodes positive with extracapsular extension, grade 1, multifocal 4 cm, 0.5 cm size E 100%, PR 97%, HER-2 negative, Ki-67  22%, T2 N2 stage IIIa Adjuvant radiation therapy 03/23/2015 to 05/06/2015  Current treatment: Anastrozole 1 mg daily started 05/19/2015 Anastrozole toxicities: Denies any hot flashes or myalgias  Hypertension: Doing much better Chemo-induced peripheral neuropathy: Stable  Breast Cancer Surveillance: 1. Breast exam  11/02/2017: Normal 2. Mammogram 10/09/2017 no abnormalities. Postsurgical changes. Breast Density Category B.  Difficulty with range of motion of right arm:  Improved with physical therapy.   Return to clinic in 1 yr for follow up.      No orders of the defined types were placed in this encounter.  The patient has a good understanding of the overall plan. she agrees with it. she will call with any problems that may develop before the next visit here.   Harriette Ohara, MD 11/02/17

## 2017-11-02 NOTE — Assessment & Plan Note (Deleted)
Right breast invasive ductal carcinoma 3.6 cm by MRI with enlarged right axillary lymph node biopsy proven to be positive for cancer , T2 N1 M0 stage 2B clinical stage.ER positive PR positive HER-2 negative Ki-67 22% Right lumpectomy07/25/2016: IDC, positive for LVI, 5/12 lymph nodes positive with extracapsular extension, grade 1, multifocal 4 cm, 0.5 cm size E 100%, PR 97%, HER-2 negative, Ki-67 22%, T2 N2 stage IIIa Adjuvant radiation therapy 03/23/2015 to 05/06/2015  Current treatment: Anastrozole 1 mg daily started 05/19/2015 Anastrozole toxicities: Denies any hot flashes or myalgias  Hypertension: Doing much better Chemo-induced peripheral neuropathy: Stable  Breast Cancer Surveillance: 1. Breast exam  11/02/2017: Normal 2. Mammogram 10/09/2017 no abnormalities. Postsurgical changes. Breast Density Category B.  Difficulty with range of motion of right arm:  Improved with physical therapy.   Return to clinic in 1 yr for follow up.   

## 2017-11-02 NOTE — Telephone Encounter (Signed)
Patient needed to reschedule she forgot her appointment

## 2017-11-03 ENCOUNTER — Other Ambulatory Visit: Payer: Self-pay | Admitting: Hematology and Oncology

## 2017-11-03 DIAGNOSIS — Z170 Estrogen receptor positive status [ER+]: Principal | ICD-10-CM

## 2017-11-03 DIAGNOSIS — C50411 Malignant neoplasm of upper-outer quadrant of right female breast: Secondary | ICD-10-CM

## 2017-11-09 ENCOUNTER — Inpatient Hospital Stay: Payer: Medicare Other | Attending: Hematology and Oncology | Admitting: Hematology and Oncology

## 2017-11-09 ENCOUNTER — Telehealth: Payer: Self-pay | Admitting: Hematology and Oncology

## 2017-11-09 ENCOUNTER — Encounter (HOSPITAL_COMMUNITY): Payer: Medicare Other

## 2017-11-09 ENCOUNTER — Other Ambulatory Visit: Payer: Self-pay

## 2017-11-09 ENCOUNTER — Telehealth: Payer: Self-pay

## 2017-11-09 VITALS — BP 222/88 | HR 57 | Temp 98.1°F | Resp 17 | Ht 67.0 in | Wt 226.4 lb

## 2017-11-09 DIAGNOSIS — Z170 Estrogen receptor positive status [ER+]: Secondary | ICD-10-CM | POA: Diagnosis not present

## 2017-11-09 DIAGNOSIS — I10 Essential (primary) hypertension: Secondary | ICD-10-CM | POA: Insufficient documentation

## 2017-11-09 DIAGNOSIS — M25421 Effusion, right elbow: Secondary | ICD-10-CM | POA: Diagnosis not present

## 2017-11-09 DIAGNOSIS — C50411 Malignant neoplasm of upper-outer quadrant of right female breast: Secondary | ICD-10-CM | POA: Insufficient documentation

## 2017-11-09 DIAGNOSIS — M7989 Other specified soft tissue disorders: Secondary | ICD-10-CM

## 2017-11-09 DIAGNOSIS — Z79811 Long term (current) use of aromatase inhibitors: Secondary | ICD-10-CM | POA: Insufficient documentation

## 2017-11-09 MED ORDER — ANASTROZOLE 1 MG PO TABS: 1.0000 mg | ORAL_TABLET | Freq: Every day | ORAL | 3 refills | Status: DC

## 2017-11-09 NOTE — Telephone Encounter (Signed)
Per Dr Lindi Adie, order placed for right arm u/s to rule out clot for today or tomorrow. Called [facility] vascular lab to arrange appt, they had an appt today or tomorrow.  Called pt and she prefers tomorrow at 8:45 am at [facility], scheduled with vasular lab, pt voiced understanding.

## 2017-11-09 NOTE — Progress Notes (Signed)
Patient Care Team: Cyndi Bender, PA-C as PCP - General (Physician Assistant) Nicholas Lose, MD as Consulting Physician (Hematology and Oncology) Fanny Skates, MD as Consulting Physician (General Surgery) Kyung Rudd, MD as Consulting Physician (Radiation Oncology) Sylvan Cheese, NP as Nurse Practitioner (Hematology and Oncology)  DIAGNOSIS:  Encounter Diagnoses  Name Primary?  . Malignant neoplasm of upper-outer quadrant of right breast in female, estrogen receptor positive ([place])   . Swelling of joint of upper arm, right Yes    SUMMARY OF ONCOLOGIC HISTORY:   Breast cancer of upper-outer quadrant of right female breast ([place])   05/27/2014 Mammogram    Right breast mass by mammogram 5.4 cm by ultrasound 4 x 4 X 2 cm      05/28/2014 Initial Biopsy    Right breast biopsy 11:30: Invasive ductal carcinoma with lymphovascular invasion grade 1/2, ER 100%, PR 97%, Ki-67 22%, HER-2 negative ratio 1.26 average copy #1.7, lymph node biopsy also positive      06/16/2014 Breast MRI    right breast cancer: 3.6 cm at 12:00 position mildly enlarged right axillary lymph node: 5 mm oval enhancing mass in the central left breast could be an intramammary lymph node      06/16/2014 Clinical Stage    Stage IIB (T2 N1)        07/10/2014 - 11/11/2014 Neo-Adjuvant Chemotherapy    Dose dense Adriamycin and Cytoxan 4 followed by Abraxane 12      07/10/2014 Procedure    PREVENT clinical trial (Lipitor vs Placebo) in patients undergoing cardiotoxic chemotherapy      12/08/2014 Breast MRI    Mass appears smaller, now measuring 2.3 x 4.9 x 2.7 cm. Previously using the same dimensions mass measured 3.4 x 4.6 x 2.7 cm. Improvement of axill LN      01/26/2015 Definitive Surgery    Right lumpectomy: IDC, positive for LVI, 5/12 lymph nodes positive with extracapsular extension, grade 1, multifocal 4 cm, 0.5 cm size E 100%, PR 97%, HER-2 negative, Ki-67 22%      01/26/2015 Pathologic Stage    Stage IIIA: T2 N2      03/23/2015 - 05/06/2015 Radiation Therapy    Adjuvant breast radiation [facility]): Right breast 50.4 Gy over 28 fractions; right supraclavicular 50.4 Gy; right breast boost: 10 Gy over 5 fractions.      05/19/2015 -  Anti-estrogen oral therapy    Anastrozole 1 mg daily      06/19/2015 Survivorship    Survivorship care plan given to patient       CHIEF COMPLIANT: Follow-up on anastrozole therapy  INTERVAL HISTORY: Marissa Haney is a 62 year old with above-mentioned history of right breast cancer underwent lumpectomy followed by adjuvant radiation is currently on anastrozole.  She appears to be tolerating anastrozole extremely well.  She does not have any hot flashes or myalgias.  She had left shoulder surgery November 2018.  She is doing much better with regards to the pain and discomfort in the shoulder.  Over the past 2 days she has noticed increased swelling of the right arm  REVIEW OF SYSTEMS:   Constitutional: Denies fevers, chills or abnormal weight loss Eyes: Denies blurriness of vision Ears, nose, mouth, throat, and face: Denies mucositis or sore throat Respiratory: Denies cough, dyspnea or wheezes Cardiovascular: Denies palpitation, chest discomfort Gastrointestinal:  Denies nausea, heartburn or change in bowel habits Skin: Denies abnormal skin rashes Lymphatics: Denies new lymphadenopathy or easy bruising Neurological:Denies numbness, tingling or new weaknesses Behavioral/Psych: Mood is stable,  no new changes  Extremities: Right arm markedly swollen  All other systems were reviewed with the patient and are negative.  I have reviewed the past medical history, past surgical history, social history and family history with the patient and they are unchanged from previous note.  ALLERGIES:  has No Known Allergies.  MEDICATIONS:  Current Outpatient Medications  Medication Sig Dispense Refill  . anastrozole (ARIMIDEX) 1 MG tablet TAKE 1 TABLET BY  MOUTH EVERY DAY 30 tablet 0  . cyclobenzaprine (FLEXERIL) 10 MG tablet Take 10 mg at bedtime as needed by mouth for muscle spasms or sleep.  2  . lisinopril-hydrochlorothiazide (PRINZIDE,ZESTORETIC) 20-12.5 MG tablet Take 1 tablet 2 (two) times daily by mouth.  5  . metFORMIN (GLUCOPHAGE-XR) 500 MG 24 hr tablet Take 500 mg daily by mouth.  5  . oxyCODONE (ROXICODONE) 5 MG immediate release tablet Take 1-2 tablets (5-10 mg total) by mouth every 4 (four) hours as needed. 50 tablet 0   No current facility-administered medications for this visit.     PHYSICAL EXAMINATION: ECOG PERFORMANCE STATUS: 1 - Symptomatic but completely ambulatory  Vitals:   11/09/17 0817  BP: (!) 222/88  Pulse: (!) 57  Resp: 17  Temp: 98.1 F (36.7 C)  SpO2: 100%   Filed Weights   11/09/17 0817  Weight: 226 lb 6.4 oz (102.7 kg)    GENERAL:alert, no distress and comfortable SKIN: skin color, texture, turgor are normal, no rashes or significant lesions EYES: normal, Conjunctiva are pink and non-injected, sclera clear OROPHARYNX:no exudate, no erythema and lips, buccal mucosa, and tongue normal  NECK: supple, thyroid normal size, non-tender, without nodularity LYMPH:  no palpable lymphadenopathy in the cervical, axillary or inguinal LUNGS: clear to auscultation and percussion with normal breathing effort HEART: regular rate & rhythm and no murmurs and no lower extremity edema ABDOMEN:abdomen soft, non-tender and normal bowel sounds MUSCULOSKELETAL:no cyanosis of digits and no clubbing  NEURO: alert & oriented x 3 with fluent speech, no focal motor/sensory deficits EXTREMITIES: Right arm swelling  LABORATORY DATA:  I have reviewed the data as listed CMP Latest Ref Rng & Units 05/22/2017 01/26/2015 01/20/2015  Glucose 65 - 99 mg/dL 269(H) - 255(H)  BUN 6 - 20 mg/dL 15 - 9  Creatinine 0.44 - 1.00 mg/dL 0.90 0.82 1.10(H)  Sodium 135 - 145 mmol/L 138 - 139  Potassium 3.5 - 5.1 mmol/L 3.4(L) - 3.3(L)  Chloride  101 - 111 mmol/L 102 - 105  CO2 22 - 32 mmol/L 28 - 26  Calcium 8.9 - 10.3 mg/dL 8.7(L) - 8.7(L)  Total Protein 6.5 - 8.1 g/dL - - 7.5  Total Bilirubin 0.3 - 1.2 mg/dL - - 1.1  Alkaline Phos 38 - 126 U/L - - 122  AST 15 - 41 U/L - - 21  ALT 14 - 54 U/L - - 17    Lab Results  Component Value Date   WBC 13.4 (H) 01/26/2015   HGB 13.2 01/26/2015   HCT 39.6 01/26/2015   MCV 94.5 01/26/2015   PLT 189 01/26/2015   NEUTROABS 4.9 01/20/2015    ASSESSMENT & PLAN:  Breast cancer of upper-outer quadrant of right female breast (HCC) Right breast invasive ductal carcinoma 3.6 cm by MRI with enlarged right axillary lymph node biopsy proven to be positive for cancer , T2 N1 M0 stage 2B clinical stage.ER positive PR positive HER-2 negative Ki-67 22% Right lumpectomy07/25/2016: IDC, positive for LVI, 5/12 lymph nodes positive with extracapsular extension, grade 1, multifocal 4  cm, 0.5 cm size E 100%, PR 97%, HER-2 negative, Ki-67 22%, T2 N2 stage IIIa Adjuvant radiation therapy 03/23/2015 to 05/06/2015  Current treatment: Anastrozole 1 mg daily started 05/19/2015 Anastrozole toxicities: tolerating it extremely well without any side effects.  Hypertension:  Extremely high because she ran out of her blood pressure medication.  She is going to pick them up today.  Right arm swelling: We will obtain ultrasound of the right arm to rule out DVT.  If it is negative then I will refer her for physical therapy for lymphedema care. Breast Cancer Surveillance: 1. Breast exam  11/09/2017: Normal 2. Mammogram 10/09/2017 postoperative changes  Breast Density Category B.  Left shoulder surgery 05/30/2017  Return to clinic in 1 yr for follow up.   No orders of the defined types were placed in this encounter.  The patient has a good understanding of the overall plan. she agrees with it. she will call with any problems that may develop before the next visit here.   Harriette Ohara, MD 11/09/17

## 2017-11-09 NOTE — Assessment & Plan Note (Signed)
Right breast invasive ductal carcinoma 3.6 cm by MRI with enlarged right axillary lymph node biopsy proven to be positive for cancer , T2 N1 M0 stage 2B clinical stage.ER positive PR positive HER-2 negative Ki-67 22% Right lumpectomy07/25/2016: IDC, positive for LVI, 5/12 lymph nodes positive with extracapsular extension, grade 1, multifocal 4 cm, 0.5 cm size E 100%, PR 97%, HER-2 negative, Ki-67 22%, T2 N2 stage IIIa Adjuvant radiation therapy 03/23/2015 to 05/06/2015  Current treatment: Anastrozole 1 mg daily started 05/19/2015 Anastrozole toxicities: tolerating it extremely well without any side effects.  Hypertension: Doing much better   Breast Cancer Surveillance: 1. Breast exam  11/09/2017: Normal 2. Mammogram 10/09/2017 postoperative changes  Breast Density Category B.  Left shoulder surgery 05/30/2017  Return to clinic in 1 yr for follow up.

## 2017-11-09 NOTE — Telephone Encounter (Signed)
Gave avs and calendar ° °

## 2017-11-10 ENCOUNTER — Telehealth: Payer: Self-pay | Admitting: *Deleted

## 2017-11-10 ENCOUNTER — Ambulatory Visit (HOSPITAL_COMMUNITY)
Admission: RE | Admit: 2017-11-10 | Discharge: 2017-11-10 | Disposition: A | Discharge status: Home / Self Care | Payer: Medicare Other | Source: Ambulatory Visit | Attending: Hematology and Oncology | Admitting: Hematology and Oncology

## 2017-11-10 ENCOUNTER — Other Ambulatory Visit: Payer: Self-pay | Admitting: Hematology and Oncology

## 2017-11-10 DIAGNOSIS — E669 Obesity, unspecified: Secondary | ICD-10-CM | POA: Diagnosis not present

## 2017-11-10 DIAGNOSIS — M25421 Effusion, right elbow: Secondary | ICD-10-CM | POA: Insufficient documentation

## 2017-11-10 DIAGNOSIS — C50919 Malignant neoplasm of unspecified site of unspecified female breast: Secondary | ICD-10-CM | POA: Diagnosis not present

## 2017-11-10 NOTE — Telephone Encounter (Signed)
Pt returned call, this RN informed her of negative Doppler and need for follow up with Dr. Dalbert Batman. Informed her of 5/14 appt. She voiced understanding.

## 2017-11-10 NOTE — Telephone Encounter (Signed)
This RN received call from [place] with U/S stating pt is negative for upper right extremity DVT - with noted patent vessels - " but her U/S shows like a sebaceous mass "  Pt was discharged home.  Above reviewed with MD who requested pt to be seen by her surgeon - Dr Dalbert Batman - for further evaluation.  This RN contacted [facility] and spoke with [employer] per above need - Dr Dalbert Batman does not have any availability until 6/24- request will be taken to his assis

## 2017-11-10 NOTE — Progress Notes (Addendum)
Preliminary notes- Right upper extremity venous exam completed. Negative for DVT. Incidental findings:  a 3.02x2.03x2.83cm palpable non-vascular structure seen at the region of right upper medial arm distally. And right forearm distal, there is a heterogenous non-vascular structure seen, 1.23x0.61x3.39cm.  Result called Dr. Jennette Kettle.   Patient was instructed to go back home and for further evaluation to the incidental findings.   Marissa Haney (RDMS RVT) 11/10/17 3:24 PM

## 2017-11-10 NOTE — Telephone Encounter (Signed)
This RN called pt and obtained identified VM- message left regarding result of doppler and need for pt to be seen by Dr Dalbert Batman for evaluation of swelling.  Informed her appointment has been obtained on 11/14/2017 - to be at [facility] at 1115 for a 1145 appointment.  This RN requested a return call to verify pt is aware of this call.

## 2017-11-14 DIAGNOSIS — C773 Secondary and unspecified malignant neoplasm of axilla and upper limb lymph nodes: Secondary | ICD-10-CM | POA: Diagnosis not present

## 2017-11-14 DIAGNOSIS — I10 Essential (primary) hypertension: Secondary | ICD-10-CM | POA: Diagnosis not present

## 2017-11-14 DIAGNOSIS — E119 Type 2 diabetes mellitus without complications: Secondary | ICD-10-CM | POA: Diagnosis not present

## 2017-11-14 DIAGNOSIS — Z90722 Acquired absence of ovaries, bilateral: Secondary | ICD-10-CM | POA: Diagnosis not present

## 2017-11-14 DIAGNOSIS — C50111 Malignant neoplasm of central portion of right female breast: Secondary | ICD-10-CM | POA: Diagnosis not present

## 2017-11-14 DIAGNOSIS — Z9079 Acquired absence of other genital organ(s): Secondary | ICD-10-CM | POA: Diagnosis not present

## 2017-11-14 DIAGNOSIS — I890 Lymphedema, not elsewhere classified: Secondary | ICD-10-CM | POA: Diagnosis not present

## 2017-11-14 DIAGNOSIS — C50911 Malignant neoplasm of unspecified site of right female breast: Secondary | ICD-10-CM | POA: Diagnosis not present

## 2017-11-14 DIAGNOSIS — Z90710 Acquired absence of both cervix and uterus: Secondary | ICD-10-CM | POA: Diagnosis not present

## 2017-12-01 ENCOUNTER — Ambulatory Visit: Payer: Medicare Other | Admitting: Rehabilitation

## 2017-12-11 ENCOUNTER — Telehealth: Payer: Self-pay | Admitting: Physical Therapy

## 2017-12-11 NOTE — Telephone Encounter (Signed)
Spoke with pt as she was referred by Dr. Dalbert Batman for right arm lymphedema. She stated that she could not afford to pay what Medicare would not cover and that her arm has gone down and she doesn't think she needs PT right now. She was encouraged to contact us with any questions or if she noticed worsening symptoms in her arm. Marissa Haney, [place] 12/11/17 3:49 PM

## 2018-05-03 ENCOUNTER — Telehealth: Payer: Self-pay

## 2018-05-03 NOTE — Telephone Encounter (Signed)
Pt called to report to Dr.Gudena that she had fallen this morning, and her R knee is very swollen and painful. She states that her daughter will be coming over soon to take her to the ED and have it checked out, but wanted to let Dr.Gudena know. Pt did apply ice on the affected knee and took 2 ibuprofen for the pain. She can't bare much weight on it and needed to ambulate to the bathroom and back. Pt does not have cane or walker available at this time. Suggested that pt elevate her leg and try to immobilize, as much as possible, until she goes to the ED. Suggested that pt call 911, if she starts to have uncontrollable pain, increased swelling, sob, and dizziness. Pt verbalized understanding. Pt will call with updates soon.

## 2018-06-29 DIAGNOSIS — M7660 Achilles tendinitis, unspecified leg: Secondary | ICD-10-CM | POA: Diagnosis not present

## 2018-06-29 DIAGNOSIS — Z6834 Body mass index (BMI) 34.0-34.9, adult: Secondary | ICD-10-CM | POA: Diagnosis not present

## 2018-08-30 ENCOUNTER — Other Ambulatory Visit: Payer: Self-pay | Admitting: Hematology and Oncology

## 2018-08-30 DIAGNOSIS — Z853 Personal history of malignant neoplasm of breast: Secondary | ICD-10-CM

## 2018-10-11 ENCOUNTER — Ambulatory Visit
Admission: RE | Admit: 2018-10-11 | Discharge: 2018-10-11 | Disposition: A | Discharge status: Home / Self Care | Payer: Medicare Other | Source: Ambulatory Visit | Attending: Hematology and Oncology | Admitting: Hematology and Oncology

## 2018-10-11 ENCOUNTER — Other Ambulatory Visit: Payer: Self-pay

## 2018-10-11 DIAGNOSIS — Z853 Personal history of malignant neoplasm of breast: Secondary | ICD-10-CM | POA: Diagnosis not present

## 2018-10-11 HISTORY — DX: Personal history of antineoplastic chemotherapy: Z92.21

## 2018-10-11 HISTORY — DX: Personal history of irradiation: Z92.3

## 2018-11-06 ENCOUNTER — Telehealth: Payer: Self-pay | Admitting: Hematology and Oncology

## 2018-11-06 NOTE — Telephone Encounter (Signed)
Spoke with patient and phone mtg has been scheduled.

## 2018-11-06 NOTE — Assessment & Plan Note (Signed)
Right breast invasive ductal carcinoma 3.6 cm by MRI with enlarged right axillary lymph node biopsy proven to be positive for cancer , T2 N1 M0 stage 2B clinical stage.ER positive PR positive HER-2 negative Ki-67 22% Right lumpectomy07/25/2016: IDC, positive for LVI, 5/12 lymph nodes positive with extracapsular extension, grade 1, multifocal 4 cm, 0.5 cm size E 100%, PR 97%, HER-2 negative, Ki-67 22%, T2 N2 stage IIIa Adjuvant radiation therapy 03/23/2015 to 05/06/2015  Current treatment: Anastrozole 1 mg daily started 05/19/2015 Anastrozole toxicities: tolerating it extremely well without any side effects.  Hypertension:   Right arm swelling:   Breast Cancer Surveillance: 1. Breast exam 11/09/2017: Normal 2. Mammogram 10/11/2018 postoperative changes  Breast Density Category B.  Left shoulder surgery 05/30/2017  Return to clinic in1 yrfor follow up

## 2018-11-08 NOTE — Progress Notes (Signed)
HEMATOLOGY-ONCOLOGY TELEPHONE VISIT PROGRESS NOTE  I connected with Marissa Haney on 11/09/2018 at  8:15 AM EDT by telephone and verified that I am speaking with the correct person using two identifiers.  I discussed the limitations, risks, security and privacy concerns of performing an evaluation and management service by telephone and the availability of in person appointments.  I also discussed with the patient that there may be a patient responsible charge related to this service. The patient expressed understanding and agreed to proceed.   History of Present Illness: Marissa Haney is a 63 y.o. female with above-mentioned history of right breast cancer who underwent lumpectomy followed by adjuvant radiation and is currently on anastrozole. I last saw her a year ago. Her most recent mammogram on 10/11/18 showed no evidence of malignancy bilaterally. She presents today for annual follow-up.     Breast cancer of upper-outer quadrant of right female breast ([place])   05/27/2014 Mammogram    Right breast mass by mammogram 5.4 cm by ultrasound 4 x 4 X 2 cm    05/28/2014 Initial Biopsy    Right breast biopsy 11:30: Invasive ductal carcinoma with lymphovascular invasion grade 1/2, ER 100%, PR 97%, Ki-67 22%, HER-2 negative ratio 1.26 average copy #1.7, lymph node biopsy also positive    06/16/2014 Breast MRI    right breast cancer: 3.6 cm at 12:00 position mildly enlarged right axillary lymph node: 5 mm oval enhancing mass in the central left breast could be an intramammary lymph node    06/16/2014 Clinical Stage    Stage IIB (T2 N1)      07/10/2014 - 11/11/2014 Neo-Adjuvant Chemotherapy    Dose dense Adriamycin and Cytoxan 4 followed by Abraxane 12    07/10/2014 Procedure    PREVENT clinical trial (Lipitor vs Placebo) in patients undergoing cardiotoxic chemotherapy    12/08/2014 Breast MRI    Mass appears smaller, now measuring 2.3 x 4.9 x 2.7 cm. Previously using the same dimensions mass  measured 3.4 x 4.6 x 2.7 cm. Improvement of axill LN    01/26/2015 Definitive Surgery    Right lumpectomy: IDC, positive for LVI, 5/12 lymph nodes positive with extracapsular extension, grade 1, multifocal 4 cm, 0.5 cm size E 100%, PR 97%, HER-2 negative, Ki-67 22%    01/26/2015 Pathologic Stage    Stage IIIA: T2 N2    03/23/2015 - 05/06/2015 Radiation Therapy    Adjuvant breast radiation [facility]): Right breast 50.4 Gy over 28 fractions; right supraclavicular 50.4 Gy; right breast boost: 10 Gy over 5 fractions.    05/19/2015 -  Anti-estrogen oral therapy    Anastrozole 1 mg daily    06/19/2015 Survivorship    Survivorship care plan given to patient     Observations/Objective:     Assessment Plan:  Breast cancer of upper-outer quadrant of right female breast ([place]) Right breast invasive ductal carcinoma 3.6 cm by MRI with enlarged right axillary lymph node biopsy proven to be positive for cancer , T2 N1 M0 stage 2B clinical stage.ER positive PR positive HER-2 negative Ki-67 22% Right lumpectomy07/25/2016: IDC, positive for LVI, 5/12 lymph nodes positive with extracapsular extension, grade 1, multifocal 4 cm, 0.5 cm size E 100%, PR 97%, HER-2 negative, Ki-67 22%, T2 N2 stage IIIa Adjuvant radiation therapy 03/23/2015 to 05/06/2015  Current treatment: Anastrozole 1 mg daily started 05/19/2015 Anastrozole toxicities: tolerating it extremely well without any side effects.  Hypertension: patient says its better.  Right arm swelling: getting better  Breast Cancer Surveillance:  1. Breast exam 11/09/2017: Normal 2. Mammogram 10/11/2018 postoperative changes  Breast Density Category B.  Left shoulder surgery 05/30/2017  Return to clinic in1 yrfor follow up    I discussed the assessment and treatment plan with the patient. The patient was provided an opportunity to ask questions and all were answered. The patient agreed with the plan and demonstrated an understanding of the  instructions. The patient was advised to call back or seek an in-person evaluation if the symptoms worsen or if the condition fails to improve as anticipated.   I provided 12 minutes of non-face-to-face time during this encounter.   Rulon Eisenmenger, MD 11/09/2018    I, Molly Dorshimer, am acting as scribe for Nicholas Lose, MD.  I have reviewed the above documentation for accuracy and completeness, and I agree with the above.

## 2018-11-09 ENCOUNTER — Inpatient Hospital Stay: Payer: Medicare Other | Attending: Hematology and Oncology | Admitting: Hematology and Oncology

## 2018-11-09 DIAGNOSIS — Z170 Estrogen receptor positive status [ER+]: Secondary | ICD-10-CM | POA: Diagnosis not present

## 2018-11-09 DIAGNOSIS — C50411 Malignant neoplasm of upper-outer quadrant of right female breast: Secondary | ICD-10-CM | POA: Diagnosis not present

## 2018-11-09 MED ORDER — ANASTROZOLE 1 MG PO TABS: 1.0000 mg | ORAL_TABLET | Freq: Every day | ORAL | 3 refills | Status: DC

## 2018-11-09 MED ORDER — METFORMIN HCL 500 MG PO TABS: 500.0000 mg | ORAL_TABLET | Freq: Two times a day (BID) | ORAL | Status: AC

## 2018-11-19 DIAGNOSIS — E7800 Pure hypercholesterolemia, unspecified: Secondary | ICD-10-CM | POA: Diagnosis not present

## 2018-11-19 DIAGNOSIS — I10 Essential (primary) hypertension: Secondary | ICD-10-CM | POA: Diagnosis not present

## 2018-11-19 DIAGNOSIS — M79672 Pain in left foot: Secondary | ICD-10-CM | POA: Diagnosis not present

## 2018-11-19 DIAGNOSIS — Z1331 Encounter for screening for depression: Secondary | ICD-10-CM | POA: Diagnosis not present

## 2018-11-19 DIAGNOSIS — E1165 Type 2 diabetes mellitus with hyperglycemia: Secondary | ICD-10-CM | POA: Diagnosis not present

## 2019-01-03 DIAGNOSIS — Z90722 Acquired absence of ovaries, bilateral: Secondary | ICD-10-CM | POA: Diagnosis not present

## 2019-01-03 DIAGNOSIS — E119 Type 2 diabetes mellitus without complications: Secondary | ICD-10-CM | POA: Diagnosis not present

## 2019-01-03 DIAGNOSIS — Z90710 Acquired absence of both cervix and uterus: Secondary | ICD-10-CM | POA: Diagnosis not present

## 2019-01-03 DIAGNOSIS — C50111 Malignant neoplasm of central portion of right female breast: Secondary | ICD-10-CM | POA: Diagnosis not present

## 2019-01-03 DIAGNOSIS — Z9079 Acquired absence of other genital organ(s): Secondary | ICD-10-CM | POA: Diagnosis not present

## 2019-01-03 DIAGNOSIS — C773 Secondary and unspecified malignant neoplasm of axilla and upper limb lymph nodes: Secondary | ICD-10-CM | POA: Diagnosis not present

## 2019-01-03 DIAGNOSIS — C50911 Malignant neoplasm of unspecified site of right female breast: Secondary | ICD-10-CM | POA: Diagnosis not present

## 2019-01-03 DIAGNOSIS — Z6837 Body mass index (BMI) 37.0-37.9, adult: Secondary | ICD-10-CM | POA: Diagnosis not present

## 2019-05-02 DIAGNOSIS — M79602 Pain in left arm: Secondary | ICD-10-CM | POA: Diagnosis not present

## 2019-05-02 DIAGNOSIS — Z6836 Body mass index (BMI) 36.0-36.9, adult: Secondary | ICD-10-CM | POA: Diagnosis not present

## 2019-05-02 DIAGNOSIS — I10 Essential (primary) hypertension: Secondary | ICD-10-CM | POA: Diagnosis not present

## 2019-05-02 DIAGNOSIS — E1165 Type 2 diabetes mellitus with hyperglycemia: Secondary | ICD-10-CM | POA: Diagnosis not present

## 2019-07-17 DIAGNOSIS — Z20822 Contact with and (suspected) exposure to covid-19: Secondary | ICD-10-CM | POA: Diagnosis not present

## 2019-11-11 ENCOUNTER — Ambulatory Visit: Payer: Medicare Other | Admitting: Hematology and Oncology

## 2019-12-09 ENCOUNTER — Other Ambulatory Visit: Payer: Self-pay | Admitting: Hematology and Oncology

## 2019-12-09 ENCOUNTER — Telehealth: Payer: Self-pay | Admitting: Hematology and Oncology

## 2019-12-09 DIAGNOSIS — Z170 Estrogen receptor positive status [ER+]: Secondary | ICD-10-CM

## 2019-12-09 NOTE — Telephone Encounter (Signed)
Scheduled appt per 6/7 sch message - no answer/ left message with appt date and time

## 2020-01-24 ENCOUNTER — Ambulatory Visit: Payer: Medicare Other | Admitting: Hematology and Oncology

## 2020-02-05 NOTE — Progress Notes (Signed)
Patient Care Team: Cyndi Bender, PA-C as PCP - General (Physician Assistant) Nicholas Lose, MD as Consulting Physician (Hematology and Oncology) Fanny Skates, MD as Consulting Physician (General Surgery) Kyung Rudd, MD as Consulting Physician (Radiation Oncology) Sylvan Cheese, NP as Nurse Practitioner (Hematology and Oncology)  DIAGNOSIS:    ICD-10-CM   1. Malignant neoplasm of upper-outer quadrant of right breast in female, estrogen receptor positive ([place])  C50.411    Z17.0     SUMMARY OF ONCOLOGIC HISTORY: Oncology History  Breast cancer of upper-outer quadrant of right female breast ([place])  05/27/2014 Mammogram   Right breast mass by mammogram 5.4 cm by ultrasound 4 x 4 X 2 cm   05/28/2014 Initial Biopsy   Right breast biopsy 11:30: Invasive ductal carcinoma with lymphovascular invasion grade 1/2, ER 100%, PR 97%, Ki-67 22%, HER-2 negative ratio 1.26 average copy #1.7, lymph node biopsy also positive   06/16/2014 Breast MRI   right breast cancer: 3.6 cm at 12:00 position mildly enlarged right axillary lymph node: 5 mm oval enhancing mass in the central left breast could be an intramammary lymph node   06/16/2014 Clinical Stage   Stage IIB (T2 N1)     07/10/2014 - 11/11/2014 Neo-Adjuvant Chemotherapy   Dose dense Adriamycin and Cytoxan 4 followed by Abraxane 12   07/10/2014 Procedure   PREVENT clinical trial (Lipitor vs Placebo) in patients undergoing cardiotoxic chemotherapy   12/08/2014 Breast MRI   Mass appears smaller, now measuring 2.3 x 4.9 x 2.7 cm. Previously using the same dimensions mass measured 3.4 x 4.6 x 2.7 cm. Improvement of axill LN   01/26/2015 Definitive Surgery   Right lumpectomy: IDC, positive for LVI, 5/12 lymph nodes positive with extracapsular extension, grade 1, multifocal 4 cm, 0.5 cm size E 100%, PR 97%, HER-2 negative, Ki-67 22%   01/26/2015 Pathologic Stage   Stage IIIA: T2 N2   03/23/2015 - 05/06/2015 Radiation Therapy   Adjuvant  breast radiation [facility]): Right breast 50.4 Gy over 28 fractions; right supraclavicular 50.4 Gy; right breast boost: 10 Gy over 5 fractions.   05/19/2015 -  Anti-estrogen oral therapy   Anastrozole 1 mg daily   06/19/2015 Survivorship   Survivorship care plan given to patient     CHIEF COMPLIANT: Follow-up of right breast cancer on anastrozole   INTERVAL HISTORY: Marissa Haney is a 64 y.o. with above-mentioned history of right breast cancer who underwent lumpectomy, radiation, and is currently on anastrozole. She presents today for annual follow-up.   She denies any problems or concerns with her breasts.  She is tolerating anastrozole extremely well.  ALLERGIES:  has No Known Allergies.  MEDICATIONS:  Current Outpatient Medications  Medication Sig Dispense Refill  . anastrozole (ARIMIDEX) 1 MG tablet TAKE 1 TABLET BY MOUTH EVERY DAY 30 tablet 11  . lisinopril-hydrochlorothiazide (PRINZIDE,ZESTORETIC) 20-12.5 MG tablet Take 1 tablet 2 (two) times daily by mouth.  5  . metFORMIN (GLUCOPHAGE) 500 MG tablet Take 1 tablet (500 mg total) by mouth 2 (two) times daily with a meal.     No current facility-administered medications for this visit.    PHYSICAL EXAMINATION: ECOG PERFORMANCE STATUS: 1 - Symptomatic but completely ambulatory  Vitals:   02/06/20 1608  BP: (!) 193/97  Pulse: 80  Resp: 18  Temp: 98.3 F (36.8 C)  SpO2: 100%   Filed Weights   02/06/20 1608  Weight: 238 lb 8 oz (108.2 kg)    BREAST: No palpable masses or nodules in either right or  left breasts. No palpable axillary supraclavicular or infraclavicular adenopathy no breast tenderness or nipple discharge. (exam performed in the presence of a chaperone)  LABORATORY DATA:  I have reviewed the data as listed CMP Latest Ref Rng & Units 05/22/2017 01/26/2015 01/20/2015  Glucose 65 - 99 mg/dL 269(H) - 255(H)  BUN 6 - 20 mg/dL 15 - 9  Creatinine 0.44 - 1.00 mg/dL 0.90 0.82 1.10(H)  Sodium 135 - 145 mmol/L 138 -  139  Potassium 3.5 - 5.1 mmol/L 3.4(L) - 3.3(L)  Chloride 101 - 111 mmol/L 102 - 105  CO2 22 - 32 mmol/L 28 - 26  Calcium 8.9 - 10.3 mg/dL 8.7(L) - 8.7(L)  Total Protein 6.5 - 8.1 g/dL - - 7.5  Total Bilirubin 0.3 - 1.2 mg/dL - - 1.1  Alkaline Phos 38 - 126 U/L - - 122  AST 15 - 41 U/L - - 21  ALT 14 - 54 U/L - - 17    Lab Results  Component Value Date   WBC 13.4 (H) 01/26/2015   HGB 13.2 01/26/2015   HCT 39.6 01/26/2015   MCV 94.5 01/26/2015   PLT 189 01/26/2015   NEUTROABS 4.9 01/20/2015    ASSESSMENT & PLAN:  Breast cancer of upper-outer quadrant of right female breast ([facility]) Right breast invasive ductal carcinoma 3.6 cm by MRI with enlarged right axillary lymph node biopsy proven to be positive for cancer , T2 N1 M0 stage 2B clinical stage.ER positive PR positive HER-2 negative Ki-67 22% Right lumpectomy07/25/2016: IDC, positive for LVI, 5/12 lymph nodes positive with extracapsular extension, grade 1, multifocal 4 cm, 0.5 cm size E 100%, PR 97%, HER-2 negative, Ki-67 22%, T2 N2 stage IIIa Adjuvant radiation therapy 03/23/2015 to 05/06/2015  Current treatment: Anastrozole 1 mg daily started 05/19/2015 Anastrozole toxicities: tolerating it extremely well without any side effects. I recommended that she continue with anastrozole for at least 10 years based upon very high risk of recurrence.  Hypertension:Continues to remain high.  She attributes this to rushing into come to see [place].  She tells me that her blood pressure is not usually that high.   Breast Cancer Surveillance: 1. Breast exam8/11/2019: Normal 2. Mammogram4/03/2019 postoperative changesBreast Density Category B.  We will need to order new mammograms.  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  She works in [place]. Return to clinic in1 yrfor follow up    No orders of the defined types were placed in this encounter.  The patient has a  good understanding of the overall plan. she agrees with it. she will call with any problems that may develop before the next visit here.  Total time spent: 20 mins including face to face time and time spent for planning, charting and coordination of care  Nicholas Lose, MD 02/06/2020  I, Cloyde Reams Dorshimer, am acting as scribe for Dr. Nicholas Lose.  I have reviewed the above documentation for accuracy and completeness, and I agree with the above.

## 2020-02-06 ENCOUNTER — Other Ambulatory Visit: Payer: Self-pay

## 2020-02-06 ENCOUNTER — Inpatient Hospital Stay: Payer: Medicare Other | Attending: Hematology and Oncology | Admitting: Hematology and Oncology

## 2020-02-06 DIAGNOSIS — C50411 Malignant neoplasm of upper-outer quadrant of right female breast: Secondary | ICD-10-CM | POA: Insufficient documentation

## 2020-02-06 DIAGNOSIS — Z170 Estrogen receptor positive status [ER+]: Secondary | ICD-10-CM | POA: Insufficient documentation

## 2020-02-06 DIAGNOSIS — Z9221 Personal history of antineoplastic chemotherapy: Secondary | ICD-10-CM | POA: Diagnosis not present

## 2020-02-06 DIAGNOSIS — Z79811 Long term (current) use of aromatase inhibitors: Secondary | ICD-10-CM | POA: Diagnosis not present

## 2020-02-06 DIAGNOSIS — I10 Essential (primary) hypertension: Secondary | ICD-10-CM | POA: Insufficient documentation

## 2020-02-06 DIAGNOSIS — Z7984 Long term (current) use of oral hypoglycemic drugs: Secondary | ICD-10-CM | POA: Insufficient documentation

## 2020-02-06 DIAGNOSIS — Z923 Personal history of irradiation: Secondary | ICD-10-CM | POA: Diagnosis not present

## 2020-02-06 DIAGNOSIS — C773 Secondary and unspecified malignant neoplasm of axilla and upper limb lymph nodes: Secondary | ICD-10-CM | POA: Insufficient documentation

## 2020-02-06 MED ORDER — ANASTROZOLE 1 MG PO TABS: 1.0000 mg | ORAL_TABLET | Freq: Every day | ORAL | 3 refills | Status: DC

## 2020-02-06 NOTE — Assessment & Plan Note (Signed)
Right breast invasive ductal carcinoma 3.6 cm by MRI with enlarged right axillary lymph node biopsy proven to be positive for cancer , T2 N1 M0 stage 2B clinical stage.ER positive PR positive HER-2 negative Ki-67 22% Right lumpectomy07/25/2016: IDC, positive for LVI, 5/12 lymph nodes positive with extracapsular extension, grade 1, multifocal 4 cm, 0.5 cm size E 100%, PR 97%, HER-2 negative, Ki-67 22%, T2 N2 stage IIIa Adjuvant radiation therapy 03/23/2015 to 05/06/2015  Current treatment: Anastrozole 1 mg daily started 05/19/2015 Anastrozole toxicities: tolerating it extremely well without any side effects.  Hypertension:patient says its better.  Right arm swelling: getting better  Breast Cancer Surveillance: 1. Breast exam8/11/2019: Normal 2. Mammogram4/03/2019 postoperative changesBreast Density Category B.  We will need to order new mammograms.  Left shoulder surgery 05/30/2017  Return to clinic in1 yrfor follow up

## 2020-02-12 ENCOUNTER — Other Ambulatory Visit: Payer: Self-pay | Admitting: Hematology and Oncology

## 2020-02-12 DIAGNOSIS — Z170 Estrogen receptor positive status [ER+]: Secondary | ICD-10-CM

## 2020-03-16 ENCOUNTER — Ambulatory Visit
Admission: RE | Admit: 2020-03-16 | Discharge: 2020-03-16 | Disposition: A | Discharge status: Home / Self Care | Payer: Medicare Other | Source: Ambulatory Visit | Attending: Hematology and Oncology | Admitting: Hematology and Oncology

## 2020-03-16 ENCOUNTER — Other Ambulatory Visit: Payer: Self-pay

## 2020-03-16 DIAGNOSIS — Z170 Estrogen receptor positive status [ER+]: Secondary | ICD-10-CM

## 2020-08-14 ENCOUNTER — Other Ambulatory Visit: Payer: Self-pay | Admitting: Hematology and Oncology

## 2020-08-14 DIAGNOSIS — Z170 Estrogen receptor positive status [ER+]: Secondary | ICD-10-CM

## 2020-08-14 DIAGNOSIS — C50411 Malignant neoplasm of upper-outer quadrant of right female breast: Secondary | ICD-10-CM

## 2020-09-19 IMAGING — MG DIGITAL DIAGNOSTIC BILAT W/ TOMO W/ CAD
6 of 11 series · 6 of 31 positions shown · non-contrast
Comparison: Previous exam(s).

ACR Breast Density Category a: The breast tissue is almost entirely
fatty.

CLINICAL DATA: 64-year-old female presenting for routine annual
surveillance status post right breast lumpectomy in 7647.

EXAM:
DIGITAL DIAGNOSTIC BILATERAL MAMMOGRAM WITH TOMO AND CAD

[R MLO]
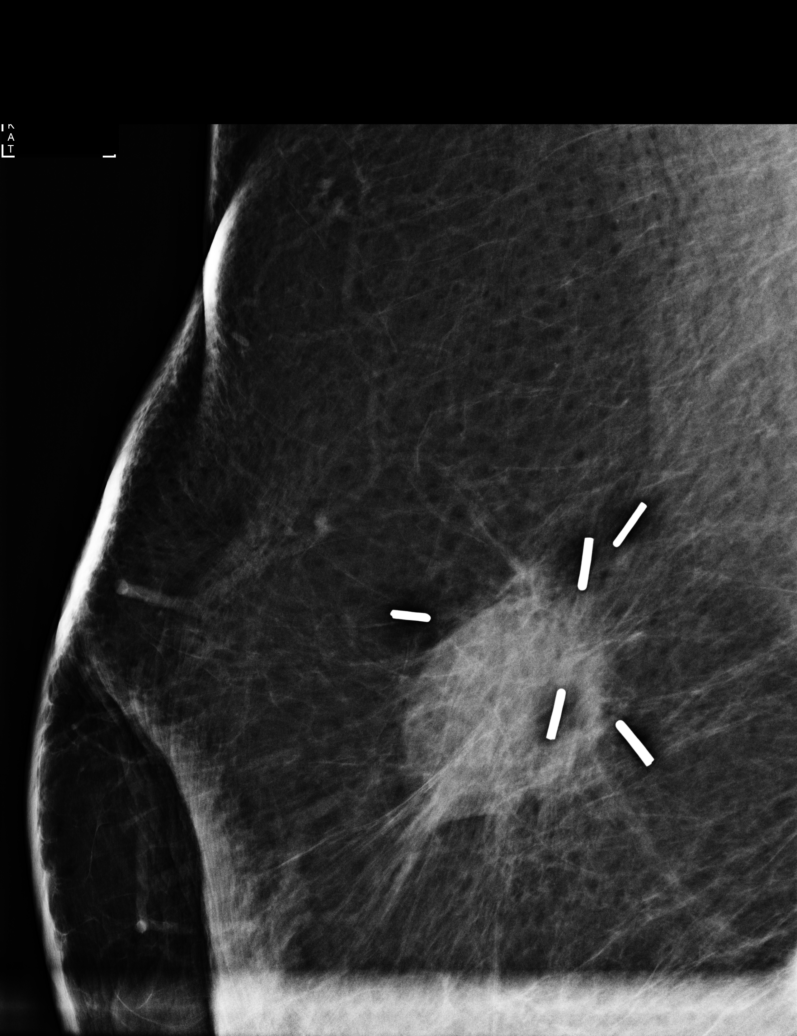

[R CC synth-2D]
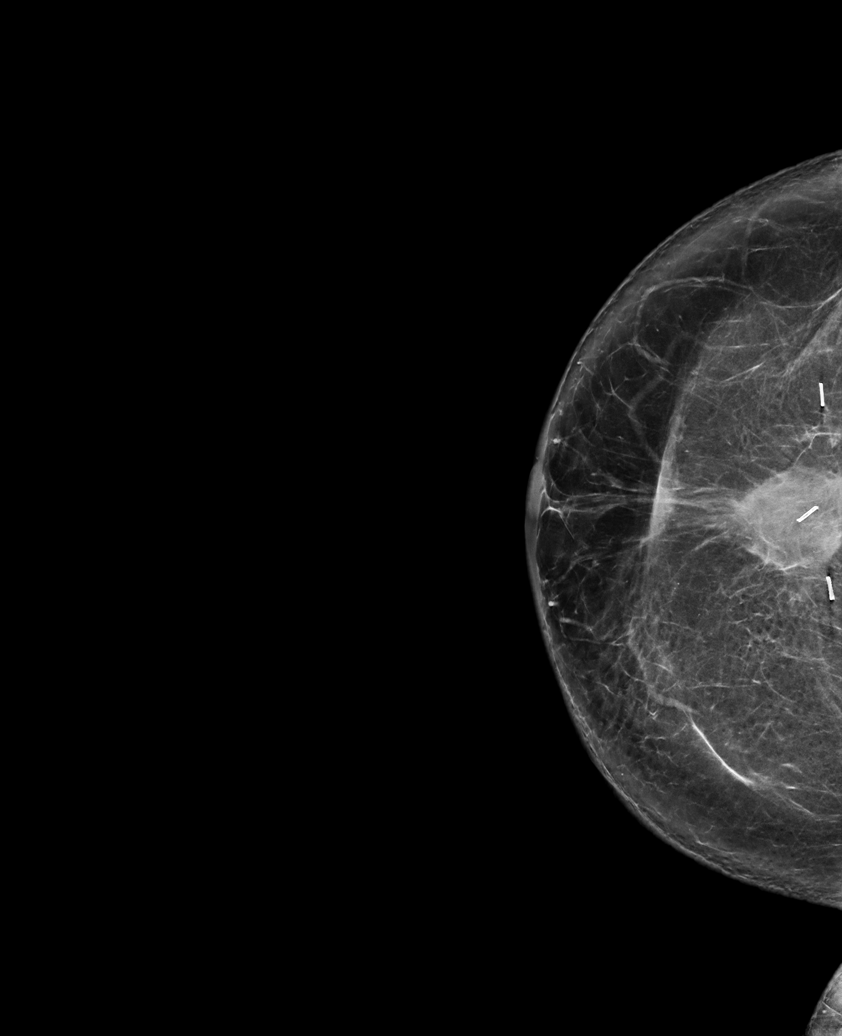

[L CC synth-2D]
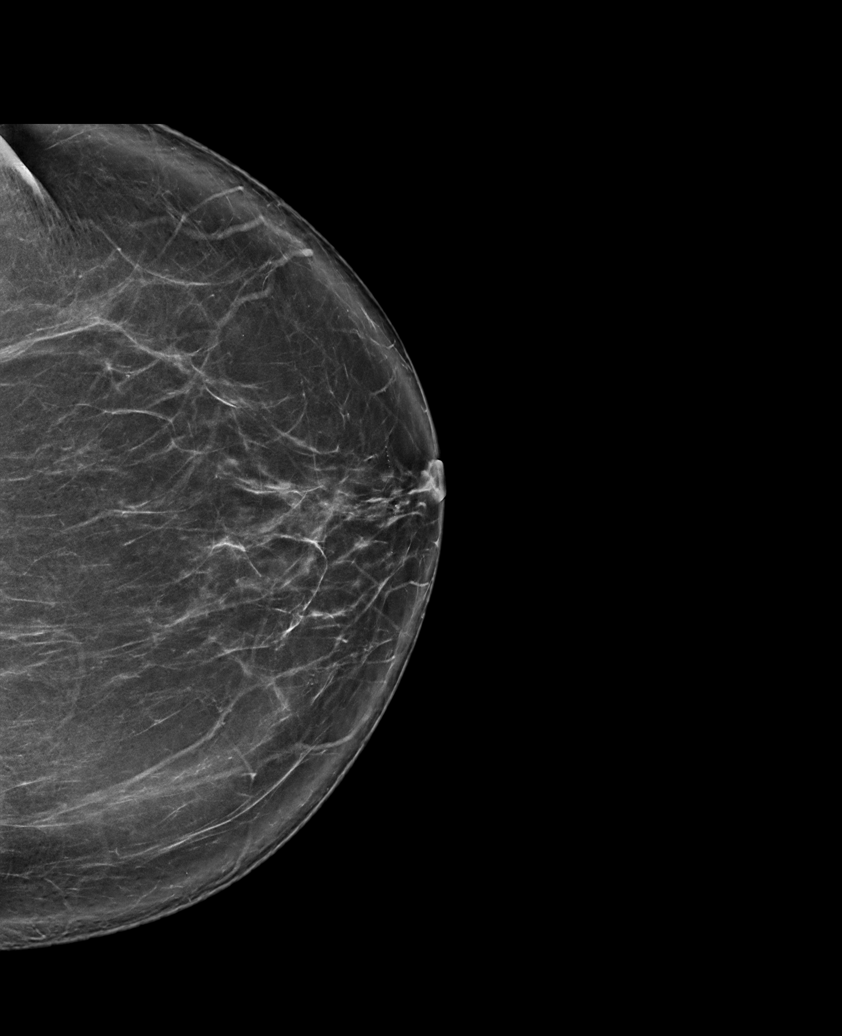

[L MLO synth-2D (1 of 2)]
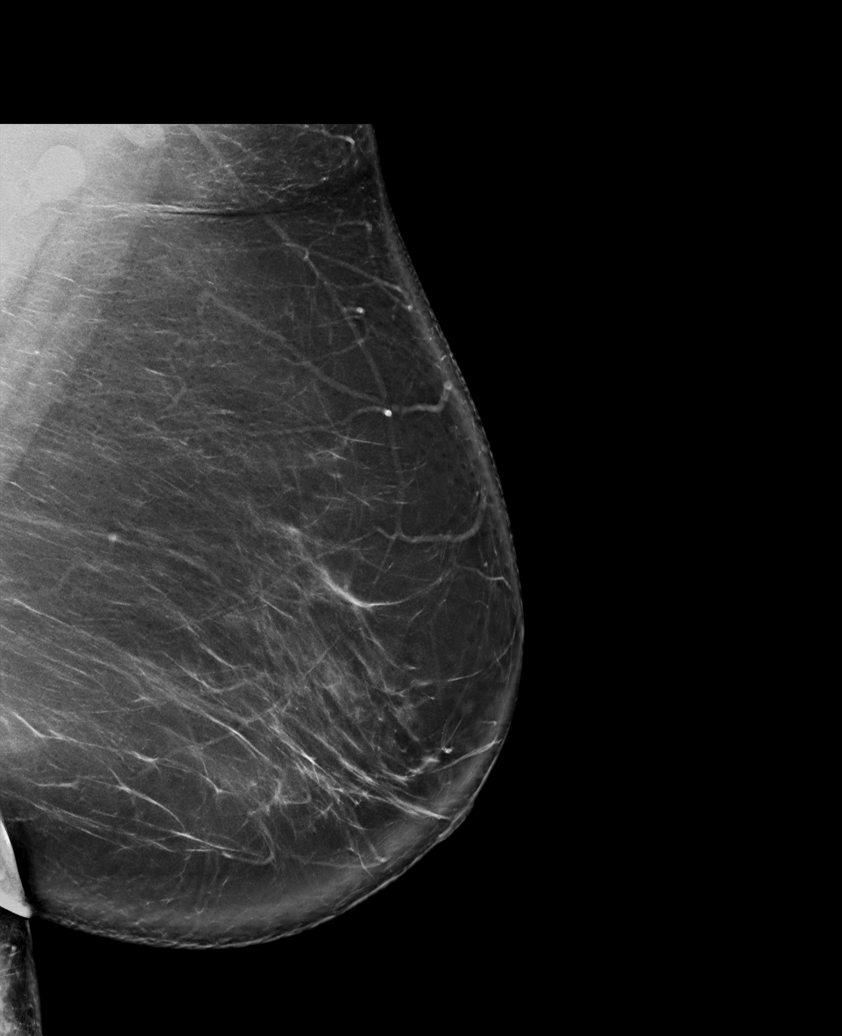

[L MLO synth-2D (2 of 2)]
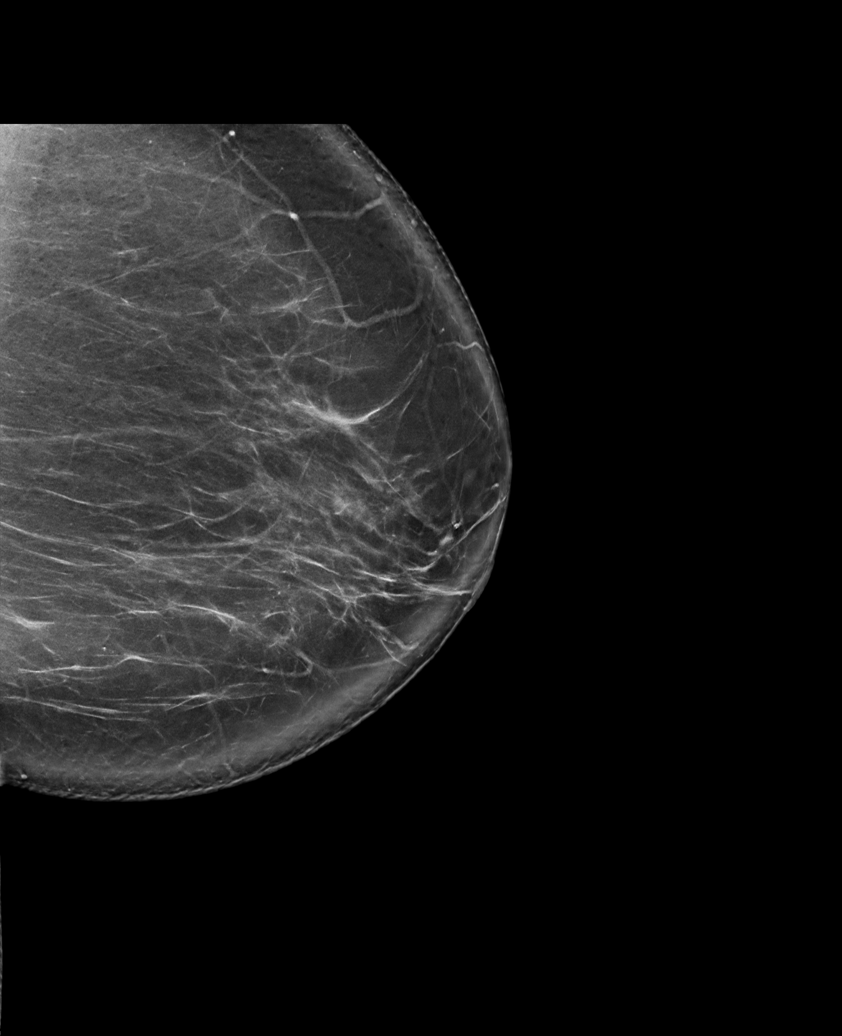

[R MLO synth-2D]
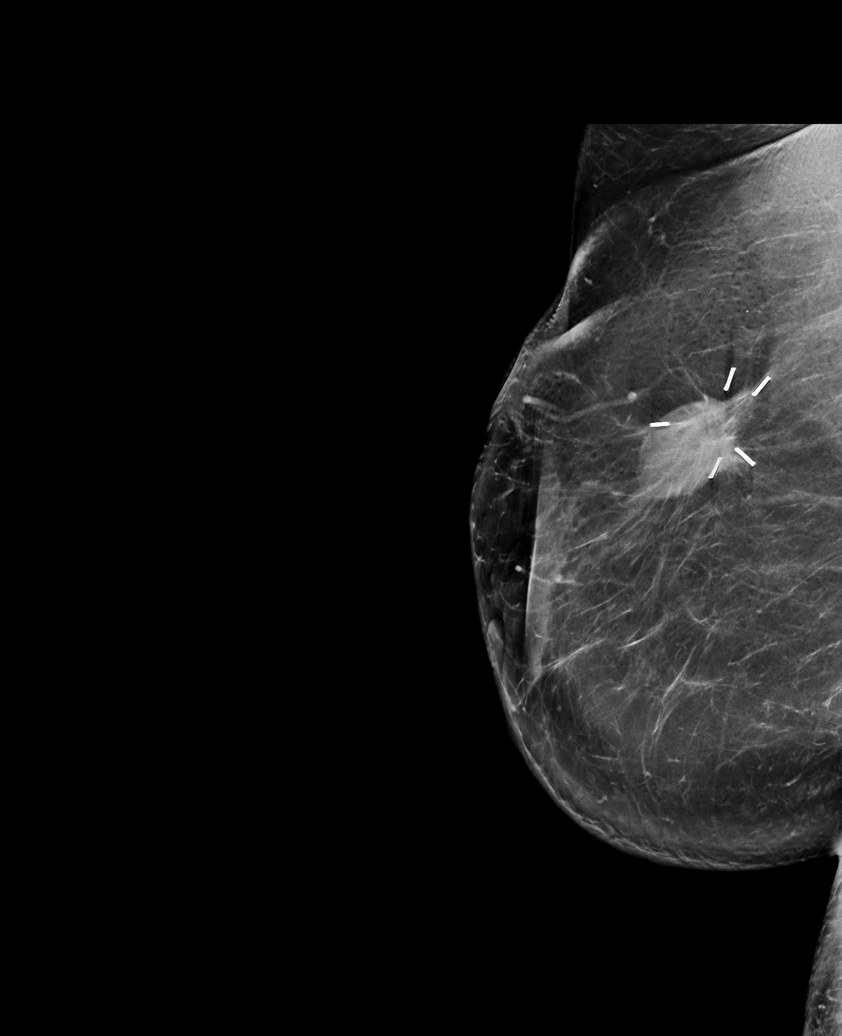

[6 of 31 positions shown; findings below may reference images not displayed]

FINDINGS: The right breast lumpectomy site is stable. No suspicious
calcifications, masses or areas of distortion are seen in the
bilateral breasts.

Mammographic images were processed with CAD.
IMPRESSION: Stable right breast lumpectomy site. No mammographic evidence of
malignancy in the bilateral breasts.

RECOMMENDATION:
Screening mammogram in one year.(Code:[id number])

I have discussed the findings and recommendations with the patient.
If applicable, a reminder letter will be sent to the patient
regarding the next appointment.

BI-RADS CATEGORY  2: Benign.

## 2021-02-04 ENCOUNTER — Inpatient Hospital Stay: Payer: Medicare HMO | Attending: Hematology and Oncology | Admitting: Hematology and Oncology

## 2021-02-04 NOTE — Assessment & Plan Note (Deleted)
Right breast invasive ductal carcinoma 3.6 cm by MRI with enlarged right axillary lymph node biopsy proven to be positive for cancer , T2 N1 M0 stage 2B clinical stage.ER positive PR positive HER-2 negative Ki-67 22% Right lumpectomy07/25/2016: IDC, positive for LVI, 5/12 lymph nodes positive with extracapsular extension, grade 1, multifocal 4 cm, 0.5 cm size E 100%, PR 97%, HER-2 negative, Ki-67 22%, T2 N2 stage IIIa Adjuvant radiation therapy 03/23/2015 to 05/06/2015  Current treatment: Anastrozole 1 mg daily started 05/19/2015 Anastrozole toxicities: tolerating it extremely well without any side effects. I recommended that she continue with anastrozole for at least 10 years based upon very high risk of recurrence.  Hypertension:Continues to remain high.  She attributes this to rushing into come to see [place].  She tells me that her blood pressure is not usually that high.   Breast Cancer Surveillance: 1. Breast exam8/10/2020: Normal 2. 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  She works in [place]. Return to clinic in1 yrfor follow up

## 2021-05-25 DIAGNOSIS — C50911 Malignant neoplasm of unspecified site of right female breast: Secondary | ICD-10-CM | POA: Diagnosis not present

## 2021-05-25 DIAGNOSIS — M79672 Pain in left foot: Secondary | ICD-10-CM | POA: Diagnosis not present

## 2021-05-25 DIAGNOSIS — Z1331 Encounter for screening for depression: Secondary | ICD-10-CM | POA: Diagnosis not present

## 2021-05-25 DIAGNOSIS — G4700 Insomnia, unspecified: Secondary | ICD-10-CM | POA: Diagnosis not present

## 2021-05-25 DIAGNOSIS — E7800 Pure hypercholesterolemia, unspecified: Secondary | ICD-10-CM | POA: Diagnosis not present

## 2021-05-25 DIAGNOSIS — E1169 Type 2 diabetes mellitus with other specified complication: Secondary | ICD-10-CM | POA: Diagnosis not present

## 2021-05-25 DIAGNOSIS — I10 Essential (primary) hypertension: Secondary | ICD-10-CM | POA: Diagnosis not present

## 2021-05-25 DIAGNOSIS — Z6837 Body mass index (BMI) 37.0-37.9, adult: Secondary | ICD-10-CM | POA: Diagnosis not present

## 2021-05-26 ENCOUNTER — Telehealth: Payer: Self-pay | Admitting: Hematology and Oncology

## 2021-05-26 NOTE — Telephone Encounter (Signed)
Scheduled appt per 11/23 referral. Pt is established with Dr. Lindi Adie. Called pt, no answer. Left msg with appt date and time.

## 2021-07-07 NOTE — Progress Notes (Incomplete)
Patient Care Team: Cyndi Bender, PA-C as PCP - General (Physician Assistant) Nicholas Lose, MD as Consulting Physician (Hematology and Oncology) Fanny Skates, MD as Consulting Physician (General Surgery) Kyung Rudd, MD as Consulting Physician (Radiation Oncology) Sylvan Cheese, NP as Nurse Practitioner (Hematology and Oncology)  DIAGNOSIS: No diagnosis found.  SUMMARY OF ONCOLOGIC HISTORY: Oncology History  Breast cancer of upper-outer quadrant of right female breast ([place])  05/27/2014 Mammogram   Right breast mass by mammogram 5.4 cm by ultrasound 4 x 4 X 2 cm   05/28/2014 Initial Biopsy   Right breast biopsy 11:30: Invasive ductal carcinoma with lymphovascular invasion grade 1/2, ER 100%, PR 97%, Ki-67 22%, HER-2 negative ratio 1.26 average copy #1.7, lymph node biopsy also positive   06/16/2014 Breast MRI   right breast cancer: 3.6 cm at 12:00 position mildly enlarged right axillary lymph node: 5 mm oval enhancing mass in the central left breast could be an intramammary lymph node   06/16/2014 Clinical Stage   Stage IIB (T2 N1)     07/10/2014 - 11/11/2014 Neo-Adjuvant Chemotherapy   Dose dense Adriamycin and Cytoxan 4 followed by Abraxane 12   07/10/2014 Procedure   PREVENT clinical trial (Lipitor vs Placebo) in patients undergoing cardiotoxic chemotherapy   12/08/2014 Breast MRI   Mass appears smaller, now measuring 2.3 x 4.9 x 2.7 cm. Previously using the same dimensions mass measured 3.4 x 4.6 x 2.7 cm. Improvement of axill LN   01/26/2015 Definitive Surgery   Right lumpectomy: IDC, positive for LVI, 5/12 lymph nodes positive with extracapsular extension, grade 1, multifocal 4 cm, 0.5 cm size E 100%, PR 97%, HER-2 negative, Ki-67 22%   01/26/2015 Pathologic Stage   Stage IIIA: T2 N2   03/23/2015 - 05/06/2015 Radiation Therapy   Adjuvant breast radiation [facility]): Right breast 50.4 Gy over 28 fractions; right supraclavicular 50.4 Gy; right breast boost: 10 Gy  over 5 fractions.   05/19/2015 -  Anti-estrogen oral therapy   Anastrozole 1 mg daily   06/19/2015 Survivorship   Survivorship care plan given to patient     CHIEF COMPLIANT: Follow-up of right breast cancer on anastrozole   INTERVAL HISTORY: Marissa BUCHOLZ is a 66 y.o. with above-mentioned history of right breast cancer who underwent lumpectomy, radiation, and is currently on anastrozole. She presents today for annual follow-up.   ALLERGIES:  has No Known Allergies.  MEDICATIONS:  Current Outpatient Medications  Medication Sig Dispense Refill   anastrozole (ARIMIDEX) 1 MG tablet TAKE 1 TABLET BY MOUTH EVERY DAY 90 tablet 3   lisinopril-hydrochlorothiazide (PRINZIDE,ZESTORETIC) 20-12.5 MG tablet Take 1 tablet 2 (two) times daily by mouth.  5   metFORMIN (GLUCOPHAGE) 500 MG tablet Take 1 tablet (500 mg total) by mouth 2 (two) times daily with a meal.     No current facility-administered medications for this visit.    PHYSICAL EXAMINATION: ECOG PERFORMANCE STATUS: {CHL ONC ECOG PS:[phone]}  There were no vitals filed for this visit. There were no vitals filed for this visit.  BREAST:*** No palpable masses or nodules in either right or left breasts. No palpable axillary supraclavicular or infraclavicular adenopathy no breast tenderness or nipple discharge. (exam performed in the presence of a chaperone)  LABORATORY DATA:  I have reviewed the data as listed CMP Latest Ref Rng & Units 05/22/2017 01/26/2015 01/20/2015  Glucose 65 - 99 mg/dL 269(H) - 255(H)  BUN 6 - 20 mg/dL 15 - 9  Creatinine 0.44 - 1.00 mg/dL 0.90 0.82 1.10(H)  Sodium  135 - 145 mmol/L 138 - 139  Potassium 3.5 - 5.1 mmol/L 3.4(L) - 3.3(L)  Chloride 101 - 111 mmol/L 102 - 105  CO2 22 - 32 mmol/L 28 - 26  Calcium 8.9 - 10.3 mg/dL 8.7(L) - 8.7(L)  Total Protein 6.5 - 8.1 g/dL - - 7.5  Total Bilirubin 0.3 - 1.2 mg/dL - - 1.1  Alkaline Phos 38 - 126 U/L - - 122  AST 15 - 41 U/L - - 21  ALT 14 - 54 U/L - - 17     Lab Results  Component Value Date   WBC 13.4 (H) 01/26/2015   HGB 13.2 01/26/2015   HCT 39.6 01/26/2015   MCV 94.5 01/26/2015   PLT 189 01/26/2015   NEUTROABS 4.9 01/20/2015    ASSESSMENT & PLAN:  No problem-specific Assessment & Plan notes found for this encounter.    No orders of the defined types were placed in this encounter.  The patient has a good understanding of the overall plan. she agrees with it. she will call with any problems that may develop before the next visit here.  Total time spent: *** mins including face to face time and time spent for planning, charting and coordination of care  Rulon Eisenmenger, MD, MPH 07/07/2021  I, Thana Ates, am acting as scribe for Dr. Nicholas Lose.  {insert scribe attestation}

## 2021-07-08 ENCOUNTER — Inpatient Hospital Stay: Payer: Medicare HMO | Attending: Hematology and Oncology | Admitting: Hematology and Oncology

## 2021-07-08 NOTE — Assessment & Plan Note (Deleted)
Right breast invasive ductal carcinoma 3.6 cm by MRI with enlarged right axillary lymph node biopsy proven to be positive for cancer , T2 N1 M0 stage 2B clinical stage.ER positive PR positive HER-2 negative Ki-67 22% Right lumpectomy07/25/2016: IDC, positive for LVI, 5/12 lymph nodes positive with extracapsular extension, grade 1, multifocal 4 cm, 0.5 cm size E 100%, PR 97%, HER-2 negative, Ki-67 22%, T2 N2 stage IIIa Adjuvant radiation therapy 03/23/2015 to 05/06/2015  Current treatment: Anastrozole 1 mg daily started 05/19/2015 Anastrozole toxicities: tolerating it extremely well without any side effects. I recommended that Marissa Haney continue with anastrozole for at least 10 years based upon very high risk of recurrence.  Hypertension:Continues to remain high.  Marissa Haney attributes this to rushing into come to see [place].  Marissa Haney tells me that her blood pressure is not usually that high.   Breast Cancer Surveillance: 1. Breast exam1/11/2021: Normal 2. Mammogram9/23/2021 benign, breast Density Category A  We will need to order new mammograms.  Left shoulder surgery 05/30/2017  Marissa Haney is currently working to take care of her 67 year old gentleman.  Marissa Haney works in [place]. Return to clinic in1 yrfor follow up

## 2021-07-14 DIAGNOSIS — R011 Cardiac murmur, unspecified: Secondary | ICD-10-CM | POA: Diagnosis not present

## 2021-07-14 DIAGNOSIS — R6889 Other general symptoms and signs: Secondary | ICD-10-CM | POA: Diagnosis not present

## 2021-07-14 DIAGNOSIS — J069 Acute upper respiratory infection, unspecified: Secondary | ICD-10-CM | POA: Diagnosis not present

## 2021-07-14 DIAGNOSIS — Z6838 Body mass index (BMI) 38.0-38.9, adult: Secondary | ICD-10-CM | POA: Diagnosis not present

## 2021-07-14 DIAGNOSIS — G4700 Insomnia, unspecified: Secondary | ICD-10-CM | POA: Diagnosis not present

## 2021-08-27 DIAGNOSIS — E1169 Type 2 diabetes mellitus with other specified complication: Secondary | ICD-10-CM | POA: Diagnosis not present

## 2021-08-27 DIAGNOSIS — Z1331 Encounter for screening for depression: Secondary | ICD-10-CM | POA: Diagnosis not present

## 2021-08-27 DIAGNOSIS — R0683 Snoring: Secondary | ICD-10-CM | POA: Diagnosis not present

## 2021-08-27 DIAGNOSIS — G4700 Insomnia, unspecified: Secondary | ICD-10-CM | POA: Diagnosis not present

## 2021-08-27 DIAGNOSIS — Z6838 Body mass index (BMI) 38.0-38.9, adult: Secondary | ICD-10-CM | POA: Diagnosis not present

## 2021-08-27 DIAGNOSIS — I10 Essential (primary) hypertension: Secondary | ICD-10-CM | POA: Diagnosis not present

## 2021-08-27 DIAGNOSIS — E7800 Pure hypercholesterolemia, unspecified: Secondary | ICD-10-CM | POA: Diagnosis not present

## 2021-09-13 DIAGNOSIS — R0602 Shortness of breath: Secondary | ICD-10-CM | POA: Diagnosis not present

## 2021-09-13 DIAGNOSIS — G4733 Obstructive sleep apnea (adult) (pediatric): Secondary | ICD-10-CM | POA: Diagnosis not present

## 2021-09-14 DIAGNOSIS — R0602 Shortness of breath: Secondary | ICD-10-CM | POA: Diagnosis not present

## 2021-09-14 DIAGNOSIS — G4733 Obstructive sleep apnea (adult) (pediatric): Secondary | ICD-10-CM | POA: Diagnosis not present

## 2021-09-22 ENCOUNTER — Other Ambulatory Visit: Payer: Self-pay | Admitting: Hematology and Oncology

## 2021-09-22 DIAGNOSIS — Z170 Estrogen receptor positive status [ER+]: Secondary | ICD-10-CM

## 2021-09-23 ENCOUNTER — Telehealth: Payer: Self-pay | Admitting: Hematology and Oncology

## 2021-09-23 NOTE — Telephone Encounter (Signed)
.  Called patient to schedule appointment per 3/22 inbasket, patient is aware of date and time.   ?

## 2021-10-19 NOTE — Progress Notes (Incomplete)
? ?Patient Care Team: ?Fae Pippin as PCP - General (Physician Assistant) ?Nicholas Lose, MD as Consulting Physician (Hematology and Oncology) ?Fanny Skates, MD as Consulting Physician (General Surgery) ?Kyung Rudd, MD as Consulting Physician (Radiation Oncology) ?Sylvan Cheese, NP as Nurse Practitioner (Hematology and Oncology) ? ?DIAGNOSIS: No diagnosis found. ? ?SUMMARY OF ONCOLOGIC HISTORY: ?Oncology History  ?Breast cancer of upper-outer quadrant of right female breast ([place])  ?05/27/2014 Mammogram  ? Right breast mass by mammogram 5.4 cm by ultrasound 4 x 4 X 2 cm ? ?  ?05/28/2014 Initial Biopsy  ? Right breast biopsy 11:30: Invasive ductal carcinoma with lymphovascular invasion grade 1/2, ER 100%, PR 97%, Ki-67 22%, HER-2 negative ratio 1.26 average copy #1.7, lymph node biopsy also positive ? ?  ?06/16/2014 Breast MRI  ? right breast cancer: 3.6 cm at 12:00 position mildly enlarged right axillary lymph node: 5 mm oval enhancing mass in the central left breast could be an intramammary lymph node ? ?  ?06/16/2014 Clinical Stage  ? Stage IIB (T2 N1)   ? ?  ?07/10/2014 - 11/11/2014 Neo-Adjuvant Chemotherapy  ? Dose dense Adriamycin and Cytoxan ?4 followed by Abraxane ?12 ? ?  ?07/10/2014 Procedure  ? PREVENT clinical trial (Lipitor vs Placebo) in patients undergoing cardiotoxic chemotherapy ? ?  ?12/08/2014 Breast MRI  ? Mass appears smaller, now measuring 2.3 x 4.9 x 2.7 cm. Previously using the same dimensions mass measured 3.4 x 4.6 x 2.7 cm. Improvement of axill LN ? ?  ?01/26/2015 Definitive Surgery  ? Right lumpectomy: IDC, positive for LVI, 5/12 lymph nodes positive with extracapsular extension, grade 1, multifocal 4 cm, 0.5 cm size E 100%, PR 97%, HER-2 negative, Ki-67 22% ? ?  ?01/26/2015 Pathologic Stage  ? Stage IIIA: T2 N2 ? ?  ?03/23/2015 - 05/06/2015 Radiation Therapy  ? Adjuvant breast radiation [facility]): Right breast 50.4 Gy over 28 fractions; right supraclavicular 50.4 Gy; right  breast boost: 10 Gy over 5 fractions. ? ?  ?05/19/2015 -  Anti-estrogen oral therapy  ? Anastrozole 1 mg daily ? ?  ?06/19/2015 Survivorship  ? Survivorship care plan given to patient ? ?  ? ? ?CHIEF COMPLIANT:  Follow-up of right breast cancer on anastrozole  ?  ? ?INTERVAL HISTORY: Marissa Haney is a ? 66 y.o. with above-mentioned history of right breast cancer who underwent lumpectomy, radiation, and is currently on anastrozole. She presents today for annual follow-up.   ? ?ALLERGIES:  has No Known Allergies. ? ?MEDICATIONS:  ?Current Outpatient Medications  ?Medication Sig Dispense Refill  ? anastrozole (ARIMIDEX) 1 MG tablet TAKE 1 TABLET BY MOUTH EVERY DAY 30 tablet 0  ? lisinopril-hydrochlorothiazide (PRINZIDE,ZESTORETIC) 20-12.5 MG tablet Take 1 tablet 2 (two) times daily by mouth.  5  ? metFORMIN (GLUCOPHAGE) 500 MG tablet Take 1 tablet (500 mg total) by mouth 2 (two) times daily with a meal.    ? ?No current facility-administered medications for this visit.  ? ? ?PHYSICAL EXAMINATION: ?ECOG PERFORMANCE STATUS: {CHL ONC ECOG JQ:[id number]} ? ?There were no vitals filed for this visit. ?There were no vitals filed for this visit. ? ?BREAST:*** No palpable masses or nodules in either right or left breasts. No palpable axillary supraclavicular or infraclavicular adenopathy no breast tenderness or nipple discharge. (exam performed in the presence of a chaperone) ? ?LABORATORY DATA:  ?I have reviewed the data as listed ? ?  Latest Ref Rng & Units 05/22/2017  ?  1:14 PM 01/26/2015  ?  7:06 PM 01/20/2015  ?  10:45 AM  ?CMP  ?Glucose 65 - 99 mg/dL 269    255    ?BUN 6 - 20 mg/dL 15    9    ?Creatinine 0.44 - 1.00 mg/dL 0.90   0.82   1.10    ?Sodium 135 - 145 mmol/L 138    139    ?Potassium 3.5 - 5.1 mmol/L 3.4    3.3    ?Chloride 101 - 111 mmol/L 102    105    ?CO2 22 - 32 mmol/L 28    26    ?Calcium 8.9 - 10.3 mg/dL 8.7    8.7    ?Total Protein 6.5 - 8.1 g/dL   7.5    ?Total Bilirubin 0.3 - 1.2 mg/dL   1.1     ?Alkaline Phos 38 - 126 U/L   122    ?AST 15 - 41 U/L   21    ?ALT 14 - 54 U/L   17    ? ? ?Lab Results  ?Component Value Date  ? WBC 13.4 (H) 01/26/2015  ? HGB 13.2 01/26/2015  ? HCT 39.6 01/26/2015  ? MCV 94.5 01/26/2015  ? PLT 189 01/26/2015  ? NEUTROABS 4.9 01/20/2015  ? ? ?ASSESSMENT & PLAN:  ?No problem-specific Assessment & Plan notes found for this encounter. ? ? ? ?No orders of the defined types were placed in this encounter. ? ?The patient has a good understanding of the overall plan. she agrees with it. she will call with any problems that may develop before the next visit here. ?Total time spent: 30 mins including face to face time and time spent for planning, charting and co-ordination of care ? ? Suzzette Righter, CMA ?10/19/21 ? ? ? I Gardiner Coins am scribing for Dr. Lindi Adie ? ?***  ?

## 2021-10-21 DIAGNOSIS — C50911 Malignant neoplasm of unspecified site of right female breast: Secondary | ICD-10-CM | POA: Diagnosis not present

## 2021-10-22 DIAGNOSIS — G4733 Obstructive sleep apnea (adult) (pediatric): Secondary | ICD-10-CM | POA: Diagnosis not present

## 2021-10-28 DIAGNOSIS — Z0000 Encounter for general adult medical examination without abnormal findings: Secondary | ICD-10-CM | POA: Diagnosis not present

## 2021-10-28 DIAGNOSIS — E669 Obesity, unspecified: Secondary | ICD-10-CM | POA: Diagnosis not present

## 2021-10-28 DIAGNOSIS — Z9181 History of falling: Secondary | ICD-10-CM | POA: Diagnosis not present

## 2021-10-28 DIAGNOSIS — E785 Hyperlipidemia, unspecified: Secondary | ICD-10-CM | POA: Diagnosis not present

## 2021-10-28 DIAGNOSIS — Z1331 Encounter for screening for depression: Secondary | ICD-10-CM | POA: Diagnosis not present

## 2021-11-02 ENCOUNTER — Inpatient Hospital Stay: Payer: Medicare HMO | Admitting: Hematology and Oncology

## 2021-11-02 NOTE — Assessment & Plan Note (Deleted)
Right breast invasive ductal carcinoma 3.6 cm by MRI with enlarged right axillary lymph node biopsy proven to be positive for cancer , T2 N1 M0 stage 2B clinical stage.ER positive PR positive HER-2 negative Ki-67 22% Right lumpectomy07/25/2016: IDC, positive for LVI, 5/12 lymph nodes positive with extracapsular extension, grade 1, multifocal 4 cm, 0.5 cm size E 100%, PR 97%, HER-2 negative, Ki-67 22%, T2 N2 stage IIIa Adjuvant radiation therapy 03/23/2015 to 05/06/2015  Current treatment: Anastrozole 1 mg daily started 05/19/2015 Anastrozole toxicities: tolerating it extremely well without any side effects. I recommended that she continue with anastrozole for at least 10 years based upon very high risk of recurrence.  Hypertension:Continues to remain high.  She attributes this to rushing into come to see [place].  She tells me that her blood pressure is not usually that high.   Breast Cancer Surveillance: 1. Breast exam5/08/2021: Normal 2. Mammogram9/13/2020 postoperative changesBreast Density Category B.  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  She works in [place]. Return to clinic in1 yrfor follow up

## 2021-11-24 NOTE — Progress Notes (Incomplete)
Patient Care Team: Cyndi Bender, PA-C as PCP - General (Physician Assistant) Nicholas Lose, MD as Consulting Physician (Hematology and Oncology) Fanny Skates, MD as Consulting Physician (General Surgery) Kyung Rudd, MD as Consulting Physician (Radiation Oncology) Sylvan Cheese, NP as Nurse Practitioner (Hematology and Oncology)  DIAGNOSIS: No diagnosis found.  SUMMARY OF ONCOLOGIC HISTORY: Oncology History  Breast cancer of upper-outer quadrant of right female breast ([place])  05/27/2014 Mammogram   Right breast mass by mammogram 5.4 cm by ultrasound 4 x 4 X 2 cm    05/28/2014 Initial Biopsy   Right breast biopsy 11:30: Invasive ductal carcinoma with lymphovascular invasion grade 1/2, ER 100%, PR 97%, Ki-67 22%, HER-2 negative ratio 1.26 average copy #1.7, lymph node biopsy also positive    06/16/2014 Breast MRI   right breast cancer: 3.6 cm at 12:00 position mildly enlarged right axillary lymph node: 5 mm oval enhancing mass in the central left breast could be an intramammary lymph node    06/16/2014 Clinical Stage   Stage IIB (T2 N1)      07/10/2014 - 11/11/2014 Neo-Adjuvant Chemotherapy   Dose dense Adriamycin and Cytoxan 4 followed by Abraxane 12    07/10/2014 Procedure   PREVENT clinical trial (Lipitor vs Placebo) in patients undergoing cardiotoxic chemotherapy    12/08/2014 Breast MRI   Mass appears smaller, now measuring 2.3 x 4.9 x 2.7 cm. Previously using the same dimensions mass measured 3.4 x 4.6 x 2.7 cm. Improvement of axill LN    01/26/2015 Definitive Surgery   Right lumpectomy: IDC, positive for LVI, 5/12 lymph nodes positive with extracapsular extension, grade 1, multifocal 4 cm, 0.5 cm size E 100%, PR 97%, HER-2 negative, Ki-67 22%    01/26/2015 Pathologic Stage   Stage IIIA: T2 N2    03/23/2015 - 05/06/2015 Radiation Therapy   Adjuvant breast radiation [facility]): Right breast 50.4 Gy over 28 fractions; right supraclavicular 50.4 Gy; right  breast boost: 10 Gy over 5 fractions.    05/19/2015 -  Anti-estrogen oral therapy   Anastrozole 1 mg daily    06/19/2015 Survivorship   Survivorship care plan given to patient      CHIEF COMPLIANT: Follow-up of right breast cancer on anastrozole   INTERVAL HISTORY: Marissa Haney is a 66 y.o. with above-mentioned history of right breast cancer who underwent lumpectomy, radiation, and is currently on anastrozole. She presents today for annual follow-up.     ALLERGIES:  has No Known Allergies.  MEDICATIONS:  Current Outpatient Medications  Medication Sig Dispense Refill   anastrozole (ARIMIDEX) 1 MG tablet TAKE 1 TABLET BY MOUTH EVERY DAY 30 tablet 0   lisinopril-hydrochlorothiazide (PRINZIDE,ZESTORETIC) 20-12.5 MG tablet Take 1 tablet 2 (two) times daily by mouth.  5   metFORMIN (GLUCOPHAGE) 500 MG tablet Take 1 tablet (500 mg total) by mouth 2 (two) times daily with a meal.     No current facility-administered medications for this visit.    PHYSICAL EXAMINATION: ECOG PERFORMANCE STATUS: {CHL ONC ECOG PS:[id number]}  There were no vitals filed for this visit. There were no vitals filed for this visit.  BREAST:*** No palpable masses or nodules in either right or left breasts. No palpable axillary supraclavicular or infraclavicular adenopathy no breast tenderness or nipple discharge. (exam performed in the presence of a chaperone)  LABORATORY DATA:  I have reviewed the data as listed    Latest Ref Rng & Units 05/22/2017    1:14 PM 01/26/2015    7:06 PM 01/20/2015   10:45  AM  CMP  Glucose 65 - 99 mg/dL 269    255    BUN 6 - 20 mg/dL 15    9    Creatinine 0.44 - 1.00 mg/dL 0.90   0.82   1.10    Sodium 135 - 145 mmol/L 138    139    Potassium 3.5 - 5.1 mmol/L 3.4    3.3    Chloride 101 - 111 mmol/L 102    105    CO2 22 - 32 mmol/L 28    26    Calcium 8.9 - 10.3 mg/dL 8.7    8.7    Total Protein 6.5 - 8.1 g/dL   7.5    Total Bilirubin 0.3 - 1.2 mg/dL   1.1    Alkaline  Phos 38 - 126 U/L   122    AST 15 - 41 U/L   21    ALT 14 - 54 U/L   17      Lab Results  Component Value Date   WBC 13.4 (H) 01/26/2015   HGB 13.2 01/26/2015   HCT 39.6 01/26/2015   MCV 94.5 01/26/2015   PLT 189 01/26/2015   NEUTROABS 4.9 01/20/2015    ASSESSMENT & PLAN:  No problem-specific Assessment & Plan notes found for this encounter.    No orders of the defined types were placed in this encounter.  The patient has a good understanding of the overall plan. she agrees with it. she will call with any problems that may develop before the next visit here. Total time spent: 30 mins including face to face time and time spent for planning, charting and co-ordination of care   Suzzette Righter, [place] 11/24/21    I Gardiner Coins am scribing for Dr. Lindi Adie  ***

## 2021-11-30 ENCOUNTER — Inpatient Hospital Stay: Payer: Medicare HMO | Attending: Hematology and Oncology | Admitting: Hematology and Oncology

## 2021-11-30 DIAGNOSIS — E7800 Pure hypercholesterolemia, unspecified: Secondary | ICD-10-CM | POA: Diagnosis not present

## 2021-11-30 DIAGNOSIS — N1831 Chronic kidney disease, stage 3a: Secondary | ICD-10-CM | POA: Diagnosis not present

## 2021-11-30 DIAGNOSIS — M109 Gout, unspecified: Secondary | ICD-10-CM | POA: Diagnosis not present

## 2021-11-30 DIAGNOSIS — I10 Essential (primary) hypertension: Secondary | ICD-10-CM | POA: Diagnosis not present

## 2021-11-30 DIAGNOSIS — C50911 Malignant neoplasm of unspecified site of right female breast: Secondary | ICD-10-CM | POA: Diagnosis not present

## 2021-11-30 DIAGNOSIS — Z79899 Other long term (current) drug therapy: Secondary | ICD-10-CM | POA: Diagnosis not present

## 2021-11-30 DIAGNOSIS — E1169 Type 2 diabetes mellitus with other specified complication: Secondary | ICD-10-CM | POA: Diagnosis not present

## 2021-11-30 DIAGNOSIS — G4700 Insomnia, unspecified: Secondary | ICD-10-CM | POA: Diagnosis not present

## 2021-11-30 NOTE — Assessment & Plan Note (Deleted)
Right breast invasive ductal carcinoma 3.6 cm by MRI with enlarged right axillary lymph node biopsy proven to be positive for cancer , T2 N1 M0 stage 2B clinical stage.ER positive PR positive HER-2 negative Ki-67 22% Right lumpectomy07/25/2016: IDC, positive for LVI, 5/12 lymph nodes positive with extracapsular extension, grade 1, multifocal 4 cm, 0.5 cm size E 100%, PR 97%, HER-2 negative, Ki-67 22%, T2 N2 stage IIIa Adjuvant radiation therapy 03/23/2015 to 05/06/2015  Current treatment: Anastrozole 1 mg daily started 05/19/2015 (recommended 7 to 10 years) Anastrozole toxicities: tolerating it extremely well without any side effects. I recommended that she continue with anastrozole for at least 10 years based upon very high risk of recurrence.  Hypertension:Continues to remain high.  She attributes this to rushing into come to see [place].  She tells me that her blood pressure is not usually that high.   Breast Cancer Surveillance: 1. Breast exam5/30/2023: Normal 2. Mammogram9/30/2021benign,breast Density Category B.  We will need to order new mammograms.  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  She works in [place]. Return to clinic in1 yrfor follow up

## 2021-12-05 ENCOUNTER — Other Ambulatory Visit: Payer: Self-pay | Admitting: Hematology and Oncology

## 2021-12-05 DIAGNOSIS — C50411 Malignant neoplasm of upper-outer quadrant of right female breast: Secondary | ICD-10-CM

## 2021-12-07 ENCOUNTER — Telehealth: Payer: Self-pay | Admitting: Hematology and Oncology

## 2021-12-07 NOTE — Telephone Encounter (Signed)
Scheduled appointment per 6/5 scheduling message. Patient is aware.

## 2022-01-05 ENCOUNTER — Other Ambulatory Visit: Payer: Self-pay | Admitting: Hematology and Oncology

## 2022-01-05 DIAGNOSIS — C50411 Malignant neoplasm of upper-outer quadrant of right female breast: Secondary | ICD-10-CM

## 2022-02-01 ENCOUNTER — Other Ambulatory Visit: Payer: Self-pay | Admitting: Hematology and Oncology

## 2022-02-01 DIAGNOSIS — C50411 Malignant neoplasm of upper-outer quadrant of right female breast: Secondary | ICD-10-CM

## 2022-02-08 ENCOUNTER — Inpatient Hospital Stay: Payer: Medicare Other | Attending: Hematology and Oncology | Admitting: Hematology and Oncology

## 2022-02-08 NOTE — Assessment & Plan Note (Deleted)
Right breast invasive ductal carcinoma 3.6 cm by MRI with enlarged right axillary lymph node biopsy proven to be positive for cancer , T2 N1 M0 stage 2B clinical stage.ER positive PR positive HER-2 negative Ki-67 22% Right lumpectomy07/25/2016: IDC, positive for LVI, 5/12 lymph nodes positive with extracapsular extension, grade 1, multifocal 4 cm, 0.5 cm size E 100%, PR 97%, HER-2 negative, Ki-67 22%, T2 N2 stage IIIa Adjuvant radiation therapy 03/23/2015 to 05/06/2015  Current treatment: Anastrozole 1 mg daily started 05/19/2015 Anastrozole toxicities: tolerating it extremely well without any side effects. I recommended that she continue with anastrozole for at least 10 years based upon very high risk of recurrence.  Hypertension:Continues to remain high.  She attributes this to rushing into come to see [place].  She tells me that her blood pressure is not usually that high.   Breast Cancer Surveillance: 1. Breast exam8/02/2022: Normal 2. Mammogram9/13/2021: Benign breast density category 8.  She will need a new mammogram ordered.  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  She works in [place]. Return to clinic in1 yrfor follow up

## 2022-03-18 ENCOUNTER — Other Ambulatory Visit: Payer: Self-pay | Admitting: Family Medicine

## 2022-03-18 DIAGNOSIS — C50911 Malignant neoplasm of unspecified site of right female breast: Secondary | ICD-10-CM

## 2022-03-21 ENCOUNTER — Other Ambulatory Visit: Payer: Self-pay | Admitting: Family Medicine

## 2022-03-21 DIAGNOSIS — C50911 Malignant neoplasm of unspecified site of right female breast: Secondary | ICD-10-CM

## 2022-03-28 ENCOUNTER — Other Ambulatory Visit: Payer: Self-pay | Admitting: Family Medicine

## 2022-03-28 DIAGNOSIS — Z1231 Encounter for screening mammogram for malignant neoplasm of breast: Secondary | ICD-10-CM

## 2022-04-25 ENCOUNTER — Ambulatory Visit: Payer: Medicare Other

## 2022-06-21 ENCOUNTER — Ambulatory Visit: Payer: Medicare Other

## 2022-07-11 DIAGNOSIS — N182 Chronic kidney disease, stage 2 (mild): Secondary | ICD-10-CM | POA: Diagnosis not present

## 2022-07-11 DIAGNOSIS — E7800 Pure hypercholesterolemia, unspecified: Secondary | ICD-10-CM | POA: Diagnosis not present

## 2022-07-11 DIAGNOSIS — E1169 Type 2 diabetes mellitus with other specified complication: Secondary | ICD-10-CM | POA: Diagnosis not present

## 2022-07-11 DIAGNOSIS — E538 Deficiency of other specified B group vitamins: Secondary | ICD-10-CM | POA: Diagnosis not present

## 2022-07-11 DIAGNOSIS — M109 Gout, unspecified: Secondary | ICD-10-CM | POA: Diagnosis not present

## 2022-07-11 DIAGNOSIS — I10 Essential (primary) hypertension: Secondary | ICD-10-CM | POA: Diagnosis not present

## 2022-07-11 DIAGNOSIS — G4700 Insomnia, unspecified: Secondary | ICD-10-CM | POA: Diagnosis not present

## 2022-07-14 DIAGNOSIS — E538 Deficiency of other specified B group vitamins: Secondary | ICD-10-CM | POA: Diagnosis not present

## 2022-07-21 ENCOUNTER — Telehealth: Payer: Self-pay

## 2022-07-21 NOTE — Patient Outreach (Signed)
  Care Coordination   Initial Visit Note   07/21/2022 Name: Marissa Haney MRN: [id number] DOB: 09-Mar-1956  Marissa Haney is a 67 y.o. year old female who sees Cyndi Bender, [place] for primary care. I spoke with  Marissa Haney by phone today.  What matters to the patients health and wellness today?  Placed call to patient to review and offer [facility] care coordination program. Patient reports she is doing well and denies any needs at this time.    SDOH assessments and interventions completed:  No     Care Coordination Interventions:  No, not indicated   Follow up plan: No further intervention required.   Encounter Outcome:  Pt. Refused   Tomasa Rand, RN, BSN, CEN [facility] [employer] [phone]

## 2022-08-01 NOTE — Progress Notes (Signed)
Patient Care Team: Cyndi Bender, PA-C as PCP - General (Physician Assistant) Nicholas Lose, MD as Consulting Physician (Hematology and Oncology) Fanny Skates, MD as Consulting Physician (General Surgery) Kyung Rudd, MD as Consulting Physician (Radiation Oncology) Sylvan Cheese, NP as Nurse Practitioner (Hematology and Oncology)  DIAGNOSIS: No diagnosis found.  SUMMARY OF ONCOLOGIC HISTORY: Oncology History  Breast cancer of upper-outer quadrant of right female breast (Marissa Haney)  05/27/2014 Mammogram   Right breast mass by mammogram 5.4 cm by ultrasound 4 x 4 X 2 cm   05/28/2014 Initial Biopsy   Right breast biopsy 11:30: Invasive ductal carcinoma with lymphovascular invasion grade 1/2, ER 100%, PR 97%, Ki-67 22%, HER-2 negative ratio 1.26 average copy #1.7, lymph node biopsy also positive   06/16/2014 Breast MRI   right breast cancer: 3.6 cm at 12:00 position mildly enlarged right axillary lymph node: 5 mm oval enhancing mass in the central left breast could be an intramammary lymph node   06/16/2014 Clinical Stage   Stage IIB (T2 N1)     07/10/2014 - 11/11/2014 Neo-Adjuvant Chemotherapy   Dose dense Adriamycin and Cytoxan 4 followed by Abraxane 12   07/10/2014 Procedure   PREVENT clinical trial (Lipitor vs Placebo) in patients undergoing cardiotoxic chemotherapy   12/08/2014 Breast MRI   Mass appears smaller, now measuring 2.3 x 4.9 x 2.7 cm. Previously using the same dimensions mass measured 3.4 x 4.6 x 2.7 cm. Improvement of axill LN   01/26/2015 Definitive Surgery   Right lumpectomy: IDC, positive for LVI, 5/12 lymph nodes positive with extracapsular extension, grade 1, multifocal 4 cm, 0.5 cm size E 100%, PR 97%, HER-2 negative, Ki-67 22%   01/26/2015 Pathologic Stage   Stage IIIA: T2 N2   03/23/2015 - 05/06/2015 Radiation Therapy   Adjuvant breast radiation Marissa Haney): Right breast 50.4 Gy over 28 fractions; right supraclavicular 50.4 Gy; right breast boost: 10 Gy  over 5 fractions.   05/19/2015 -  Anti-estrogen oral therapy   Anastrozole 1 mg daily   06/19/2015 Survivorship   Survivorship care plan given to patient     CHIEF COMPLIANT:   INTERVAL HISTORY: Marissa Haney is a   ALLERGIES:  has No Known Allergies.  MEDICATIONS:  Current Outpatient Medications  Medication Sig Dispense Refill   anastrozole (ARIMIDEX) 1 MG tablet TAKE 1 TABLET BY MOUTH EVERY DAY 90 tablet 3   lisinopril-hydrochlorothiazide (PRINZIDE,ZESTORETIC) 20-12.5 MG tablet Take 1 tablet 2 (two) times daily by mouth.  5   metFORMIN (GLUCOPHAGE) 500 MG tablet Take 1 tablet (500 mg total) by mouth 2 (two) times daily with a meal.     No current facility-administered medications for this visit.    PHYSICAL EXAMINATION: ECOG PERFORMANCE STATUS: {CHL ONC ECOG PS:[id number]}  There were no vitals filed for this visit. There were no vitals filed for this visit.  BREAST:*** No palpable masses or nodules in either right or left breasts. No palpable axillary supraclavicular or infraclavicular adenopathy no breast tenderness or nipple discharge. (exam performed in the presence of a chaperone)  LABORATORY DATA:  Marissa have reviewed the data as listed    Latest Ref Rng & Units 05/22/2017    1:14 PM 01/26/2015    7:06 PM 01/20/2015   10:45 AM  CMP  Glucose 65 - 99 mg/dL 269   255   BUN 6 - 20 mg/dL 15   9   Creatinine 0.44 - 1.00 mg/dL 0.90  0.82  1.10   Sodium 135 - 145 mmol/L 138  139   Potassium 3.5 - 5.1 mmol/L 3.4   3.3   Chloride 101 - 111 mmol/L 102   105   CO2 22 - 32 mmol/L 28   26   Calcium 8.9 - 10.3 mg/dL 8.7   8.7   Total Protein 6.5 - 8.1 g/dL   7.5   Total Bilirubin 0.3 - 1.2 mg/dL   1.1   Alkaline Phos 38 - 126 U/L   122   AST 15 - 41 U/L   21   ALT 14 - 54 U/L   17     Lab Results  Component Value Date   WBC 13.4 (H) 01/26/2015   HGB 13.2 01/26/2015   HCT 39.6 01/26/2015   MCV 94.5 01/26/2015   PLT 189 01/26/2015   NEUTROABS 4.9 01/20/2015     ASSESSMENT & PLAN:  No problem-specific Assessment & Plan notes found for this encounter.    No orders of the defined types were placed in this encounter.  The patient has a good understanding of the overall plan. she agrees with it. she will call with any problems that may develop before the next visit here. Total time spent: 30 mins including face to face time and time spent for planning, charting and co-ordination of care   Marissa Haney, Marissa Haney 08/01/22    Marissa Haney am acting as a Education administrator for [employer]  ***

## 2022-08-02 ENCOUNTER — Inpatient Hospital Stay: Payer: Medicare Other | Attending: Hematology and Oncology | Admitting: Hematology and Oncology

## 2022-08-02 ENCOUNTER — Other Ambulatory Visit: Payer: Self-pay

## 2022-08-02 VITALS — BP 236/80 | HR 81 | Temp 97.2°F | Resp 17 | Wt 235.3 lb

## 2022-08-02 DIAGNOSIS — Z79899 Other long term (current) drug therapy: Secondary | ICD-10-CM | POA: Diagnosis not present

## 2022-08-02 DIAGNOSIS — I10 Essential (primary) hypertension: Secondary | ICD-10-CM | POA: Diagnosis not present

## 2022-08-02 DIAGNOSIS — Z170 Estrogen receptor positive status [ER+]: Secondary | ICD-10-CM | POA: Insufficient documentation

## 2022-08-02 DIAGNOSIS — Z79811 Long term (current) use of aromatase inhibitors: Secondary | ICD-10-CM | POA: Insufficient documentation

## 2022-08-02 DIAGNOSIS — C50411 Malignant neoplasm of upper-outer quadrant of right female breast: Secondary | ICD-10-CM | POA: Insufficient documentation

## 2022-08-02 DIAGNOSIS — Z923 Personal history of irradiation: Secondary | ICD-10-CM | POA: Insufficient documentation

## 2022-08-02 MED ORDER — ANASTROZOLE 1 MG PO TABS: 1.0000 mg | ORAL_TABLET | Freq: Every day | ORAL | 3 refills | Status: DC

## 2022-08-02 NOTE — Assessment & Plan Note (Signed)
Right breast invasive ductal carcinoma 3.6 cm by MRI with enlarged right axillary lymph node biopsy proven to be positive for cancer , T2 N1 M0 stage 2B clinical stage.ER positive PR positive HER-2 negative Ki-67 22% Right lumpectomy 01/26/2015: IDC, positive for LVI, 5/12 lymph nodes positive with extracapsular extension, grade 1, multifocal 4 cm, 0.5 cm size E 100%, PR 97%, HER-2 negative, Ki-67 22%, T2 N2 stage IIIa Adjuvant radiation therapy 03/23/2015 to 05/06/2015   Current treatment: Anastrozole 1 mg daily started 05/19/2015 Anastrozole toxicities: tolerating it extremely well without any side effects. I recommended that she continue with anastrozole for at least 10 years based upon very high risk of recurrence.   Hypertension: Continues to remain high.  She attributes this to rushing into come to see [place].  She tells me that her blood pressure is not usually that high.     Breast Cancer Surveillance: 1. Breast exam 08/02/2022: Normal 2. 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  She works in [place]. Return to clinic in 1 yr for follow up

## 2022-08-04 ENCOUNTER — Telehealth: Payer: Self-pay | Admitting: Hematology and Oncology

## 2022-08-04 NOTE — Telephone Encounter (Signed)
Scheduled appointment per 1/30 los. Left voicemail.

## 2022-08-15 DIAGNOSIS — E538 Deficiency of other specified B group vitamins: Secondary | ICD-10-CM | POA: Diagnosis not present

## 2022-08-31 ENCOUNTER — Ambulatory Visit: Payer: Medicare Other

## 2022-09-01 ENCOUNTER — Ambulatory Visit: Payer: Medicare Other

## 2022-09-09 DIAGNOSIS — H2513 Age-related nuclear cataract, bilateral: Secondary | ICD-10-CM | POA: Diagnosis not present

## 2022-09-09 DIAGNOSIS — E119 Type 2 diabetes mellitus without complications: Secondary | ICD-10-CM | POA: Diagnosis not present

## 2022-09-15 DIAGNOSIS — E538 Deficiency of other specified B group vitamins: Secondary | ICD-10-CM | POA: Diagnosis not present

## 2022-10-14 ENCOUNTER — Inpatient Hospital Stay: Admission: RE | Admit: 2022-10-14 | Payer: Medicare Other | Source: Ambulatory Visit

## 2022-10-18 ENCOUNTER — Ambulatory Visit
Admission: RE | Admit: 2022-10-18 | Discharge: 2022-10-18 | Disposition: A | Discharge status: Home / Self Care | Payer: Medicare Other | Source: Ambulatory Visit | Attending: Family Medicine | Admitting: Family Medicine

## 2022-10-18 DIAGNOSIS — Z1231 Encounter for screening mammogram for malignant neoplasm of breast: Secondary | ICD-10-CM

## 2022-11-11 DIAGNOSIS — M109 Gout, unspecified: Secondary | ICD-10-CM | POA: Diagnosis not present

## 2022-11-11 DIAGNOSIS — C50911 Malignant neoplasm of unspecified site of right female breast: Secondary | ICD-10-CM | POA: Diagnosis not present

## 2022-11-11 DIAGNOSIS — E538 Deficiency of other specified B group vitamins: Secondary | ICD-10-CM | POA: Diagnosis not present

## 2022-11-11 DIAGNOSIS — E1169 Type 2 diabetes mellitus with other specified complication: Secondary | ICD-10-CM | POA: Diagnosis not present

## 2022-11-11 DIAGNOSIS — N182 Chronic kidney disease, stage 2 (mild): Secondary | ICD-10-CM | POA: Diagnosis not present

## 2022-11-11 DIAGNOSIS — Z9181 History of falling: Secondary | ICD-10-CM | POA: Diagnosis not present

## 2022-11-11 DIAGNOSIS — I10 Essential (primary) hypertension: Secondary | ICD-10-CM | POA: Diagnosis not present

## 2022-11-11 DIAGNOSIS — E7800 Pure hypercholesterolemia, unspecified: Secondary | ICD-10-CM | POA: Diagnosis not present

## 2022-11-11 DIAGNOSIS — G4700 Insomnia, unspecified: Secondary | ICD-10-CM | POA: Diagnosis not present

## 2022-12-05 DIAGNOSIS — R5383 Other fatigue: Secondary | ICD-10-CM | POA: Diagnosis not present

## 2022-12-05 DIAGNOSIS — E1169 Type 2 diabetes mellitus with other specified complication: Secondary | ICD-10-CM | POA: Diagnosis not present

## 2022-12-12 DIAGNOSIS — E538 Deficiency of other specified B group vitamins: Secondary | ICD-10-CM | POA: Diagnosis not present

## 2022-12-21 DIAGNOSIS — M7061 Trochanteric bursitis, right hip: Secondary | ICD-10-CM | POA: Diagnosis not present

## 2022-12-21 DIAGNOSIS — M25561 Pain in right knee: Secondary | ICD-10-CM | POA: Diagnosis not present

## 2022-12-26 DIAGNOSIS — M25561 Pain in right knee: Secondary | ICD-10-CM | POA: Diagnosis not present

## 2022-12-29 DIAGNOSIS — M25561 Pain in right knee: Secondary | ICD-10-CM | POA: Diagnosis not present

## 2023-01-11 DIAGNOSIS — M25561 Pain in right knee: Secondary | ICD-10-CM | POA: Diagnosis not present

## 2023-01-12 DIAGNOSIS — M25561 Pain in right knee: Secondary | ICD-10-CM | POA: Diagnosis not present

## 2023-01-19 DIAGNOSIS — M25561 Pain in right knee: Secondary | ICD-10-CM | POA: Diagnosis not present

## 2023-01-19 DIAGNOSIS — M7631 Iliotibial band syndrome, right leg: Secondary | ICD-10-CM | POA: Diagnosis not present

## 2023-01-31 DIAGNOSIS — R2689 Other abnormalities of gait and mobility: Secondary | ICD-10-CM | POA: Diagnosis not present

## 2023-01-31 DIAGNOSIS — M25561 Pain in right knee: Secondary | ICD-10-CM | POA: Diagnosis not present

## 2023-01-31 DIAGNOSIS — M25661 Stiffness of right knee, not elsewhere classified: Secondary | ICD-10-CM | POA: Diagnosis not present

## 2023-02-14 DIAGNOSIS — M6281 Muscle weakness (generalized): Secondary | ICD-10-CM | POA: Diagnosis not present

## 2023-02-14 DIAGNOSIS — M7631 Iliotibial band syndrome, right leg: Secondary | ICD-10-CM | POA: Diagnosis not present

## 2023-02-14 DIAGNOSIS — M79671 Pain in right foot: Secondary | ICD-10-CM | POA: Diagnosis not present

## 2023-02-16 DIAGNOSIS — M7631 Iliotibial band syndrome, right leg: Secondary | ICD-10-CM | POA: Diagnosis not present

## 2023-02-16 DIAGNOSIS — M79671 Pain in right foot: Secondary | ICD-10-CM | POA: Diagnosis not present

## 2023-02-16 DIAGNOSIS — M6281 Muscle weakness (generalized): Secondary | ICD-10-CM | POA: Diagnosis not present

## 2023-02-21 DIAGNOSIS — M7631 Iliotibial band syndrome, right leg: Secondary | ICD-10-CM | POA: Diagnosis not present

## 2023-02-21 DIAGNOSIS — M79671 Pain in right foot: Secondary | ICD-10-CM | POA: Diagnosis not present

## 2023-02-21 DIAGNOSIS — M6281 Muscle weakness (generalized): Secondary | ICD-10-CM | POA: Diagnosis not present

## 2023-02-23 DIAGNOSIS — M7631 Iliotibial band syndrome, right leg: Secondary | ICD-10-CM | POA: Diagnosis not present

## 2023-02-23 DIAGNOSIS — M6281 Muscle weakness (generalized): Secondary | ICD-10-CM | POA: Diagnosis not present

## 2023-02-23 DIAGNOSIS — M79671 Pain in right foot: Secondary | ICD-10-CM | POA: Diagnosis not present

## 2023-02-28 ENCOUNTER — Other Ambulatory Visit: Payer: Self-pay | Admitting: *Deleted

## 2023-02-28 DIAGNOSIS — C50411 Malignant neoplasm of upper-outer quadrant of right female breast: Secondary | ICD-10-CM

## 2023-02-28 MED ORDER — ANASTROZOLE 1 MG PO TABS: 1.0000 mg | ORAL_TABLET | Freq: Every day | ORAL | 3 refills | Status: DC

## 2023-03-14 DIAGNOSIS — E538 Deficiency of other specified B group vitamins: Secondary | ICD-10-CM | POA: Diagnosis not present

## 2023-04-13 DIAGNOSIS — E538 Deficiency of other specified B group vitamins: Secondary | ICD-10-CM | POA: Diagnosis not present

## 2023-04-21 DIAGNOSIS — C50911 Malignant neoplasm of unspecified site of right female breast: Secondary | ICD-10-CM | POA: Diagnosis not present

## 2023-04-27 DIAGNOSIS — N182 Chronic kidney disease, stage 2 (mild): Secondary | ICD-10-CM | POA: Diagnosis not present

## 2023-04-27 DIAGNOSIS — C50911 Malignant neoplasm of unspecified site of right female breast: Secondary | ICD-10-CM | POA: Diagnosis not present

## 2023-04-27 DIAGNOSIS — E538 Deficiency of other specified B group vitamins: Secondary | ICD-10-CM | POA: Diagnosis not present

## 2023-04-27 DIAGNOSIS — M109 Gout, unspecified: Secondary | ICD-10-CM | POA: Diagnosis not present

## 2023-04-27 DIAGNOSIS — G4700 Insomnia, unspecified: Secondary | ICD-10-CM | POA: Diagnosis not present

## 2023-04-27 DIAGNOSIS — E1169 Type 2 diabetes mellitus with other specified complication: Secondary | ICD-10-CM | POA: Diagnosis not present

## 2023-04-27 DIAGNOSIS — I10 Essential (primary) hypertension: Secondary | ICD-10-CM | POA: Diagnosis not present

## 2023-04-27 DIAGNOSIS — E7800 Pure hypercholesterolemia, unspecified: Secondary | ICD-10-CM | POA: Diagnosis not present

## 2023-05-02 DIAGNOSIS — C50911 Malignant neoplasm of unspecified site of right female breast: Secondary | ICD-10-CM | POA: Diagnosis not present

## 2023-05-04 DIAGNOSIS — C50911 Malignant neoplasm of unspecified site of right female breast: Secondary | ICD-10-CM | POA: Diagnosis not present

## 2023-05-25 DIAGNOSIS — Z9181 History of falling: Secondary | ICD-10-CM | POA: Diagnosis not present

## 2023-05-25 DIAGNOSIS — Z139 Encounter for screening, unspecified: Secondary | ICD-10-CM | POA: Diagnosis not present

## 2023-05-25 DIAGNOSIS — Z0000 Encounter for general adult medical examination without abnormal findings: Secondary | ICD-10-CM | POA: Diagnosis not present

## 2023-08-03 ENCOUNTER — Inpatient Hospital Stay: Payer: Medicare HMO | Attending: Hematology and Oncology | Admitting: Hematology and Oncology

## 2023-08-17 DIAGNOSIS — M7918 Myalgia, other site: Secondary | ICD-10-CM | POA: Diagnosis not present

## 2023-08-17 DIAGNOSIS — R6889 Other general symptoms and signs: Secondary | ICD-10-CM | POA: Diagnosis not present

## 2023-08-24 DIAGNOSIS — E538 Deficiency of other specified B group vitamins: Secondary | ICD-10-CM | POA: Diagnosis not present

## 2023-08-31 DIAGNOSIS — M109 Gout, unspecified: Secondary | ICD-10-CM | POA: Diagnosis not present

## 2023-08-31 DIAGNOSIS — E538 Deficiency of other specified B group vitamins: Secondary | ICD-10-CM | POA: Diagnosis not present

## 2023-08-31 DIAGNOSIS — G4700 Insomnia, unspecified: Secondary | ICD-10-CM | POA: Diagnosis not present

## 2023-08-31 DIAGNOSIS — E1169 Type 2 diabetes mellitus with other specified complication: Secondary | ICD-10-CM | POA: Diagnosis not present

## 2023-08-31 DIAGNOSIS — C50911 Malignant neoplasm of unspecified site of right female breast: Secondary | ICD-10-CM | POA: Diagnosis not present

## 2023-08-31 DIAGNOSIS — N182 Chronic kidney disease, stage 2 (mild): Secondary | ICD-10-CM | POA: Diagnosis not present

## 2023-08-31 DIAGNOSIS — E7800 Pure hypercholesterolemia, unspecified: Secondary | ICD-10-CM | POA: Diagnosis not present

## 2023-08-31 DIAGNOSIS — I10 Essential (primary) hypertension: Secondary | ICD-10-CM | POA: Diagnosis not present

## 2023-09-21 DIAGNOSIS — J069 Acute upper respiratory infection, unspecified: Secondary | ICD-10-CM | POA: Diagnosis not present

## 2023-09-21 DIAGNOSIS — R6889 Other general symptoms and signs: Secondary | ICD-10-CM | POA: Diagnosis not present

## 2023-10-16 DIAGNOSIS — E538 Deficiency of other specified B group vitamins: Secondary | ICD-10-CM | POA: Diagnosis not present

## 2023-10-17 DIAGNOSIS — M79652 Pain in left thigh: Secondary | ICD-10-CM | POA: Diagnosis not present

## 2023-10-24 DIAGNOSIS — M79652 Pain in left thigh: Secondary | ICD-10-CM | POA: Diagnosis not present

## 2023-10-26 DIAGNOSIS — C50911 Malignant neoplasm of unspecified site of right female breast: Secondary | ICD-10-CM | POA: Diagnosis not present

## 2023-11-16 DIAGNOSIS — E538 Deficiency of other specified B group vitamins: Secondary | ICD-10-CM | POA: Diagnosis not present

## 2023-12-09 ENCOUNTER — Other Ambulatory Visit: Payer: Self-pay | Admitting: Hematology and Oncology

## 2023-12-09 DIAGNOSIS — Z170 Estrogen receptor positive status [ER+]: Secondary | ICD-10-CM

## 2023-12-18 DIAGNOSIS — E538 Deficiency of other specified B group vitamins: Secondary | ICD-10-CM | POA: Diagnosis not present

## 2023-12-21 ENCOUNTER — Other Ambulatory Visit: Payer: Self-pay | Admitting: Family Medicine

## 2023-12-21 DIAGNOSIS — Z1231 Encounter for screening mammogram for malignant neoplasm of breast: Secondary | ICD-10-CM

## 2023-12-27 ENCOUNTER — Ambulatory Visit

## 2024-01-09 DIAGNOSIS — G4700 Insomnia, unspecified: Secondary | ICD-10-CM | POA: Diagnosis not present

## 2024-01-09 DIAGNOSIS — C50911 Malignant neoplasm of unspecified site of right female breast: Secondary | ICD-10-CM | POA: Diagnosis not present

## 2024-01-09 DIAGNOSIS — E538 Deficiency of other specified B group vitamins: Secondary | ICD-10-CM | POA: Diagnosis not present

## 2024-01-09 DIAGNOSIS — Z1159 Encounter for screening for other viral diseases: Secondary | ICD-10-CM | POA: Diagnosis not present

## 2024-01-09 DIAGNOSIS — E1169 Type 2 diabetes mellitus with other specified complication: Secondary | ICD-10-CM | POA: Diagnosis not present

## 2024-01-09 DIAGNOSIS — N182 Chronic kidney disease, stage 2 (mild): Secondary | ICD-10-CM | POA: Diagnosis not present

## 2024-01-09 DIAGNOSIS — E7800 Pure hypercholesterolemia, unspecified: Secondary | ICD-10-CM | POA: Diagnosis not present

## 2024-01-09 DIAGNOSIS — M109 Gout, unspecified: Secondary | ICD-10-CM | POA: Diagnosis not present

## 2024-01-09 DIAGNOSIS — I10 Essential (primary) hypertension: Secondary | ICD-10-CM | POA: Diagnosis not present

## 2024-01-18 ENCOUNTER — Inpatient Hospital Stay
Admission: RE | Admit: 2024-01-18 | Discharge: 2024-01-18 | Discharge status: Home / Self Care | Source: Ambulatory Visit | Attending: Family Medicine | Admitting: Family Medicine

## 2024-01-18 DIAGNOSIS — Z1231 Encounter for screening mammogram for malignant neoplasm of breast: Secondary | ICD-10-CM

## 2024-02-09 DIAGNOSIS — E538 Deficiency of other specified B group vitamins: Secondary | ICD-10-CM | POA: Diagnosis not present

## 2024-03-13 DIAGNOSIS — E538 Deficiency of other specified B group vitamins: Secondary | ICD-10-CM | POA: Diagnosis not present

## 2024-04-15 DIAGNOSIS — E538 Deficiency of other specified B group vitamins: Secondary | ICD-10-CM | POA: Diagnosis not present

## 2024-04-24 DIAGNOSIS — R6889 Other general symptoms and signs: Secondary | ICD-10-CM | POA: Diagnosis not present

## 2024-04-24 DIAGNOSIS — M109 Gout, unspecified: Secondary | ICD-10-CM | POA: Diagnosis not present

## 2024-04-24 DIAGNOSIS — J069 Acute upper respiratory infection, unspecified: Secondary | ICD-10-CM | POA: Diagnosis not present

## 2024-05-13 DIAGNOSIS — E7800 Pure hypercholesterolemia, unspecified: Secondary | ICD-10-CM | POA: Diagnosis not present

## 2024-05-13 DIAGNOSIS — N182 Chronic kidney disease, stage 2 (mild): Secondary | ICD-10-CM | POA: Diagnosis not present

## 2024-05-13 DIAGNOSIS — E1121 Type 2 diabetes mellitus with diabetic nephropathy: Secondary | ICD-10-CM | POA: Diagnosis not present

## 2024-05-13 DIAGNOSIS — I10 Essential (primary) hypertension: Secondary | ICD-10-CM | POA: Diagnosis not present

## 2024-05-13 DIAGNOSIS — C50911 Malignant neoplasm of unspecified site of right female breast: Secondary | ICD-10-CM | POA: Diagnosis not present

## 2024-05-13 DIAGNOSIS — G4700 Insomnia, unspecified: Secondary | ICD-10-CM | POA: Diagnosis not present

## 2024-05-13 DIAGNOSIS — M109 Gout, unspecified: Secondary | ICD-10-CM | POA: Diagnosis not present

## 2024-05-13 DIAGNOSIS — E538 Deficiency of other specified B group vitamins: Secondary | ICD-10-CM | POA: Diagnosis not present
